# Patient Record
Sex: Male | Born: 1960 | Race: White | Hispanic: No | Marital: Married | Smoking: Never smoker
Health system: Southern US, Community
[De-identification: ages and names within clinical notes are randomized; demographics above are authoritative.]

## PROBLEM LIST (undated history)

## (undated) DIAGNOSIS — F329 Major depressive disorder, single episode, unspecified: Secondary | ICD-10-CM

## (undated) DIAGNOSIS — F32A Depression, unspecified: Secondary | ICD-10-CM

## (undated) DIAGNOSIS — M1990 Unspecified osteoarthritis, unspecified site: Secondary | ICD-10-CM

## (undated) DIAGNOSIS — G4730 Sleep apnea, unspecified: Secondary | ICD-10-CM

## (undated) DIAGNOSIS — E669 Obesity, unspecified: Secondary | ICD-10-CM

## (undated) DIAGNOSIS — I10 Essential (primary) hypertension: Secondary | ICD-10-CM

## (undated) HISTORY — DX: Depression, unspecified: F32.A

## (undated) HISTORY — DX: Essential (primary) hypertension: I10

## (undated) HISTORY — DX: Unspecified osteoarthritis, unspecified site: M19.90

## (undated) HISTORY — DX: Obesity, unspecified: E66.9

## (undated) HISTORY — DX: Major depressive disorder, single episode, unspecified: F32.9

## (undated) HISTORY — DX: Sleep apnea, unspecified: G47.30

---

## 2011-05-26 ENCOUNTER — Other Ambulatory Visit: Payer: Self-pay | Admitting: Family Medicine

## 2011-05-26 DIAGNOSIS — R51 Headache: Secondary | ICD-10-CM

## 2011-05-30 ENCOUNTER — Ambulatory Visit
Admission: RE | Admit: 2011-05-30 | Discharge: 2011-05-30 | Disposition: A | Discharge status: Home / Self Care | Payer: 59 | Source: Ambulatory Visit | Attending: Family Medicine | Admitting: Family Medicine

## 2011-05-30 DIAGNOSIS — R51 Headache: Secondary | ICD-10-CM

## 2011-05-30 MED ORDER — IOHEXOL 300 MG/ML  SOLN
75.0000 mL | Freq: Once | INTRAMUSCULAR | Status: AC | PRN
  Administered 2011-05-30: 75 mL via INTRAVENOUS

## 2011-06-29 ENCOUNTER — Ambulatory Visit: Payer: Self-pay | Admitting: Neurology

## 2011-09-20 ENCOUNTER — Other Ambulatory Visit (HOSPITAL_COMMUNITY): Payer: Self-pay | Admitting: Family Medicine

## 2011-09-20 ENCOUNTER — Other Ambulatory Visit: Payer: Self-pay | Admitting: Family Medicine

## 2011-09-20 DIAGNOSIS — Z9884 Bariatric surgery status: Secondary | ICD-10-CM

## 2011-09-27 ENCOUNTER — Ambulatory Visit (HOSPITAL_COMMUNITY)
Admission: RE | Admit: 2011-09-27 | Discharge: 2011-09-27 | Disposition: A | Discharge status: Home / Self Care | Payer: 59 | Source: Ambulatory Visit | Attending: Family Medicine | Admitting: Family Medicine

## 2011-09-27 ENCOUNTER — Other Ambulatory Visit (HOSPITAL_COMMUNITY): Payer: Self-pay | Admitting: Family Medicine

## 2011-09-27 DIAGNOSIS — R109 Unspecified abdominal pain: Secondary | ICD-10-CM | POA: Insufficient documentation

## 2011-09-27 DIAGNOSIS — Z9884 Bariatric surgery status: Secondary | ICD-10-CM

## 2011-09-27 DIAGNOSIS — R635 Abnormal weight gain: Secondary | ICD-10-CM | POA: Insufficient documentation

## 2012-12-20 ENCOUNTER — Other Ambulatory Visit: Payer: Self-pay | Admitting: Physician Assistant

## 2012-12-20 ENCOUNTER — Ambulatory Visit
Admission: RE | Admit: 2012-12-20 | Discharge: 2012-12-20 | Disposition: A | Discharge status: Home / Self Care | Payer: 59 | Source: Ambulatory Visit | Attending: Physician Assistant | Admitting: Physician Assistant

## 2012-12-20 DIAGNOSIS — R52 Pain, unspecified: Secondary | ICD-10-CM

## 2013-04-02 ENCOUNTER — Ambulatory Visit: Payer: 59 | Admitting: Internal Medicine

## 2013-10-30 IMAGING — CR DG KNEE 1-2V*R*
2 series · 2 of 2 positions shown · non-contrast
Comparison: None.

CLINICAL DATA: Joint pain.  Morbid obesity.

RIGHT KNEE - 1-2 VIEW

[view not recorded (1 of 2)]
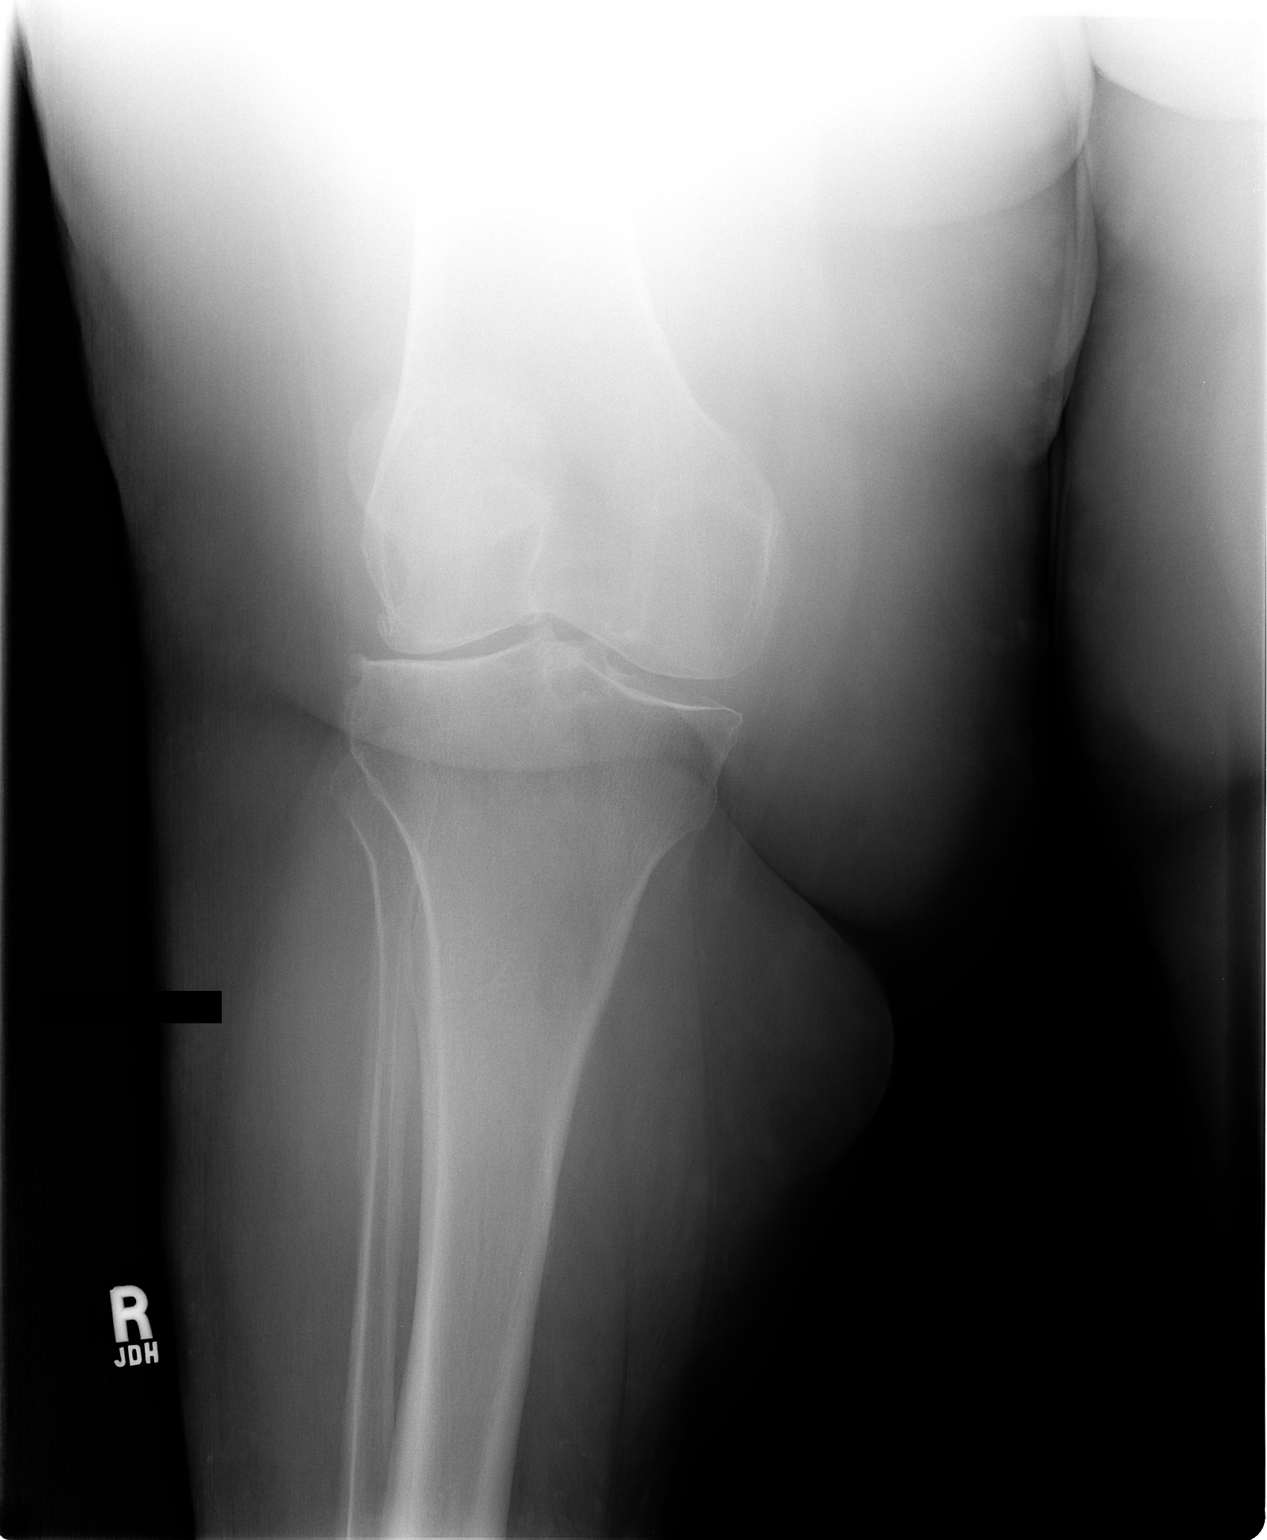

[view not recorded (2 of 2)]
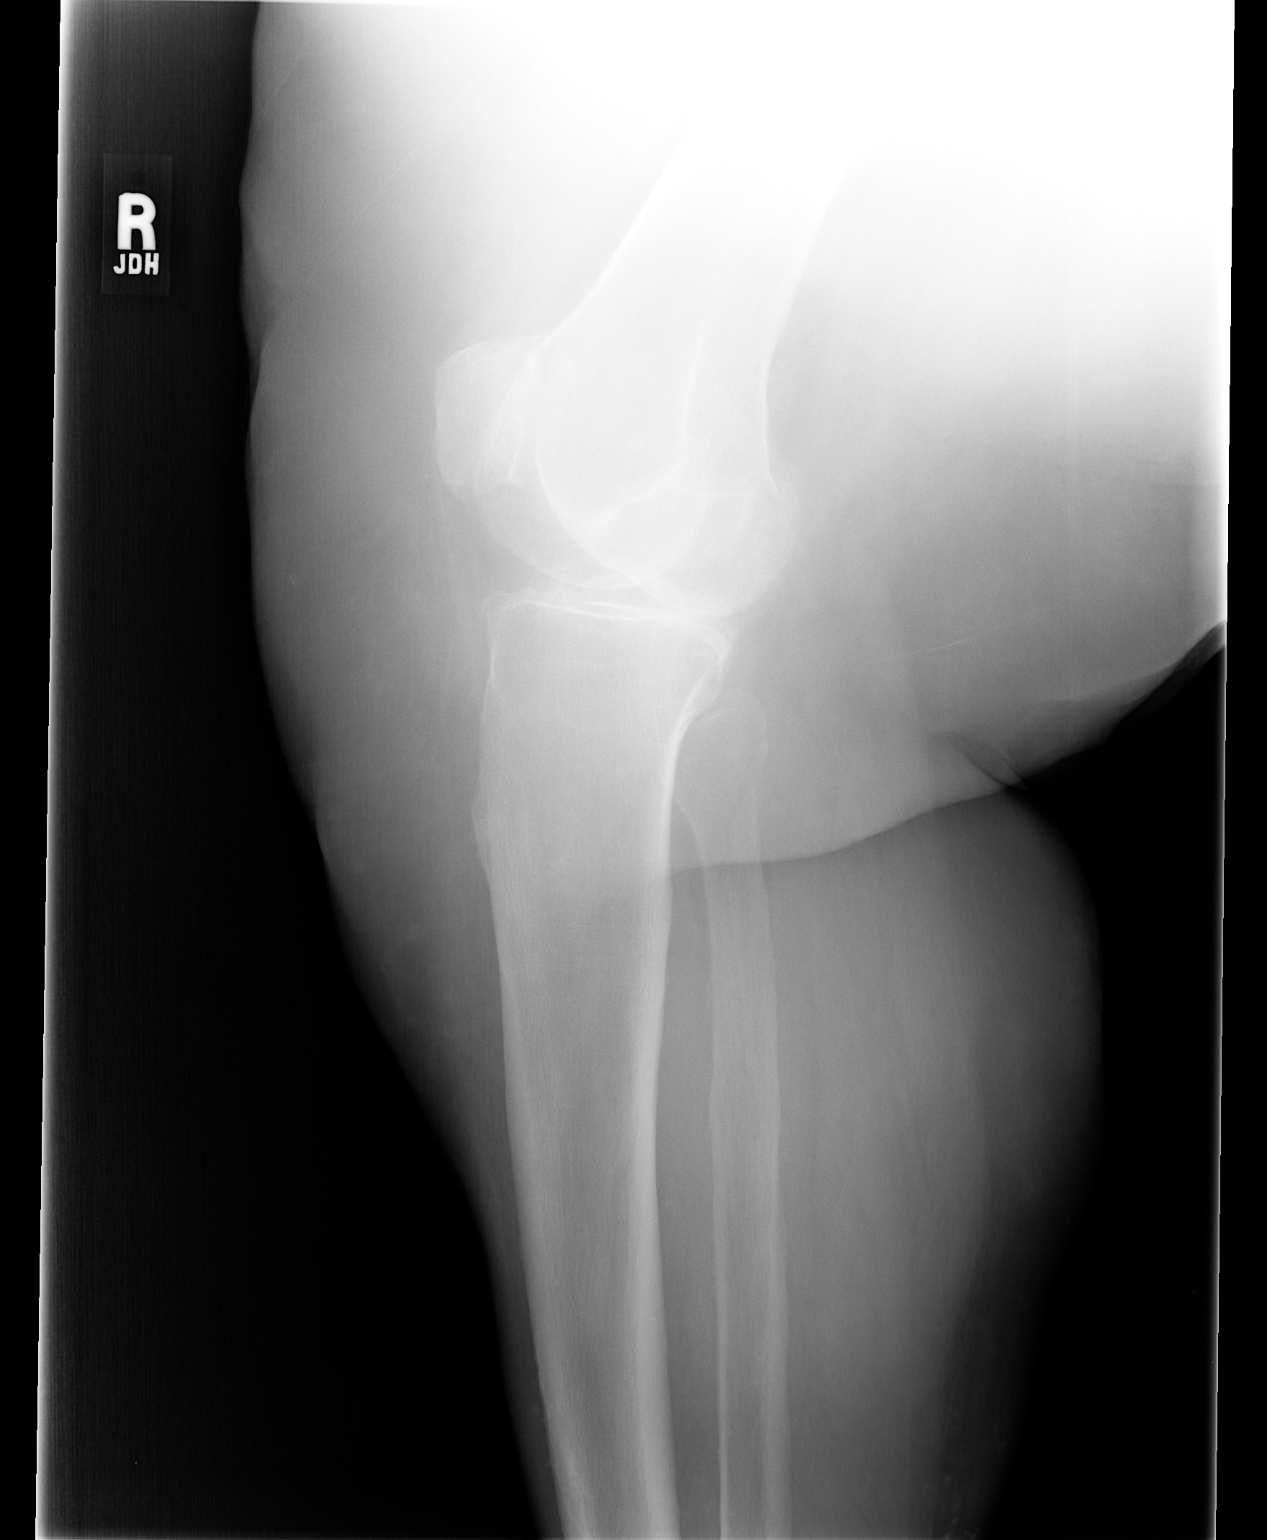

[2 of 2 positions shown; findings below may reference images not displayed]

FINDINGS: Tricompartmental degenerative change.  No visible
effusion.  Increased soft tissue.
IMPRESSION: As above.

## 2013-10-30 IMAGING — CR DG ANKLE 2V *R*
2 series · 2 of 2 positions shown · non-contrast
Comparison: None.

CLINICAL DATA: Major joint pain for months.

RIGHT ANKLE - 2 VIEW

[view not recorded (1 of 2)]
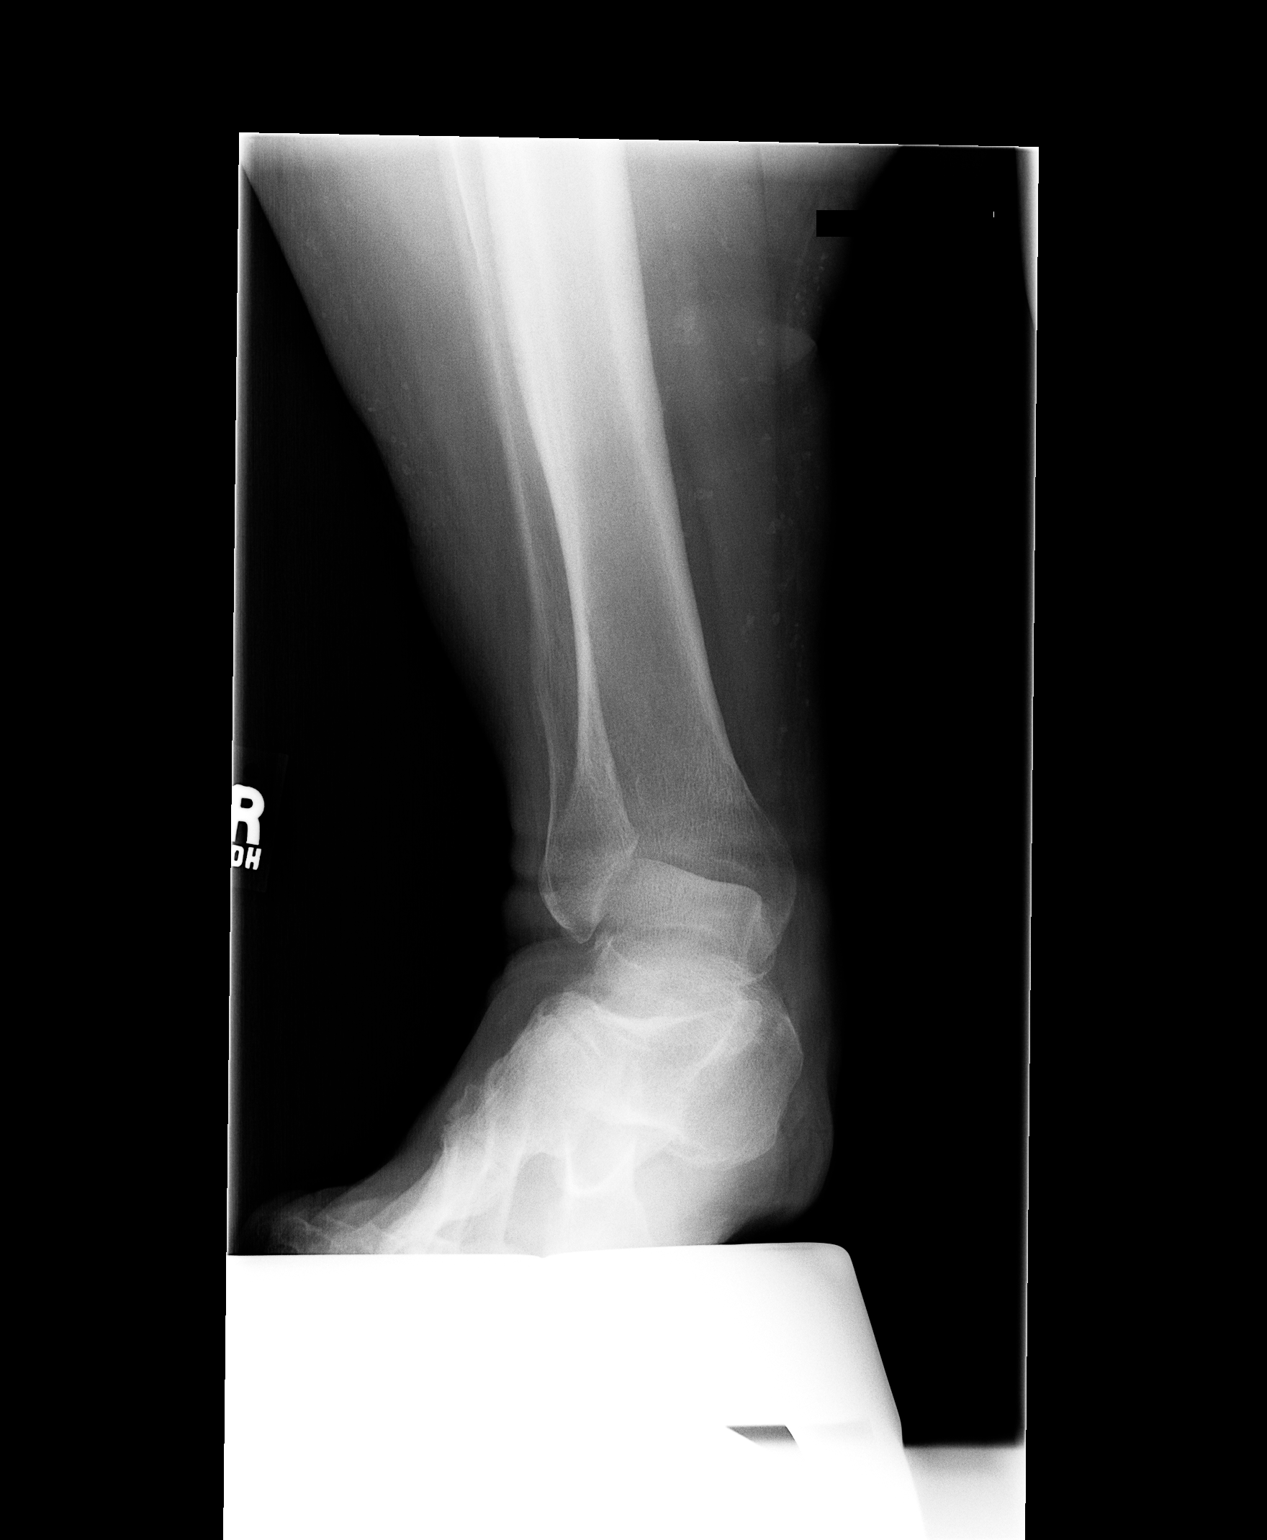

[view not recorded (2 of 2)]
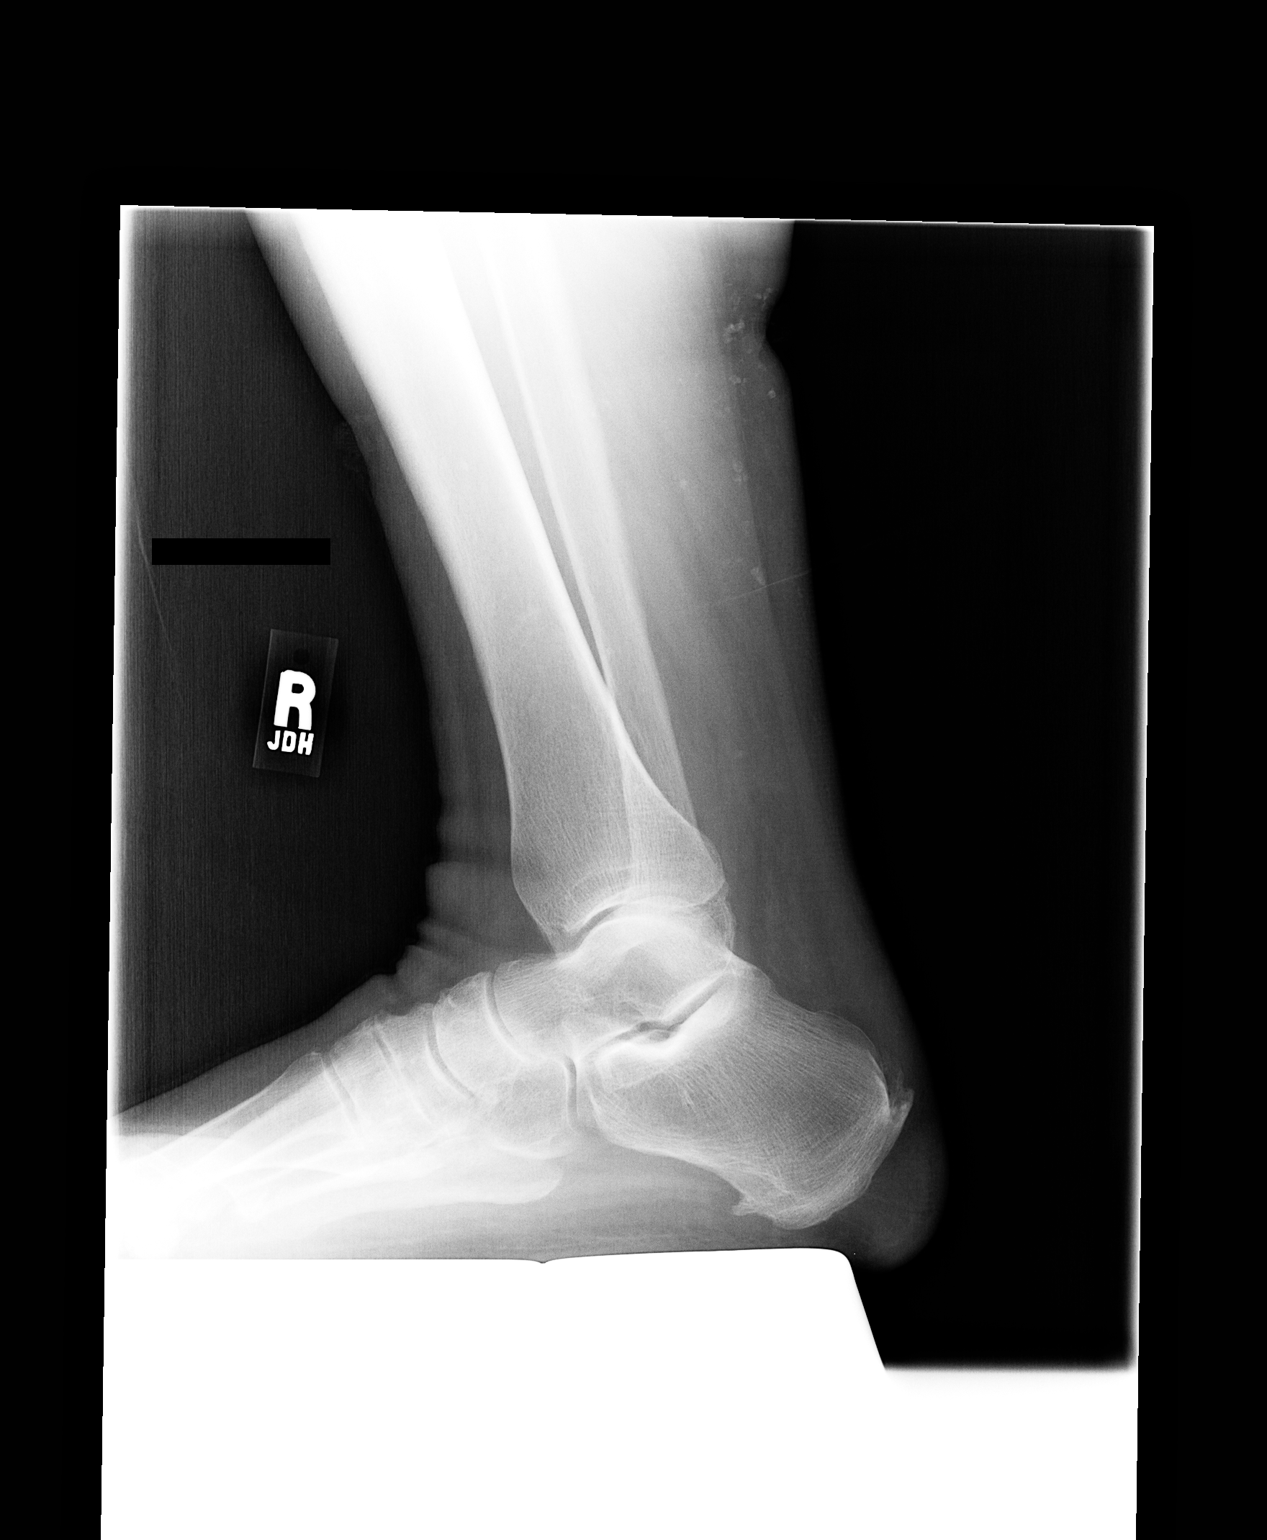

[2 of 2 positions shown; findings below may reference images not displayed]

FINDINGS: Soft tissue calcifications are noted in the calf.  There
is no fracture or dislocation.  There may be mild narrowing of the
tibiotalar joint space.  Calcaneal Achilles and plantar spurs are
noted.
IMPRESSION: No acute findings.

## 2017-01-11 DIAGNOSIS — I10 Essential (primary) hypertension: Secondary | ICD-10-CM | POA: Diagnosis not present

## 2017-01-11 DIAGNOSIS — F324 Major depressive disorder, single episode, in partial remission: Secondary | ICD-10-CM | POA: Diagnosis not present

## 2017-01-11 DIAGNOSIS — M170 Bilateral primary osteoarthritis of knee: Secondary | ICD-10-CM | POA: Diagnosis not present

## 2017-01-11 DIAGNOSIS — E291 Testicular hypofunction: Secondary | ICD-10-CM | POA: Diagnosis not present

## 2017-01-11 DIAGNOSIS — Z6841 Body Mass Index (BMI) 40.0 and over, adult: Secondary | ICD-10-CM | POA: Diagnosis not present

## 2017-01-11 DIAGNOSIS — E538 Deficiency of other specified B group vitamins: Secondary | ICD-10-CM | POA: Diagnosis not present

## 2017-04-07 DIAGNOSIS — G44219 Episodic tension-type headache, not intractable: Secondary | ICD-10-CM | POA: Diagnosis not present

## 2017-04-07 DIAGNOSIS — H40033 Anatomical narrow angle, bilateral: Secondary | ICD-10-CM | POA: Diagnosis not present

## 2017-08-20 DIAGNOSIS — I10 Essential (primary) hypertension: Secondary | ICD-10-CM | POA: Diagnosis not present

## 2017-08-20 DIAGNOSIS — F3181 Bipolar II disorder: Secondary | ICD-10-CM | POA: Diagnosis not present

## 2017-08-20 DIAGNOSIS — E291 Testicular hypofunction: Secondary | ICD-10-CM | POA: Diagnosis not present

## 2017-08-20 DIAGNOSIS — Z6841 Body Mass Index (BMI) 40.0 and over, adult: Secondary | ICD-10-CM | POA: Diagnosis not present

## 2017-08-20 DIAGNOSIS — E538 Deficiency of other specified B group vitamins: Secondary | ICD-10-CM | POA: Diagnosis not present

## 2017-08-20 DIAGNOSIS — Z1211 Encounter for screening for malignant neoplasm of colon: Secondary | ICD-10-CM | POA: Diagnosis not present

## 2017-08-20 DIAGNOSIS — Z0000 Encounter for general adult medical examination without abnormal findings: Secondary | ICD-10-CM | POA: Diagnosis not present

## 2017-08-20 DIAGNOSIS — F324 Major depressive disorder, single episode, in partial remission: Secondary | ICD-10-CM | POA: Diagnosis not present

## 2017-08-20 DIAGNOSIS — D509 Iron deficiency anemia, unspecified: Secondary | ICD-10-CM | POA: Diagnosis not present

## 2017-08-20 DIAGNOSIS — M170 Bilateral primary osteoarthritis of knee: Secondary | ICD-10-CM | POA: Diagnosis not present

## 2017-08-20 DIAGNOSIS — Z125 Encounter for screening for malignant neoplasm of prostate: Secondary | ICD-10-CM | POA: Diagnosis not present

## 2017-09-07 DIAGNOSIS — E291 Testicular hypofunction: Secondary | ICD-10-CM | POA: Diagnosis not present

## 2017-11-05 DIAGNOSIS — Z1211 Encounter for screening for malignant neoplasm of colon: Secondary | ICD-10-CM | POA: Diagnosis not present

## 2017-12-19 DIAGNOSIS — Z9884 Bariatric surgery status: Secondary | ICD-10-CM | POA: Diagnosis not present

## 2017-12-19 DIAGNOSIS — I10 Essential (primary) hypertension: Secondary | ICD-10-CM | POA: Insufficient documentation

## 2017-12-19 DIAGNOSIS — G4733 Obstructive sleep apnea (adult) (pediatric): Secondary | ICD-10-CM | POA: Insufficient documentation

## 2017-12-19 DIAGNOSIS — R399 Unspecified symptoms and signs involving the genitourinary system: Secondary | ICD-10-CM | POA: Diagnosis not present

## 2017-12-19 DIAGNOSIS — Z6841 Body Mass Index (BMI) 40.0 and over, adult: Secondary | ICD-10-CM | POA: Diagnosis not present

## 2017-12-19 DIAGNOSIS — E291 Testicular hypofunction: Secondary | ICD-10-CM | POA: Diagnosis not present

## 2017-12-19 DIAGNOSIS — M170 Bilateral primary osteoarthritis of knee: Secondary | ICD-10-CM | POA: Diagnosis not present

## 2017-12-19 DIAGNOSIS — E66813 Obesity, class 3: Secondary | ICD-10-CM | POA: Insufficient documentation

## 2017-12-19 DIAGNOSIS — F419 Anxiety disorder, unspecified: Secondary | ICD-10-CM | POA: Insufficient documentation

## 2017-12-19 DIAGNOSIS — F331 Major depressive disorder, recurrent, moderate: Secondary | ICD-10-CM | POA: Diagnosis not present

## 2018-01-01 DIAGNOSIS — M179 Osteoarthritis of knee, unspecified: Secondary | ICD-10-CM | POA: Diagnosis not present

## 2018-01-01 DIAGNOSIS — I10 Essential (primary) hypertension: Secondary | ICD-10-CM | POA: Diagnosis not present

## 2018-01-01 DIAGNOSIS — M25569 Pain in unspecified knee: Secondary | ICD-10-CM | POA: Diagnosis not present

## 2018-01-01 DIAGNOSIS — R262 Difficulty in walking, not elsewhere classified: Secondary | ICD-10-CM | POA: Diagnosis not present

## 2018-01-01 DIAGNOSIS — Z723 Lack of physical exercise: Secondary | ICD-10-CM | POA: Diagnosis not present

## 2018-01-01 DIAGNOSIS — Z9884 Bariatric surgery status: Secondary | ICD-10-CM | POA: Diagnosis not present

## 2018-01-01 DIAGNOSIS — G4733 Obstructive sleep apnea (adult) (pediatric): Secondary | ICD-10-CM | POA: Diagnosis not present

## 2018-01-01 DIAGNOSIS — R319 Hematuria, unspecified: Secondary | ICD-10-CM | POA: Diagnosis not present

## 2018-01-04 DIAGNOSIS — R1013 Epigastric pain: Secondary | ICD-10-CM | POA: Insufficient documentation

## 2018-01-04 DIAGNOSIS — M1990 Unspecified osteoarthritis, unspecified site: Secondary | ICD-10-CM | POA: Diagnosis not present

## 2018-01-04 DIAGNOSIS — I10 Essential (primary) hypertension: Secondary | ICD-10-CM | POA: Diagnosis not present

## 2018-01-04 DIAGNOSIS — Z9884 Bariatric surgery status: Secondary | ICD-10-CM | POA: Insufficient documentation

## 2018-01-04 DIAGNOSIS — K219 Gastro-esophageal reflux disease without esophagitis: Secondary | ICD-10-CM | POA: Insufficient documentation

## 2018-01-04 DIAGNOSIS — R635 Abnormal weight gain: Secondary | ICD-10-CM | POA: Diagnosis not present

## 2018-01-04 DIAGNOSIS — F329 Major depressive disorder, single episode, unspecified: Secondary | ICD-10-CM | POA: Diagnosis not present

## 2018-01-04 DIAGNOSIS — G4733 Obstructive sleep apnea (adult) (pediatric): Secondary | ICD-10-CM | POA: Diagnosis not present

## 2018-01-04 DIAGNOSIS — E46 Unspecified protein-calorie malnutrition: Secondary | ICD-10-CM | POA: Diagnosis not present

## 2018-01-04 DIAGNOSIS — Z9989 Dependence on other enabling machines and devices: Secondary | ICD-10-CM | POA: Diagnosis not present

## 2018-01-04 DIAGNOSIS — Z6841 Body Mass Index (BMI) 40.0 and over, adult: Secondary | ICD-10-CM | POA: Diagnosis not present

## 2018-04-03 DIAGNOSIS — F331 Major depressive disorder, recurrent, moderate: Secondary | ICD-10-CM | POA: Diagnosis not present

## 2018-04-03 DIAGNOSIS — Z6841 Body Mass Index (BMI) 40.0 and over, adult: Secondary | ICD-10-CM | POA: Diagnosis not present

## 2018-04-03 DIAGNOSIS — I10 Essential (primary) hypertension: Secondary | ICD-10-CM | POA: Diagnosis not present

## 2018-04-03 DIAGNOSIS — Z9884 Bariatric surgery status: Secondary | ICD-10-CM | POA: Diagnosis not present

## 2018-04-03 DIAGNOSIS — G4733 Obstructive sleep apnea (adult) (pediatric): Secondary | ICD-10-CM | POA: Diagnosis not present

## 2018-04-03 DIAGNOSIS — R399 Unspecified symptoms and signs involving the genitourinary system: Secondary | ICD-10-CM | POA: Diagnosis not present

## 2018-07-17 DIAGNOSIS — I10 Essential (primary) hypertension: Secondary | ICD-10-CM | POA: Diagnosis not present

## 2018-07-17 DIAGNOSIS — Z9884 Bariatric surgery status: Secondary | ICD-10-CM | POA: Diagnosis not present

## 2018-07-17 DIAGNOSIS — R600 Localized edema: Secondary | ICD-10-CM | POA: Diagnosis not present

## 2018-07-17 DIAGNOSIS — R399 Unspecified symptoms and signs involving the genitourinary system: Secondary | ICD-10-CM | POA: Diagnosis not present

## 2018-07-17 DIAGNOSIS — F331 Major depressive disorder, recurrent, moderate: Secondary | ICD-10-CM | POA: Diagnosis not present

## 2018-07-17 DIAGNOSIS — E291 Testicular hypofunction: Secondary | ICD-10-CM | POA: Diagnosis not present

## 2019-02-25 DIAGNOSIS — Z9884 Bariatric surgery status: Secondary | ICD-10-CM | POA: Diagnosis not present

## 2019-02-25 DIAGNOSIS — F332 Major depressive disorder, recurrent severe without psychotic features: Secondary | ICD-10-CM | POA: Diagnosis not present

## 2019-02-25 DIAGNOSIS — E46 Unspecified protein-calorie malnutrition: Secondary | ICD-10-CM | POA: Diagnosis not present

## 2019-02-25 DIAGNOSIS — K912 Postsurgical malabsorption, not elsewhere classified: Secondary | ICD-10-CM | POA: Diagnosis not present

## 2019-02-25 DIAGNOSIS — I10 Essential (primary) hypertension: Secondary | ICD-10-CM | POA: Diagnosis not present

## 2019-02-25 DIAGNOSIS — Z6841 Body Mass Index (BMI) 40.0 and over, adult: Secondary | ICD-10-CM | POA: Diagnosis not present

## 2019-03-19 DIAGNOSIS — Z6841 Body Mass Index (BMI) 40.0 and over, adult: Secondary | ICD-10-CM | POA: Diagnosis not present

## 2019-03-19 DIAGNOSIS — L608 Other nail disorders: Secondary | ICD-10-CM | POA: Diagnosis not present

## 2019-03-31 DIAGNOSIS — I10 Essential (primary) hypertension: Secondary | ICD-10-CM | POA: Diagnosis not present

## 2019-03-31 DIAGNOSIS — F332 Major depressive disorder, recurrent severe without psychotic features: Secondary | ICD-10-CM | POA: Diagnosis not present

## 2019-04-21 DIAGNOSIS — F331 Major depressive disorder, recurrent, moderate: Secondary | ICD-10-CM | POA: Diagnosis not present

## 2019-04-21 DIAGNOSIS — R413 Other amnesia: Secondary | ICD-10-CM | POA: Diagnosis not present

## 2019-05-22 DIAGNOSIS — F330 Major depressive disorder, recurrent, mild: Secondary | ICD-10-CM | POA: Diagnosis not present

## 2019-12-16 DIAGNOSIS — D472 Monoclonal gammopathy: Secondary | ICD-10-CM | POA: Insufficient documentation

## 2019-12-31 DIAGNOSIS — K580 Irritable bowel syndrome with diarrhea: Secondary | ICD-10-CM | POA: Insufficient documentation

## 2020-12-25 DIAGNOSIS — R7989 Other specified abnormal findings of blood chemistry: Secondary | ICD-10-CM | POA: Insufficient documentation

## 2020-12-25 DIAGNOSIS — G8929 Other chronic pain: Secondary | ICD-10-CM | POA: Insufficient documentation

## 2022-01-11 DIAGNOSIS — D696 Thrombocytopenia, unspecified: Secondary | ICD-10-CM | POA: Insufficient documentation

## 2022-06-08 ENCOUNTER — Encounter: Payer: Self-pay | Admitting: Family Medicine

## 2022-11-08 ENCOUNTER — Inpatient Hospital Stay: Payer: Medicare Other | Attending: Hematology | Admitting: Hematology

## 2022-11-08 ENCOUNTER — Inpatient Hospital Stay: Payer: Medicare Other

## 2022-11-08 ENCOUNTER — Telehealth: Payer: Self-pay | Admitting: Hematology

## 2022-11-08 ENCOUNTER — Other Ambulatory Visit: Payer: Self-pay

## 2022-11-08 VITALS — BP 137/82 | HR 102 | Temp 97.6°F | Resp 18 | Wt >= 6400 oz

## 2022-11-08 DIAGNOSIS — Z9884 Bariatric surgery status: Secondary | ICD-10-CM | POA: Diagnosis not present

## 2022-11-08 DIAGNOSIS — E538 Deficiency of other specified B group vitamins: Secondary | ICD-10-CM

## 2022-11-08 DIAGNOSIS — D649 Anemia, unspecified: Secondary | ICD-10-CM

## 2022-11-08 DIAGNOSIS — D472 Monoclonal gammopathy: Secondary | ICD-10-CM

## 2022-11-08 DIAGNOSIS — E611 Iron deficiency: Secondary | ICD-10-CM | POA: Diagnosis not present

## 2022-11-08 DIAGNOSIS — Z8052 Family history of malignant neoplasm of bladder: Secondary | ICD-10-CM | POA: Insufficient documentation

## 2022-11-08 DIAGNOSIS — D72829 Elevated white blood cell count, unspecified: Secondary | ICD-10-CM | POA: Insufficient documentation

## 2022-11-08 LAB — CBC WITH DIFFERENTIAL (CANCER CENTER ONLY)
Abs Immature Granulocytes: 0.05 10*3/uL (ref 0.00–0.07)
Basophils Absolute: 0.1 10*3/uL (ref 0.0–0.1)
Basophils Relative: 1 %
Eosinophils Absolute: 0.2 10*3/uL (ref 0.0–0.5)
Eosinophils Relative: 1 %
HCT: 41.8 % (ref 39.0–52.0)
Hemoglobin: 14.2 g/dL (ref 13.0–17.0)
Immature Granulocytes: 0 %
Lymphocytes Relative: 23 %
Lymphs Abs: 3.3 10*3/uL (ref 0.7–4.0)
MCH: 31.1 pg (ref 26.0–34.0)
MCHC: 34 g/dL (ref 30.0–36.0)
MCV: 91.5 fL (ref 80.0–100.0)
Monocytes Absolute: 1.3 10*3/uL — ABNORMAL HIGH (ref 0.1–1.0)
Monocytes Relative: 9 %
Neutro Abs: 9.6 10*3/uL — ABNORMAL HIGH (ref 1.7–7.7)
Neutrophils Relative %: 66 %
Platelet Count: 409 10*3/uL — ABNORMAL HIGH (ref 150–400)
RBC: 4.57 MIL/uL (ref 4.22–5.81)
RDW: 13.8 % (ref 11.5–15.5)
WBC Count: 14.5 10*3/uL — ABNORMAL HIGH (ref 4.0–10.5)
nRBC: 0 % (ref 0.0–0.2)

## 2022-11-08 LAB — CMP (CANCER CENTER ONLY)
ALT: 14 U/L (ref 0–44)
AST: 14 U/L — ABNORMAL LOW (ref 15–41)
Albumin: 3.7 g/dL (ref 3.5–5.0)
Alkaline Phosphatase: 91 U/L (ref 38–126)
Anion gap: 10 (ref 5–15)
BUN: 16 mg/dL (ref 8–23)
CO2: 24 mmol/L (ref 22–32)
Calcium: 8.9 mg/dL (ref 8.9–10.3)
Chloride: 105 mmol/L (ref 98–111)
Creatinine: 0.67 mg/dL (ref 0.61–1.24)
GFR, Estimated: >60 mL/min (ref >60)
Glucose, Bld: 123 mg/dL — ABNORMAL HIGH (ref 70–99)
Potassium: 4.3 mmol/L (ref 3.5–5.1)
Sodium: 139 mmol/L (ref 135–145)
Total Bilirubin: 0.3 mg/dL (ref 0.3–1.2)
Total Protein: 7.1 g/dL (ref 6.5–8.1)

## 2022-11-08 LAB — IRON AND IRON BINDING CAPACITY (CC-WL,HP ONLY)
Iron: 41 ug/dL — ABNORMAL LOW (ref 45–182)
Saturation Ratios: 13 % — ABNORMAL LOW (ref 17.9–39.5)
TIBC: 315 ug/dL (ref 250–450)
UIBC: 274 ug/dL (ref 117–376)

## 2022-11-08 LAB — VITAMIN B12: Vitamin B-12: 840 pg/mL (ref 180–914)

## 2022-11-08 LAB — LACTATE DEHYDROGENASE: LDH: 140 U/L (ref 98–192)

## 2022-11-08 NOTE — Progress Notes (Signed)
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  HISTORY OF PRESENTING ILLNESS:   Cody Hampton is a wonderful 62 y.o. 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  Today, he reports that he has endorsed an elevated high white blood count for 10-20 years. He reports that he has not had any myeloma panel testing since 2021. He denies any symptoms that triggered his referral to hematology. He reports that it has been 22 years since his gastric bypass surgery.   Patient is vitamin B12 deficient due to gastric bypass surgery. He regularly takes vitamin B and iron supplements. Patient regularly takes iron supplements but does not take any separate iron. Patient has been steadily losing weight for at least  the past 1.5 years.   He reports that he has not been able to receive B12 injections because his wife, who used to administer it, has been sick with uterine cancer. He has not received B12 injections for 1.5 years.   In regards to family history, he reports that his father had bladder cancer and was not a smoker. Patient denies any history of smoking. He also reports that two of his sisters have cancer, but is unsure of the type. He notes that one of his sisters endorses a lymph node cancer.   He complains of frequent fatigue and uses CPAP daily. He also complains of knee pain and occasional bone pain. He denies any change in bowel habits, abdominal pain, or new leg swelling.  He reports that his blood sugar levels have been stable and he continues to be on Celexa for anxiety/depression symptoms.  MEDICAL HISTORY:  Past Medical History:  Diagnosis  Date   Arthritis    in knees   Depression    Hypertension    Obesity    Severe sleep apnea   IDA B12 deficicency  SURGICAL HISTORY: Gastric bypass surgery   SOCIAL HISTORY: Social History   Socioeconomic History   Marital status: Married    Spouse name: Not on file   Number of children: Not on file   Years of education: Not on file   Highest education level: Not on file  Occupational History   Not on file  Tobacco Use   Smoking status: Not on file   Smokeless tobacco: Not on file  Substance and Sexual Activity   Alcohol use: Not on file   Drug use: Not on file   Sexual activity: Not on file  Other Topics Concern   Not on file  Social History Narrative   Not on file   Social Determinants of Health   Financial Resource Strain: Not on file  Food Insecurity: Not on file  Transportation Needs: Not on file  Physical Activity: Not on file  Stress: Not on file  Social Connections: Not on file  Intimate Partner Violence: Not on file    FAMILY HISTORY: eports that his father had bladder cancer and was not a smoker. Patient denies any history of smoking. He also reports that two of his sisters have cancer, but is unsure of the type. He notes that one of his sisters endorses a lymph node cancer.   ALLERGIES:  has No Known Allergies.  MEDICATIONS:  Current Outpatient Medications  Medication Sig Dispense Refill   Calcium Carbonate-Vitamin D (CALCIUM-VITAMIN D) 500-200 MG-UNIT per tablet Take 1 tablet by mouth daily.     carbonyl iron (FEOSOL) 45 MG TABS tablet Take 45 mg by mouth. As directed     Cyanocobalamin (VITAMIN B-12 IJ) Inject as directed. ytake 1 injection weekly     diclofenac (VOLTAREN) 75 MG EC tablet Take 75 mg by mouth 2 (two) times daily.     diphenoxylate-atropine (LOMOTIL) 2.5-0.025 MG/5ML liquid Take by mouth 4 (four) times daily as needed for diarrhea or loose stools.     lisinopril-hydrochlorothiazide (PRINZIDE,ZESTORETIC) 20-12.5 MG per tablet Take  1 tablet by mouth daily.     Multiple Vitamin (MULTIVITAMIN) tablet Take 1 tablet by mouth daily.     NON FORMULARY fortesta Gel pumps once a day     ondansetron (ZOFRAN) 8 MG tablet Take by mouth every 8 (eight) hours as needed for nausea or vomiting.     polycarbophil (FIBERCON) 625 MG tablet Take 625 mg by mouth daily.     potassium chloride (K-DUR) 10 MEQ tablet Take 10 mEq by mouth daily.     traMADol-acetaminophen (ULTRACET) 37.5-325 MG per tablet Take 1 tablet by mouth every 6 (six) hours as needed. 1-2 tabs prn     No current facility-administered medications for this visit.    REVIEW OF SYSTEMS:    10 Point review of Systems was done is negative except as noted above.  PHYSICAL EXAMINATION: ECOG PERFORMANCE STATUS: 3 - Symptomatic, >50% confined to bed  .There were no vitals filed for this visit. There were no vitals filed for this visit. .There is no height or weight on file to calculate BMI.  GENERAL:alert, in no acute distress and comfortable SKIN: no acute rashes, no significant lesions EYES: conjunctiva are pink and non-injected, sclera anicteric OROPHARYNX: MMM, no exudates, no oropharyngeal erythema or ulceration NECK: supple, no JVD LYMPH:  no palpable lymphadenopathy in the cervical, axillary or inguinal regions LUNGS: clear to auscultation b/l with normal respiratory effort HEART: regular rate & rhythm ABDOMEN:  normoactive bowel sounds , non tender, not distended. Extremity: no pedal edema PSYCH: alert & oriented x 3 with fluent speech NEURO: no focal motor/sensory deficits  LABORATORY DATA:  I have reviewed the data as listed  .     No data to display          .     No data to display          CMP 05/26/2022:   PSA Screening 05/26/2022:     CBC and Differential 05/26/2022:       RADIOGRAPHIC STUDIES: I have personally reviewed the radiological images as listed and agreed with the findings in the report. No results  found.  ASSESSMENT & PLAN:   Wonderful 63 y.o. male with:  1. MGUS (monoclonal gammopathy of unknown significance) 2. Leukocytosis, unspecified typeEssential hypertension 3. Anxiety and depression 4. Morbid obesity 5. OSA on CPAP 6.Iron deficiency and B12 deficiency related to Gastric bypass surgery  PLAN:  -Discussed lab results from 06/05/2022 in detail with patient. CBC showed WBC of 11.9K, hemoglobin of 14.7, and platelets of 374K. -Patient is fairly iron and B12 deficient due to gastric bypass surgery  -continue vitamin B and iron supplements.  -2021 M spike 0.25g -Dicussed details of MGUS with patient -discussed parameters that can indicate concern for progression to multiple myeloma -discussed criteria such as changes in kidney function, elevated calcium, or bone pain due to bone tumors, which  patient does not endorse at this time -discussed option of liquid B12 sublingual -recommended patient to consume a well-balanced diet and to drink at least 2L of water daily -will order blood tests for further evaluation -will order whole body X-ray to rule out any signs of bone tumors. If X-ray results are normal, would not subject patient to bone marrow biopsy  . Orders Placed This Encounter  Procedures   DG Bone Survey Met    Standing Status:   Future    Standing Expiration Date:   02/08/2023    Order Specific Question:   Reason for Exam (SYMPTOM  OR DIAGNOSIS REQUIRED)    Answer:   staging monoclonal paraproteinemia    Order Specific Question:   Preferred imaging location?    Answer:   [facility]   CBC with Differential (Cancer Center Only)    Standing Status:   Future    Number of Occurrences:   1    Standing Expiration Date:   11/08/2023   CMP (Cancer Center only)    Standing Status:   Future    Number of Occurrences:   1    Standing Expiration Date:   11/08/2023   Multiple Myeloma Panel (SPEP&IFE w/QIG)    Standing Status:   Future    Number of Occurrences:   1     Standing Expiration Date:   11/08/2023   Kappa/lambda light chains    Standing Status:   Future    Number of Occurrences:   1    Standing Expiration Date:   11/08/2023   Lactate dehydrogenase    Standing Status:   Future    Number of Occurrences:   1    Standing Expiration Date:   11/08/2023   Beta 2 microglobulin    Standing Status:   Future    Number of Occurrences:   1    Standing Expiration Date:   11/08/2023   Vitamin B12    Standing Status:   Future    Number of Occurrences:   1    Standing Expiration Date:   11/08/2023   Ferritin    Standing Status:   Future    Number of Occurrences:   1    Standing Expiration Date:   11/08/2023   Iron and Iron Binding Capacity ([facility] only)    Standing Status:   Future    Number of Occurrences:   1    Standing Expiration Date:   11/08/2023    FOLLOW-UP: Labs today WHole body bone survey in about 1 week Phone visit with Dr Candise Che in 2 weeks  The total time spent in the appointment was 60 minutes* .  All of the patient's questions were answered with apparent satisfaction. The patient knows to call the clinic with any problems, questions or concerns.   Wyvonnia Lora MD MS AAHIVMS [facility] Hematology/Oncology Physician [facility]  .*Total Encounter Time as defined by the Centers for Medicare and Medicaid Services includes, in addition to the face-to-face time of a patient visit (documented in the note above) non-face-to-face time: obtaining and reviewing outside history, ordering and reviewing medications, tests or procedures, care coordination (communications with other health care professionals or caregivers) and documentation in the medical record.   I,Mitra Faeizi,acting as a Neurosurgeon for Wyvonnia Lora, MD.,have documented all relevant documentation on the behalf of Wyvonnia Lora, MD,as directed by  Wyvonnia Lora, MD while in the presence of Wyvonnia Lora, MD.  .I have reviewed the above documentation  for accuracy and completeness, and  I agree with the above. Johney Maine MD

## 2022-11-09 LAB — KAPPA/LAMBDA LIGHT CHAINS
Kappa free light chain: 71.7 mg/L — ABNORMAL HIGH (ref 3.3–19.4)
Kappa, lambda light chain ratio: 2.8 — ABNORMAL HIGH (ref 0.26–1.65)
Lambda free light chains: 25.6 mg/L (ref 5.7–26.3)

## 2022-11-09 LAB — BETA 2 MICROGLOBULIN, SERUM: Beta-2 Microglobulin: 2.9 mg/L — ABNORMAL HIGH (ref 0.6–2.4)

## 2022-11-09 LAB — FERRITIN: Ferritin: 184 ng/mL (ref 24–336)

## 2022-11-13 LAB — MULTIPLE MYELOMA PANEL, SERUM
Albumin SerPl Elph-Mcnc: 3.1 g/dL (ref 2.9–4.4)
Albumin/Glob SerPl: 0.9 (ref 0.7–1.7)
Alpha 1: 0.3 g/dL (ref 0.0–0.4)
Alpha2 Glob SerPl Elph-Mcnc: 1 g/dL (ref 0.4–1.0)
B-Globulin SerPl Elph-Mcnc: 1.6 g/dL — ABNORMAL HIGH (ref 0.7–1.3)
Gamma Glob SerPl Elph-Mcnc: 0.9 g/dL (ref 0.4–1.8)
Globulin, Total: 3.7 g/dL (ref 2.2–3.9)
IgA: 952 mg/dL — ABNORMAL HIGH (ref 61–437)
IgG (Immunoglobin G), Serum: 806 mg/dL (ref 603–1613)
IgM (Immunoglobulin M), Srm: 18 mg/dL — ABNORMAL LOW (ref 20–172)
M Protein SerPl Elph-Mcnc: 0.3 g/dL — ABNORMAL HIGH
Total Protein ELP: 6.8 g/dL (ref 6.0–8.5)

## 2022-11-15 NOTE — Progress Notes (Incomplete)
  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  HISTORY OF PRESENTING ILLNESS:  Cody Hampton is a wonderful 61 y.o. 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  Today, he reports that he has endorsed an elevated high white blood count for 10-20 years. He reports that he has not had any myeloma panel testing since 2021. He denies any symptoms that triggered his referral to hematology. He reports that it has been 22 years since his gastric bypass surgery.   Patient is vitamin B12 deficient due to gastric bypass surgery. He regularly takes vitamin B and iron supplements. Patient regularly takes iron supplements but does not take any separate iron. Patient has been steadily losing weight for at least  the past 1.5 years.   He reports that he has not been able to receive B12 injections because his wife, who used to administer it, has been sick with uterine cancer. He has not received B12 injections for 1.5 years.   In regards to family history, he reports that his father had bladder cancer and was not a smoker. Patient denies any history of smoking. He also reports that two of his sisters have cancer, but is unsure of the type. He notes that one of his sisters endorses a lymph node cancer.   He complains of frequent fatigue and uses CPAP daily. He also complains of knee pain and occasional bone pain. He denies any change in bowel habits, abdominal pain, or new leg swelling.  He reports that his blood sugar levels have been stable and he continues to be on Celexa for anxiety/depression symptoms.  MEDICAL HISTORY:  Past Medical History:  Diagnosis Date    Arthritis    in knees   Depression    Hypertension    Obesity    Severe sleep apnea     SURGICAL HISTORY: *** The histories are not reviewed yet. Please review them in the "History" navigator section and refresh this SmartLink.  SOCIAL HISTORY: Social History   Socioeconomic History   Marital status: Married    Spouse name: Not on file   Number of children: Not on file   Years of education: Not on file   Highest education level: Not on file  Occupational History   Not on file  Tobacco Use   Smoking status: Not on file   Smokeless tobacco: Not on file  Substance and Sexual Activity   Alcohol use: Not on file   Drug use: Not on file   Sexual activity: Not on file  Other Topics Concern   Not on file  Social History Narrative   Not on file   Social Determinants of Health   Financial Resource Strain: Not on file  Food Insecurity: Not on file  Transportation Needs: Not on file  Physical Activity: Not on file  Stress: Not on file  Social Connections: Not on file  Intimate Partner Violence: Not on file    FAMILY HISTORY: No family history on file.  ALLERGIES:  has No Known Allergies.  MEDICATIONS:  Current Outpatient Medications  Medication Sig Dispense Refill   Calcium Carbonate-Vitamin D (CALCIUM-VITAMIN D) 500-200 MG-UNIT per tablet Take 1 tablet by mouth daily.     carbonyl iron (FEOSOL) 45 MG   TABS tablet Take 45 mg by mouth. As directed     Cyanocobalamin (VITAMIN B-12 IJ) Inject as directed. ytake 1 injection weekly     diclofenac (VOLTAREN) 75 MG EC tablet Take 75 mg by mouth 2 (two) times daily.     diphenoxylate-atropine (LOMOTIL) 2.5-0.025 MG/5ML liquid Take by mouth 4 (four) times daily as needed for diarrhea or loose stools.     lisinopril-hydrochlorothiazide (PRINZIDE,ZESTORETIC) 20-12.5 MG per tablet Take 1 tablet by mouth daily.     Multiple Vitamin (MULTIVITAMIN) tablet Take 1 tablet by mouth daily.     NON FORMULARY fortesta Gel pumps once a day      ondansetron (ZOFRAN) 8 MG tablet Take by mouth every 8 (eight) hours as needed for nausea or vomiting.     polycarbophil (FIBERCON) 625 MG tablet Take 625 mg by mouth daily.     potassium chloride (K-DUR) 10 MEQ tablet Take 10 mEq by mouth daily.     traMADol-acetaminophen (ULTRACET) 37.5-325 MG per tablet Take 1 tablet by mouth every 6 (six) hours as needed. 1-2 tabs prn     No current facility-administered medications for this visit.    REVIEW OF SYSTEMS:    10 Point review of Systems was done is negative except as noted above.  PHYSICAL EXAMINATION: ECOG PERFORMANCE STATUS: {CHL ONC ECOG PS:[id number]}  .There were no vitals filed for this visit. There were no vitals filed for this visit. .There is no height or weight on file to calculate BMI.  GENERAL:alert, in no acute distress and comfortable SKIN: no acute rashes, no significant lesions EYES: conjunctiva are pink and non-injected, sclera anicteric OROPHARYNX: MMM, no exudates, no oropharyngeal erythema or ulceration NECK: supple, no JVD LYMPH:  no palpable lymphadenopathy in the cervical, axillary or inguinal regions LUNGS: clear to auscultation b/l with normal respiratory effort HEART: regular rate & rhythm ABDOMEN:  normoactive bowel sounds , non tender, not distended. Extremity: no pedal edema PSYCH: alert & oriented x 3 with fluent speech NEURO: no focal motor/sensory deficits  LABORATORY DATA:  I have reviewed the data as listed  .     No data to display          .     No data to display          CMP 05/26/2022:   PSA Screening 05/26/2022:     CBC and Differential 05/26/2022:       RADIOGRAPHIC STUDIES: I have personally reviewed the radiological images as listed and agreed with the findings in the report. No results found.  ASSESSMENT & PLAN:   Wonderful 61 y.o. male with:  1. Essential hypertension 2. Anxiety and depression 3. Morbid obesity 4. OSA on CPAP 5. MGUS  (monoclonal gammopathy of unknown significance) 10. Leukocytosis, unspecified type  PLAN:  -Discussed lab results from 06/05/2022 in detail with patient. CBC showed WBC of 11.9K, hemoglobin of 14.7, and platelets of 374K. -Patient is fairly iron and B12 deficient due to gastric bypass surgery  -continue vitamin B and iron supplements.  -2021 M spike 0.25g -Dicussed details of MGUS with patient -discussed possible concern for progression to multiple myeloma -discussed criteria such as changes in kidney function, elevated calcium, or bone pain due to bone tumors, which patient does not endorse at this time -discussed option of liquid B12 sublingual -recommended patient to consume a well-balanced diet and to drink at least 2L of water daily -will order blood tests for further evaluation -will order whole body X-ray to rule out   any signs of bone tumors. If X-ray results are normal, would not subject patient to bone marrow biopsy  FOLLOW-UP: Labs today WHole body bone survey in about 1 week Phone visit with Dr Odie Edmonds in 2 weeks  The total time spent in the appointment was *** minutes* .  All of the patient's questions were answered with apparent satisfaction. The patient knows to call the clinic with any problems, questions or concerns.   Audryana Hockenberry MD MS AAHIVMS [facility] Hematology/Oncology Physician [facility]  .*Total Encounter Time as defined by the Centers for Medicare and Medicaid Services includes, in addition to the face-to-face time of a patient visit (documented in the note above) non-face-to-face time: obtaining and reviewing outside history, ordering and reviewing medications, tests or procedures, care coordination (communications with other health care professionals or caregivers) and documentation in the medical record.   I,Mitra Faeizi,acting as a scribe for Maryan Sivak, MD.,have documented all relevant documentation on the behalf of Dominique Calvey, MD,as  directed by  Efe Fazzino, MD while in the presence of Maykayla Highley, MD.  ***  

## 2022-11-28 ENCOUNTER — Inpatient Hospital Stay: Payer: Medicare Other | Admitting: Hematology

## 2022-11-30 ENCOUNTER — Telehealth: Payer: Self-pay | Admitting: Hematology

## 2022-12-06 ENCOUNTER — Ambulatory Visit (HOSPITAL_COMMUNITY)
Admission: RE | Admit: 2022-12-06 | Discharge: 2022-12-06 | Disposition: A | Discharge status: Home / Self Care | Payer: Medicare Other | Source: Ambulatory Visit | Attending: Hematology | Admitting: Hematology

## 2022-12-06 DIAGNOSIS — D472 Monoclonal gammopathy: Secondary | ICD-10-CM | POA: Diagnosis present

## 2022-12-25 ENCOUNTER — Inpatient Hospital Stay: Payer: Medicare Other | Attending: Hematology | Admitting: Hematology

## 2022-12-25 DIAGNOSIS — E538 Deficiency of other specified B group vitamins: Secondary | ICD-10-CM | POA: Diagnosis not present

## 2022-12-25 DIAGNOSIS — D472 Monoclonal gammopathy: Secondary | ICD-10-CM | POA: Insufficient documentation

## 2022-12-25 DIAGNOSIS — Z9884 Bariatric surgery status: Secondary | ICD-10-CM | POA: Diagnosis not present

## 2022-12-25 MED ORDER — POLYSACCHARIDE IRON COMPLEX 150 MG PO CAPS: 150.0000 mg | ORAL_CAPSULE | Freq: Every day | ORAL | 2 refills | Status: DC

## 2022-12-25 NOTE — Progress Notes (Signed)
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  HISTORY OF PRESENTING ILLNESS:   Cody Hampton is a wonderful 62 y.o. 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  Today, he reports that he has endorsed an elevated high white blood count for 10-20 years. He reports that he has not had any myeloma panel testing since 2021. He denies any symptoms that triggered his referral to hematology. He reports that it has been 22 years since his gastric bypass surgery.   Patient is vitamin B12 deficient due to gastric bypass surgery. He regularly takes vitamin B and iron supplements. Patient regularly takes iron supplements but does not take any separate iron. Patient has been steadily losing weight for at least  the past 1.5 years.   He reports that he has not been able to receive B12 injections because his wife, who used to administer it, has been sick with uterine cancer. He has not received B12 injections for 1.5 years.   In regards to family history, he reports that his father had bladder cancer and was not a smoker. Patient denies any history of smoking. He also reports that two of his sisters have cancer, but is unsure of the type. He notes that one of his sisters endorses a lymph node cancer.   He complains of frequent fatigue and uses CPAP daily. He also complains of knee pain and occasional bone pain. He denies any change in bowel habits, abdominal pain, or new leg swelling.  He reports that his blood sugar levels have been stable and he continues to be on Celexa for anxiety/depression symptoms.  INTERVAL HISTORY:  Cody Hampton is a wonderful 62 y.o.  male who is in clinic for continued evaluation and management of IgG kappa monoclonal gammopathy/MGUS with leukocytosis. He was last seen by me on 11/08/2022 and complained of weight loss, vitamin B12 deficiency, frequent fatigue, knee pain, and occasional bone pain.   I connected with Cody Hampton on 12/25/22 at  3:30 PM EDT by telephone visit and verified that I am speaking with the correct person using two identifiers.   I discussed the limitations, risks, security and privacy concerns of performing an evaluation and management service by telemedicine and the availability of in-person appointments. I also discussed with the patient that there may be a patient responsible charge related to this service. The patient expressed understanding and agreed to proceed.   Other persons participating in the visit and their role in the encounter: none   Patient's location: Home  Provider's location: [facility]   Chief Complaint: continued evaluation and management of IgG kappa monoclonal gammopathy/MGUS with leukocytosis  Today, patient reports he is doing well overall. He denies any new symptoms, new infections, cough, colds, or urinary abnormalities.   Patient recently saw Dr. Vivi Ferns for his arthritis and has a scan scheduled.    MEDICAL HISTORY:  Past Medical History:  Diagnosis Date   Arthritis    in knees   Depression    Hypertension    Obesity    Severe sleep apnea   IDA B12 deficicency  SURGICAL HISTORY: Gastric bypass surgery   SOCIAL HISTORY: Social History   Socioeconomic History   Marital status: Married    Spouse name: Not  on file   Number of children: Not on file   Years of education: Not on file   Highest education level: Not on file  Occupational History   Not on file  Tobacco Use   Smoking status: Not on file   Smokeless tobacco: Not on file  Substance and Sexual Activity   Alcohol use: Not on file   Drug use: Not on file   Sexual activity: Not on file  Other  Topics Concern   Not on file  Social History Narrative   Not on file   Social Determinants of Health   Financial Resource Strain: Not on file  Food Insecurity: Not on file  Transportation Needs: Not on file  Physical Activity: Not on file  Stress: Not on file  Social Connections: Not on file  Intimate Partner Violence: Not on file    FAMILY HISTORY: Reports that his father had bladder cancer and was not a smoker. Patient denies any history of smoking. He also reports that two of his sisters have cancer, but is unsure of the type. He notes that one of his sisters endorses a lymph node cancer.   ALLERGIES:  has No Known Allergies.  MEDICATIONS:  Current Outpatient Medications  Medication Sig Dispense Refill   Calcium Carbonate-Vitamin D (CALCIUM-VITAMIN D) 500-200 MG-UNIT per tablet Take 1 tablet by mouth daily.     carbonyl iron (FEOSOL) 45 MG TABS tablet Take 45 mg by mouth. As directed     Cyanocobalamin (VITAMIN B-12 IJ) Inject as directed. ytake 1 injection weekly     diclofenac (VOLTAREN) 75 MG EC tablet Take 75 mg by mouth 2 (two) times daily.     diphenoxylate-atropine (LOMOTIL) 2.5-0.025 MG/5ML liquid Take by mouth 4 (four) times daily as needed for diarrhea or loose stools.     lisinopril-hydrochlorothiazide (PRINZIDE,ZESTORETIC) 20-12.5 MG per tablet Take 1 tablet by mouth daily.     Multiple Vitamin (MULTIVITAMIN) tablet Take 1 tablet by mouth daily.     NON FORMULARY fortesta Gel pumps once a day     ondansetron (ZOFRAN) 8 MG tablet Take by mouth every 8 (eight) hours as needed for nausea or vomiting.     polycarbophil (FIBERCON) 625 MG tablet Take 625 mg by mouth daily.     potassium chloride (K-DUR) 10 MEQ tablet Take 10 mEq by mouth daily.     traMADol-acetaminophen (ULTRACET) 37.5-325 MG per tablet Take 1 tablet by mouth every 6 (six) hours as needed. 1-2 tabs prn     No current facility-administered medications for this visit.    REVIEW OF SYSTEMS:    10  Point review of Systems was done is negative except as noted above.  PHYSICAL EXAMINATION: TELE-MED VISIT  LABORATORY DATA:  I have reviewed the data as listed  .    Latest Ref Rng & Units 11/08/2022    3:08 PM  CBC  WBC 4.0 - 10.5 K/uL 14.5   Hemoglobin 13.0 - 17.0 g/dL 16.1   Hematocrit 09.6 - 52.0 % 41.8   Platelets 150 - 400 K/uL 409     .    Latest Ref Rng & Units 11/08/2022    3:08 PM  CMP  Glucose 70 - 99 mg/dL 045   BUN 8 - 23 mg/dL 16   Creatinine 4.09 - 1.24 mg/dL 8.11   Sodium 914 - 782 mmol/L 139   Potassium 3.5 - 5.1 mmol/L 4.3   Chloride 98 - 111 mmol/L 105   CO2 22 - 32  mmol/L 24   Calcium 8.9 - 10.3 mg/dL 8.9   Total Protein 6.5 - 8.1 g/dL 7.1   Total Bilirubin 0.3 - 1.2 mg/dL 0.3   Alkaline Phos 38 - 126 U/L 91   AST 15 - 41 U/L 14   ALT 0 - 44 U/L 14     CMP 05/26/2022:   PSA Screening 05/26/2022:     CBC and Differential 05/26/2022:       RADIOGRAPHIC STUDIES: I have personally reviewed the radiological images as listed and agreed with the findings in the report. DG Bone Survey Met  Result Date: 12/10/2022 CLINICAL DATA:  Staging monoclonal paraproteinemia EXAM: METASTATIC BONE SURVEY COMPARISON:  CT 12/12/2019 and previous FINDINGS: No lytic lucencies or other worrisome bone lesion. Multiple missing teeth. Mild anterior endplate spurring at multiple levels in the cervical spine with narrowing of interspaces C4-C7. Vertebral endplate spurring at multiple levels in the mid and lower thoracic spine. Surgical clips in the upper abdomen. Exuberant anterior endplate spurring W09-W1. Mild disc narrowing L4-5 and L5-S1. 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 Left knee DJD most marked lateral compartment. Scattered soft tissue calcifications in bilateral calfs. IMPRESSION: 1. No lytic lucencies or other worrisome bone lesion. 2. Degenerative changes as above. Electronically Signed   By: Corlis Leak M.D.   On: 12/10/2022 19:43    ASSESSMENT &  PLAN:   Wonderful 62 y.o. male with:  1. MGUS (monoclonal gammopathy of unknown significance) 2. Leukocytosis, unspecified typeEssential hypertension 3. Anxiety and depression 4. Morbid obesity 5. OSA on CPAP 6.Iron deficiency and B12 deficiency related to Gastric bypass surgery  PLAN:  -Discussed labs from 11/08/2022 in detail with patient. CBC normal -CMP stable, no signs of CKD and normal calcium levels -Myeloma panel had an M protein level of 0.3  -LDH normal -Iron labs had normal ferritin of 184, iron saturation of 13% -Informed patient that ferritin levels may not always be accurate due to chronic inflammation and iron saturation levels should have 20% -Discussed the option of taking iron supplements, either Farasulfate or Polysaccharide  -Vitamin B-12 levels normal -No anemia -Whole body X-ray showed no concerning bone lesions or abnormalities related to MM -No signs of MGUS progression to MM -Advised patient to see PCP in 4-6 months -Will follow-up with patient in 12 months -Will continue to monitor MGUS . No orders of the defined types were placed in this encounter.   FOLLOW-UP: RTC with Dr Candise Che in 11 months Labs 2 weeks prior to clinic visit  The total time spent in the appointment was 20 minutes* .  All of the patient's questions were answered with apparent satisfaction. The patient knows to call the clinic with any problems, questions or concerns.   Wyvonnia Lora MD MS AAHIVMS [facility] Hematology/Oncology Physician [facility]  .*Total Encounter Time as defined by the Centers for Medicare and Medicaid Services includes, in addition to the face-to-face time of a patient visit (documented in the note above) non-face-to-face time: obtaining and reviewing outside history, ordering and reviewing medications, tests or procedures, care coordination (communications with other health care professionals or caregivers) and documentation in the medical  record.   Alben Deeds Teague,acting as a Neurosurgeon for Wyvonnia Lora, MD.,have documented all relevant documentation on the behalf of Wyvonnia Lora, MD,as directed by  Wyvonnia Lora, MD while in the presence of Wyvonnia Lora, MD.  .I have reviewed the above documentation for accuracy and completeness, and I agree with the above. Johney Maine  MD   

## 2022-12-27 NOTE — Progress Notes (Signed)
This encounter was created in error - please disregard.

## 2023-02-28 DIAGNOSIS — H6123 Impacted cerumen, bilateral: Secondary | ICD-10-CM | POA: Insufficient documentation

## 2023-04-18 DIAGNOSIS — H9313 Tinnitus, bilateral: Secondary | ICD-10-CM | POA: Insufficient documentation

## 2023-04-18 DIAGNOSIS — H903 Sensorineural hearing loss, bilateral: Secondary | ICD-10-CM | POA: Insufficient documentation

## 2023-05-08 ENCOUNTER — Ambulatory Visit: Payer: Medicare HMO | Admitting: Podiatry

## 2023-05-08 ENCOUNTER — Encounter: Payer: Self-pay | Admitting: Podiatry

## 2023-05-08 DIAGNOSIS — R7303 Prediabetes: Secondary | ICD-10-CM

## 2023-05-08 DIAGNOSIS — B351 Tinea unguium: Secondary | ICD-10-CM | POA: Diagnosis not present

## 2023-05-08 DIAGNOSIS — M79674 Pain in right toe(s): Secondary | ICD-10-CM

## 2023-05-08 DIAGNOSIS — M79675 Pain in left toe(s): Secondary | ICD-10-CM

## 2023-05-08 NOTE — Progress Notes (Signed)
  Subjective:  Patient ID: Cody Hampton, male    DOB: 1960/09/14,   MRN: [id number]  Chief Complaint  Patient presents with   RFC    RM#23 RFC nail trim    62 y.o. male presents for concern of thickened elongated and painful nails that are difficult to trim. Requesting to have them trimmed today. Relates burning and tingling in their feet. Patient is pre-diabetic  PCP:  Default, Provider, MD     . Denies any other pedal complaints. Denies n/v/f/c.   Past Medical History:  Diagnosis Date   Arthritis    in knees   Depression    Hypertension    Obesity    Severe sleep apnea     Objective:  Physical Exam: Vascular: DP/PT pulses 2/4 bilateral. CFT <3 seconds. Normal hair growth on digits. No edema.  Skin. No lacerations or abrasions bilateral feet. Nails 1-5 bilateral thickened elongated and discolored with subungual debris.  Musculoskeletal: MMT 5/5 bilateral lower extremities in DF, PF, Inversion and Eversion. Deceased ROM in DF of ankle joint.  Neurological: Sensation intact to light touch.   Assessment:   1. Pain due to onychomycosis of toenails of both feet      Plan:  Patient was evaluated and treated and all questions answered. -Discussed and educated patient on diabetic foot care, especially with  regards to the vascular, neurological and musculoskeletal systems.  -Stressed the importance of good glycemic control and the detriment of not  controlling glucose levels in relation to the foot. -Discussed supportive shoes at all times and checking feet regularly.  -Mechanically debrided all nails 1-5 bilateral using sterile nail nipper and filed with dremel without incident as courtesy today.  -Answered all patient questions -Patient to return as needed for rfc.  -Patient advised to call the office if any problems or questions arise in the meantime.   Louann Sjogren, DPM

## 2023-06-26 ENCOUNTER — Emergency Department (HOSPITAL_COMMUNITY): Payer: Medicare HMO

## 2023-06-26 ENCOUNTER — Encounter (HOSPITAL_COMMUNITY): Payer: Self-pay | Admitting: *Deleted

## 2023-06-26 ENCOUNTER — Other Ambulatory Visit: Payer: Self-pay

## 2023-06-26 ENCOUNTER — Emergency Department (HOSPITAL_COMMUNITY)
Admission: EM | Admit: 2023-06-26 | Discharge: 2023-06-26 | Disposition: A | Discharge status: Home / Self Care | Payer: Medicare HMO | Attending: Emergency Medicine | Admitting: Emergency Medicine

## 2023-06-26 DIAGNOSIS — R0609 Other forms of dyspnea: Secondary | ICD-10-CM | POA: Insufficient documentation

## 2023-06-26 DIAGNOSIS — I10 Essential (primary) hypertension: Secondary | ICD-10-CM | POA: Insufficient documentation

## 2023-06-26 DIAGNOSIS — Z20822 Contact with and (suspected) exposure to covid-19: Secondary | ICD-10-CM | POA: Insufficient documentation

## 2023-06-26 DIAGNOSIS — Z79899 Other long term (current) drug therapy: Secondary | ICD-10-CM | POA: Diagnosis not present

## 2023-06-26 DIAGNOSIS — I4891 Unspecified atrial fibrillation: Secondary | ICD-10-CM | POA: Insufficient documentation

## 2023-06-26 DIAGNOSIS — R000 Tachycardia, unspecified: Secondary | ICD-10-CM | POA: Insufficient documentation

## 2023-06-26 DIAGNOSIS — Z1152 Encounter for screening for COVID-19: Secondary | ICD-10-CM | POA: Insufficient documentation

## 2023-06-26 DIAGNOSIS — R42 Dizziness and giddiness: Secondary | ICD-10-CM | POA: Diagnosis present

## 2023-06-26 DIAGNOSIS — R7303 Prediabetes: Secondary | ICD-10-CM | POA: Insufficient documentation

## 2023-06-26 DIAGNOSIS — M1990 Unspecified osteoarthritis, unspecified site: Secondary | ICD-10-CM | POA: Insufficient documentation

## 2023-06-26 LAB — COMPREHENSIVE METABOLIC PANEL
ALT: 18 U/L (ref 0–44)
AST: 24 U/L (ref 15–41)
Albumin: 3.8 g/dL (ref 3.5–5.0)
Alkaline Phosphatase: 90 U/L (ref 38–126)
Anion gap: 14 (ref 5–15)
BUN: 16 mg/dL (ref 8–23)
CO2: 20 mmol/L — ABNORMAL LOW (ref 22–32)
Calcium: 9.7 mg/dL (ref 8.9–10.3)
Chloride: 106 mmol/L (ref 98–111)
Creatinine, Ser: 1.14 mg/dL (ref 0.61–1.24)
GFR, Estimated: >60 mL/min (ref >60)
Glucose, Bld: 203 mg/dL — ABNORMAL HIGH (ref 70–99)
Potassium: 4.1 mmol/L (ref 3.5–5.1)
Sodium: 140 mmol/L (ref 135–145)
Total Bilirubin: 0.6 mg/dL (ref <1.2)
Total Protein: 7.9 g/dL (ref 6.5–8.1)

## 2023-06-26 LAB — TSH: TSH: 2.325 u[IU]/mL (ref 0.350–4.500)

## 2023-06-26 LAB — RESP PANEL BY RT-PCR (RSV, FLU A&B, COVID)  RVPGX2
Influenza A by PCR: NEGATIVE
Influenza B by PCR: NEGATIVE
Resp Syncytial Virus by PCR: NEGATIVE
SARS Coronavirus 2 by RT PCR: NEGATIVE

## 2023-06-26 LAB — CBC
HCT: 49.7 % (ref 39.0–52.0)
Hemoglobin: 16.4 g/dL (ref 13.0–17.0)
MCH: 29.6 pg (ref 26.0–34.0)
MCHC: 33 g/dL (ref 30.0–36.0)
MCV: 89.7 fL (ref 80.0–100.0)
Platelets: 479 10*3/uL — ABNORMAL HIGH (ref 150–400)
RBC: 5.54 MIL/uL (ref 4.22–5.81)
RDW: 13.8 % (ref 11.5–15.5)
WBC: 13.6 10*3/uL — ABNORMAL HIGH (ref 4.0–10.5)
nRBC: 0 % (ref 0.0–0.2)

## 2023-06-26 LAB — D-DIMER, QUANTITATIVE: D-Dimer, Quant: 0.58 ug{FEU}/mL — ABNORMAL HIGH (ref 0.00–0.50)

## 2023-06-26 LAB — MAGNESIUM: Magnesium: 2.1 mg/dL (ref 1.7–2.4)

## 2023-06-26 MED ORDER — APIXABAN 5 MG PO TABS: 5.0000 mg | ORAL_TABLET | Freq: Two times a day (BID) | ORAL | 2 refills | Status: DC

## 2023-06-26 MED ORDER — METOPROLOL TARTRATE 25 MG PO TABS: 12.5000 mg | ORAL_TABLET | Freq: Two times a day (BID) | ORAL | 2 refills | Status: DC

## 2023-06-26 MED ORDER — DILTIAZEM HCL 25 MG/5ML IV SOLN
20.0000 mg | Freq: Once | INTRAVENOUS | Status: AC
  Administered 2023-06-26: 20 mg via INTRAVENOUS
  Filled 2023-06-26: qty 5

## 2023-06-26 MED ORDER — SODIUM CHLORIDE 0.9 % IV BOLUS
500.0000 mL | Freq: Once | INTRAVENOUS | Status: AC
  Administered 2023-06-26: 500 mL via INTRAVENOUS

## 2023-06-26 MED ORDER — APIXABAN 5 MG PO TABS
5.0000 mg | ORAL_TABLET | Freq: Once | ORAL | Status: AC
  Administered 2023-06-26: 5 mg via ORAL
  Filled 2023-06-26: qty 1

## 2023-06-26 MED ORDER — FENTANYL CITRATE PF 50 MCG/ML IJ SOSY
PREFILLED_SYRINGE | INTRAMUSCULAR | Status: AC
  Filled 2023-06-26: qty 2

## 2023-06-26 MED ORDER — DILTIAZEM HCL 25 MG/5ML IV SOLN
INTRAVENOUS | Status: AC
  Filled 2023-06-26: qty 5

## 2023-06-26 NOTE — ED Triage Notes (Signed)
[facility]N and tachy, started ? After dental procedure brought son to [facility] for treatment with he made them aware he was diaphoretic and not feeling well, dizzy upon standing.163/132 PTA tachy 150 irr

## 2023-06-26 NOTE — ED Provider Notes (Signed)
[place] EMERGENCY DEPARTMENT AT [facility] Provider Note   CSN: [id number] Arrival date & time: 06/26/23  1548     History  Chief Complaint  Patient presents with   Hypertension   Tachycardia    Cody Hampton is a 62 y.o. male with PMH as listed below who presents with dizziness, dyspnea on exertion, tachycardia.  Patient had a dental procedure performed yesterday, and during the dental procedure began to feel lightheaded/dizzy. He had previously felt in his normal state of health. Has gotten progressively worse since then. Patient was accompanying his son to the cancer center where his son is receiving chemotherapy, and patient was diaphoretic, dizzy, and dyspneic after walking ~10 feet. Had vitals signs taken and was noted to be tachycardic into 150s so was sent to the ED. Denies flu-like symptoms, urinary sxs, N/V, abd pain, CP, leg swelling. No new medications. No h/o Afib/flutter. Does have remote history many years ago of having a heart rate in the 190s for which he received IV medication in the emergency department but does not know what the rhythm was and has not had that happen since that time. Does not take blood thinners. He has no pain after his dental procedure but has not eaten very well d/t having it done yesterday.  Past Medical History:  Diagnosis Date   Arthritis    in knees   Depression    Hypertension    Obesity    Severe sleep apnea        Home Medications Prior to Admission medications   Medication Sig Start Date End Date Taking? Authorizing Provider  Calcium Carbonate-Vitamin D (CALCIUM-VITAMIN D) 500-200 MG-UNIT per tablet Take 1 tablet by mouth daily.    [provider]  Cyanocobalamin (VITAMIN B-12 IJ) Inject as directed. ytake 1 injection weekly    [provider]  diclofenac (VOLTAREN) 75 MG EC tablet Take 75 mg by mouth 2 (two) times daily.    [provider]  diphenoxylate-atropine (LOMOTIL) 2.5-0.025 MG/5ML  liquid Take by mouth 4 (four) times daily as needed for diarrhea or loose stools.    [provider]  iron polysaccharides (NIFEREX) 150 MG capsule Take 1 capsule (150 mg total) by mouth daily. 12/25/22   Johney Maine, MD  lisinopril-hydrochlorothiazide (PRINZIDE,ZESTORETIC) 20-12.5 MG per tablet Take 1 tablet by mouth daily.    [provider]  Multiple Vitamin (MULTIVITAMIN) tablet Take 1 tablet by mouth daily.    [provider]  NON FORMULARY fortesta Gel pumps once a day    [provider]  ondansetron (ZOFRAN) 8 MG tablet Take by mouth every 8 (eight) hours as needed for nausea or vomiting.    [provider]  polycarbophil (FIBERCON) 625 MG tablet Take 625 mg by mouth daily.    [provider]  potassium chloride (K-DUR) 10 MEQ tablet Take 10 mEq by mouth daily.    [provider]  traMADol-acetaminophen (ULTRACET) 37.5-325 MG per tablet Take 1 tablet by mouth every 6 (six) hours as needed. 1-2 tabs prn    [provider]      Allergies    Patient has no known allergies.    Review of Systems   Review of Systems A 10 point review of systems was performed and is negative unless otherwise reported in HPI.  Physical Exam Updated Vital Signs Ht 5\' 10"  (1.778 m)   Wt (!) 188.2 kg   BMI 59.55 kg/m  Physical Exam General: Uncomfortable, fatigued-appearing obese  male, lying in bed.  HEENT: Sclera anicteric, MMM, trachea midline.  Cardiology: Irregularly irregular and tachycardic rate, no murmurs/rubs/gallops. Resp: Normal respiratory rate and effort. CTAB, no wheezes, rhonchi, crackles.  Abd: Soft, non-tender, non-distended. No rebound tenderness or guarding.  GU: Deferred. MSK: No peripheral edema or signs of trauma. Extremities without deformity or TTP.  Skin: pale, warm, diaphoretic.  Neuro: A&Ox4, CNs II-XII grossly intact. MAEs. Sensation grossly intact.  Psych: Normal mood and affect.   ED Results /  Procedures / Treatments   Labs (all labs ordered are listed, but only abnormal results are displayed) Labs Reviewed  CBC - Abnormal; Notable for the following components:      Result Value   WBC 13.6 (*)    Platelets 479 (*)    All other components within normal limits  COMPREHENSIVE METABOLIC PANEL - Abnormal; Notable for the following components:   CO2 20 (*)    Glucose, Bld 203 (*)    All other components within normal limits  D-DIMER, QUANTITATIVE - Abnormal; Notable for the following components:   D-Dimer, Quant 0.58 (*)    All other components within normal limits  T4, FREE - Abnormal; Notable for the following components:   Free T4 1.19 (*)    All other components within normal limits  RESP PANEL BY RT-PCR (RSV, FLU A&B, COVID)  [id number]  MAGNESIUM  TSH    EKG EKG Interpretation Date/Time:  Tuesday June 26 2023 15:54:11 EST Ventricular Rate:  142 PR Interval:    QRS Duration:  102 QT Interval:  336 QTC Calculation: 517 R Axis:   -72  Text Interpretation: Atrial flutter with predominant 2:1 AV block Left anterior fascicular block Anterior infarct, old Prolonged QT interval No prior for comparison Confirmed by Vivi Barrack [phone]) on 06/26/2023 4:07:44 PM  Radiology No results found.  Procedures .Critical Care  Performed by: Loetta Rough, MD Authorized by: Loetta Rough, MD   Critical care provider statement:    Critical care time (minutes):  35   Critical care was necessary to treat or prevent imminent or life-threatening deterioration of the following conditions:  Circulatory failure, cardiac failure and shock   Critical care was time spent personally by me on the following activities:  Development of treatment plan with patient or surrogate, evaluation of patient's response to treatment, examination of patient, ordering and review of laboratory studies, ordering and review of radiographic studies, ordering and performing treatments and interventions,  pulse oximetry, re-evaluation of patient's condition, review of old charts and obtaining history from patient or surrogate .Cardioversion  Date/Time: 06/29/2023 2:18 AM  Performed by: Loetta Rough, MD Authorized by: Loetta Rough, MD   Consent:    Consent obtained:  Verbal   Consent given by:  Patient and spouse   Risks discussed:  Cutaneous burn, death, induced arrhythmia and pain   Alternatives discussed:  No treatment, rate-control medication, observation and delayed treatment Universal protocol:    Procedure explained and questions answered to patient or proxy's satisfaction: yes     Immediately prior to procedure a time out was called: yes     Patient identity confirmed:  Verbally with patient Pre-procedure details:    Cardioversion basis:  Emergent   Rhythm:  Atrial fibrillation   Electrode placement:  Anterior-posterior Patient sedated: No Attempt one:    Cardioversion mode:  Synchronous   Waveform:  Biphasic   Shock (Joules):  120   Shock outcome:  Conversion to normal sinus rhythm Post-procedure details:  Patient status:  Awake   Patient tolerance of procedure:  Tolerated well, no immediate complications     Medications Ordered in ED Medications  sodium chloride 0.9 % bolus 500 mL (0 mLs Intravenous Stopped 06/26/23 1903)  diltiazem (CARDIZEM) injection 20 mg (20 mg Intravenous Given 06/26/23 1727)  fentaNYL (SUBLIMAZE) 50 MCG/ML injection (  Given 06/26/23 1754)  apixaban (ELIQUIS) tablet 5 mg (5 mg Oral Given 06/26/23 2229)    ED Course/ Medical Decision Making/ A&P                          Medical Decision Making Amount and/or Complexity of Data Reviewed Labs: ordered. Decision-making details documented in ED Course. Radiology: ordered. Decision-making details documented in ED Course.  Risk Prescription drug management.    This patient presents to the ED for concern of dizziness, DOE, tachycardia; this involves an extensive number of treatment  options, and is a complaint that carries with it a high risk of complications and morbidity.  I considered the following differential and admission for this acute, potentially life threatening condition.   MDM:    Patient denies any CP to indicate ACS, but does endorse DOE. His EKG shows atrial flutter 2:1 and tleemetry shows intermittent Aflutter 2:1 with atrial fibrillation with RVR. Patient has no h/o this and takes no blood thinners. He endorses this began <24 hours ago, previously was feeling normal. He states this occurred while at the dentist, so must of course consider LAST as a possible etiology of his tachycardia/arrhythmia, however he does not have hypertension (SBP 113) or ventricular dysrhythmia, and no other signs of LAST including agitation/tinnitus/somnolence. Consider electrolyte derangements, dehydration, thyroid abnormality, or new PE. Consider occult infection such as PNA, viral URI, UTI.   Discussed with patient. Cardioversion is an option d/t sxs within 48 hours. However patient is poor sedation candidate d/t body habitus, and patient would prefer to do IV meds. Will give 500 cc bolus of NS and diltiazem 20 mg IV.    Clinical Course as of 06/29/23 0217  Tue Jun 26, 2023  1748 D-Dimer, Quant(!): 0.58 Neg age-adjusted D-dimer [HN]  1748 Patient  [HN]  [phone] Patient is given one dose of diltiazem 20 mg and his rate is now 70s-90s bpm. He discussed with his wife over the phone who states that she wanted him to receive a cardioversion.  Patient is now stating that he wants a cardioversion.  Discussed with the patient the risks of the cardioversion and he and his wife are consented. Patient subsequently becomes hypotensive in 70s/30s. He will be given 100 mcg of fentanyl and shocked emergently.  [HN]  1800 500 cc normal saline bolus was going but blood pressure continued to downtrend into the low was 70 systolic with atrial fibrillation.  Diltiazem could be contributing, however the  diltiazem bolus occurred greater than an hour ago.  Gave 100 mcg of fentanyl and patient was cardioverted utilizing synchronized 120 J with immediate resolution of atrial fibrillation and return to normal sinus rhythm.  Patient reports that he feels improved and feels less lightheaded.  Blood pressure 100/67 systolic after cardioversion. [HN]  1847 Resp panel by RT-PCR (RSV, Flu A&B, Covid) Anterior Nasal Swab neg [HN]  1847 DG Chest Portable 1 View No active disease [HN]  1940 TSH: 2.325 wnl [HN]  2126 Patient ambulated and tolerated it well.  He has maintained normal sinus rhythm.  Repeatedly unable to urinate.  Will get bladder scan. [  HN]  2212 CHADSVASC is 2, will start eliquis. Will prescribe and give dose prior to discharge.  [HN]  2221 Discussed with patient, patient's son, and patient's wife over the phone.  Patient thinks he is likely dehydrated and has not had any symptoms of UTI, no fevers or chills recently.  Does not feel like he has UTI and does not want to wait to give a urine sample.  He was ambulated on the department and feels well, no dyspnea or lightheadedness.  Patient did unfortunately convert back into A-fib, but his rate is controlled now, in the 80s beats per minute.  I discussed with patient about starting Eliquis as well as rate control.  Will start Eliquis 5 mg twice daily and metoprolol tartrate 12.5 mg twice daily and refer to A-fib clinic with cardiology.  Patient is given specific discharge instructions and return precautions.  Instructed to stay well-hydrated.  Will follow-up with cardiology within 1 week.  All questions answered to patient and his wife satisfaction over the phone. [HN]    Clinical Course User Index [HN] Loetta Rough, MD    Labs: I Ordered, and personally interpreted labs.  The pertinent results include:  those listed above  Imaging Studies ordered: I ordered imaging studies including CXR I independently visualized and interpreted imaging. I  agree with the radiologist interpretation  Additional history obtained from chart review, son at bedside.    Cardiac Monitoring: The patient was maintained on a cardiac monitor.  I personally viewed and interpreted the cardiac monitored which showed an underlying rhythm of: atrial fibrillation  Reevaluation: After the interventions noted above, I reevaluated the patient and found that they have :improved  Social Determinants of Health: Lives independently  Disposition:  DC w/ discharge instructions/return precautions. All questions answered to patient's satisfaction.    Co morbidities that complicate the patient evaluation  Past Medical History:  Diagnosis Date   Arthritis    in knees   Depression    Hypertension    Obesity    Severe sleep apnea      Medicines Meds ordered this encounter  Medications   sodium chloride 0.9 % bolus 500 mL   diltiazem (CARDIZEM) injection 20 mg    I have reviewed the patients home medicines and have made adjustments as needed  Problem List / ED Course: Problem List Items Addressed This Visit   None Visit Diagnoses       Atrial fibrillation with RVR (HCC)    -  Primary   Relevant Medications   diltiazem (CARDIZEM) injection 20 mg (Completed)   lisinopril (ZESTRIL) 40 MG tablet   apixaban (ELIQUIS) tablet 5 mg (Completed)   apixaban (ELIQUIS) 5 MG TABS tablet   metoprolol tartrate (LOPRESSOR) 25 MG tablet   Other Relevant Orders   Ambulatory referral to Cardiology                   This note was created using dictation software, which may contain spelling or grammatical errors.    Loetta Rough, MD 06/29/23 [phone]

## 2023-06-26 NOTE — ED Notes (Signed)
Pt able to tolerate walking with his home wheelchair.

## 2023-06-26 NOTE — ED Notes (Addendum)
Pt medicated with 100 mcg Fentanyl then cardioverted at 120 joules. Dr Jearld Fenton present, Pt noted to return for NSR.  See Validation for VS. EKG obtained. Pt placed on 2 L [employer]. Pt diaphoretic and pale

## 2023-06-26 NOTE — Discharge Instructions (Signed)
Thank you for coming to [facility]. You were seen for lightheadedness, shortness of breath, and high heart rate. We did an exam, labs, and imaging, and these showed elevated heart rate consistent with atrial fibrillation. You were treated with cardioversion in the emergency department. You were started on blood thinner Eliquis to take 5 mg twice per day as well as metoprolol tartrate to take 12.5 mg (1/2 tablet) twice per day. Please follow up with the cardiologist in Afib clinic. They will call you to make an appointment.   Do not hesitate to return to the ED or call 911 if you experience: -Worsening symptoms -Chest pain -Shortness of breath -Lightheadedness, passing out -Fevers/chills -Anything else that concerns you

## 2023-06-26 NOTE — ED Notes (Signed)
Pt is unable to void at this time.  

## 2023-06-27 LAB — T4, FREE: Free T4: 1.19 ng/dL — ABNORMAL HIGH (ref 0.61–1.12)

## 2023-07-05 ENCOUNTER — Ambulatory Visit (HOSPITAL_COMMUNITY)
Admission: RE | Admit: 2023-07-05 | Discharge: 2023-07-05 | Disposition: A | Discharge status: Home / Self Care | Payer: Medicare HMO | Source: Ambulatory Visit | Attending: Internal Medicine | Admitting: Internal Medicine

## 2023-07-05 ENCOUNTER — Encounter (HOSPITAL_COMMUNITY): Payer: Self-pay | Admitting: Internal Medicine

## 2023-07-05 ENCOUNTER — Inpatient Hospital Stay (HOSPITAL_COMMUNITY)
Admission: RE | Admit: 2023-07-05 | Discharge: 2023-07-05 | Disposition: A | Discharge status: Home / Self Care | Payer: Medicare HMO | Source: Ambulatory Visit | Attending: Internal Medicine | Admitting: Internal Medicine

## 2023-07-05 VITALS — BP 132/78 | HR 83 | Ht 70.0 in | Wt >= 6400 oz

## 2023-07-05 DIAGNOSIS — I4891 Unspecified atrial fibrillation: Secondary | ICD-10-CM | POA: Diagnosis present

## 2023-07-05 DIAGNOSIS — Z7901 Long term (current) use of anticoagulants: Secondary | ICD-10-CM | POA: Diagnosis not present

## 2023-07-05 DIAGNOSIS — I10 Essential (primary) hypertension: Secondary | ICD-10-CM | POA: Insufficient documentation

## 2023-07-05 DIAGNOSIS — Z79899 Other long term (current) drug therapy: Secondary | ICD-10-CM | POA: Insufficient documentation

## 2023-07-05 DIAGNOSIS — I4892 Unspecified atrial flutter: Secondary | ICD-10-CM | POA: Diagnosis not present

## 2023-07-05 NOTE — Progress Notes (Addendum)
Primary Care Physician: Default, Provider, MD Primary Cardiologist: None Electrophysiologist: None     Referring Physician: ED     Cody Hampton is a 62 y.o. male with a history of HTN, obesity, OSA, and paroxysmal atrial fibrillation who presents for consultation in the [facility]. ED visit on 06/26/23 for new onset atrial flutter with RVR s/p successful DCCV but was noted to return into Afib HR 80. Patient notes it occurred while at dentist, concerning for local anesthetic systemic toxicity (LAST). He did not appear to have other symptoms of LAST. Discharged on Lopressor 12.5 mg BID. Patient is on Eliquis 5 mg BID for a CHADS2VASC score of 1.  On evaluation today, he is currently in NSR. Patient did not know he had returned into Afib after cardioversion. He has history of OSA. He does not drink alcohol. He does admit to stressful environment when taking son for his cancer treatment. He drinks 1 cup of coffee daily and then 54 oz of half no caffeine / half caffeinated soda. He does not know if he has had any other episodes of Afib since ED visit. He has been compliant with Lopressor 12.5 mg BID and Eliquis 5 mg BID.   Today, he denies symptoms of palpitations, chest pain, shortness of breath, orthopnea, PND, lower extremity edema, dizziness, presyncope, syncope, snoring, daytime somnolence, bleeding, or neurologic sequela. The patient is tolerating medications without difficulties and is otherwise without complaint today.    Atrial Fibrillation Risk Factors:  he does have symptoms or diagnosis of sleep apnea.  he has a BMI of Body mass index is 58.91 kg/m.Marland Kitchen Filed Weights   07/05/23 1437  Weight: (!) 186.2 kg    Current Outpatient Medications  Medication Sig Dispense Refill   amoxicillin (AMOXIL) 500 MG capsule Take 500 mg by mouth 3 (three) times daily.     apixaban (ELIQUIS) 5 MG TABS tablet Take 1 tablet (5 mg total) by mouth 2 (two) times daily. 60  tablet 2   citalopram (CELEXA) 20 MG tablet Take 1 tablet by mouth daily.     Cyanocobalamin (VITAMIN B12 SL) Place 1 tablet under the tongue in the morning.     lisinopril (ZESTRIL) 40 MG tablet Take 40 mg by mouth daily.     loperamide (IMODIUM A-D) 2 MG tablet Take 2-4 mg by mouth See admin instructions. Take 4 mg by mouth in the morning and an additional 2 mg twice a day as needed for diarrhea or loose stools     metoprolol tartrate (LOPRESSOR) 25 MG tablet Take 0.5 tablets (12.5 mg total) by mouth 2 (two) times daily. 30 tablet 2   Multiple Vitamins-Minerals (BARIATRIC MULTIVITAMINS/IRON) CAPS Take 1 capsule by mouth daily with breakfast.     oxybutynin (DITROPAN XL) 15 MG 24 hr tablet Take 15 mg by mouth daily.     oxyCODONE-acetaminophen (PERCOCET/ROXICET) 5-325 MG tablet Take 1 tablet by mouth See admin instructions. Take 1 tablet by mouth in the morning and an additional 1 tablet once a day as needed for pain     pantoprazole (PROTONIX) 40 MG tablet Take 40 mg by mouth daily as needed (for reflux or GERD symptoms).     polycarbophil (FIBERCON) 625 MG tablet Take 625 mg by mouth daily.     No current facility-administered medications for this encounter.    Atrial Fibrillation Management history:  Previous antiarrhythmic drugs: none Previous cardioversions: 06/26/23 (ED) Previous ablations: none Anticoagulation history: Eliquis   ROS- All  systems are reviewed and negative except as per the HPI above.  Physical Exam: BP 132/78   Pulse 83   Ht 5\' 10"  (1.778 m)   Wt (!) 186.2 kg   BMI 58.91 kg/m   GEN: Well nourished, well developed in no acute distress. He is diaphoretic; obese habitus.  NECK: No JVD; No carotid bruits CARDIAC: Regular rate and rhythm, no murmurs, rubs, gallops RESPIRATORY:  Clear to auscultation without rales, wheezing or rhonchi  ABDOMEN: Soft, non-tender, non-distended EXTREMITIES:  No edema; No deformity   EKG today demonstrates  Vent. rate 83  BPM PR interval 152 ms QRS duration 92 ms QT/QTcB 362/425 ms P-R-T axes 3 -33 19 Normal sinus rhythm Left axis deviation Abnormal ECG When compared with ECG of 26-Jun-2023 15:54, PREVIOUS ECG IS PRESENT  Echo N/A  ASSESSMENT & PLAN CHA2DS2-VASc Score = 1  The patient's score is based upon: CHF History: 0 HTN History: 1 Diabetes History: 0 Stroke History: 0 Vascular Disease History: 0 Age Score: 0 Gender Score: 0       ASSESSMENT AND PLAN: Paroxysmal Atrial Flutter The patient's CHA2DS2-VASc score is 1, indicating a 0.6% annual risk of stroke.    He is currently in NSR Education provided about Afib. Discussion about medication treatments and ablation going forward if indicated. After discussion, we will proceed with placing cardiac monitor for 2 weeks to determine burden / paroxysmal episodes. Considering ED concern for toxicity related to anesthetic from dentist, it appears lower probability this was the etiology due to lack of other symptoms related to LAST diagnosis.   Continue Lopressor 12.5 mg BID. Continue Eliquis 5 mg BID. We will continue medication until more information is known about his burden. Wife over phone states she had a bad experience with blood thinner and would like him off of it as soon as possible. Patient states will continue for now.   Will schedule echocardiogram.   Follow up 6 weeks to discuss results.    [place] Bells, PA-C  Afib [facility] [address] [place], [place] [address] [phone]

## 2023-07-10 ENCOUNTER — Telehealth (HOSPITAL_COMMUNITY): Payer: Self-pay | Admitting: *Deleted

## 2023-07-10 MED ORDER — RIVAROXABAN 20 MG PO TABS: 20.0000 mg | ORAL_TABLET | Freq: Every day | ORAL | 3 refills | Status: DC

## 2023-07-10 NOTE — Telephone Encounter (Addendum)
 Pt wife called in stating patient is having severe pain in his legs and arms since cardioversion. Saw pcp today but labs/assessment unremarkable. Discussed with Daril Kicks PA will stop Eliquis  and start Xarelto  20mg  once a day to see if this will help his symptoms. Wife verbalized agreement.

## 2023-07-31 ENCOUNTER — Ambulatory Visit (HOSPITAL_COMMUNITY): Payer: Medicare HMO

## 2023-08-01 NOTE — Addendum Note (Signed)
Encounter addended by: Eustace Pen, PA-C on: 08/01/2023 11:42 AM  Actions taken: Clinical Note Signed

## 2023-08-07 ENCOUNTER — Ambulatory Visit (HOSPITAL_COMMUNITY): Payer: Medicare HMO | Admitting: Internal Medicine

## 2023-08-07 ENCOUNTER — Encounter (HOSPITAL_COMMUNITY): Payer: Self-pay

## 2023-08-07 ENCOUNTER — Ambulatory Visit (HOSPITAL_COMMUNITY)
Admission: RE | Admit: 2023-08-07 | Discharge: 2023-08-07 | Disposition: A | Discharge status: Home / Self Care | Payer: Medicare HMO | Source: Ambulatory Visit | Attending: Internal Medicine | Admitting: Internal Medicine

## 2023-08-07 DIAGNOSIS — I10 Essential (primary) hypertension: Secondary | ICD-10-CM | POA: Diagnosis not present

## 2023-08-07 DIAGNOSIS — G4730 Sleep apnea, unspecified: Secondary | ICD-10-CM | POA: Diagnosis not present

## 2023-08-07 DIAGNOSIS — I4891 Unspecified atrial fibrillation: Secondary | ICD-10-CM | POA: Diagnosis present

## 2023-08-07 DIAGNOSIS — E669 Obesity, unspecified: Secondary | ICD-10-CM | POA: Diagnosis not present

## 2023-08-07 LAB — ECHOCARDIOGRAM COMPLETE
AR max vel: 2.28 cm2
AV Area VTI: 2.41 cm2
AV Area mean vel: 2.16 cm2
AV Mean grad: 5 mm[Hg]
AV Peak grad: 9.5 mm[Hg]
Ao pk vel: 1.54 m/s
Area-P 1/2: 3.23 cm2
S' Lateral: 3.3 cm
Single Plane A4C EF: 51 %

## 2023-08-07 NOTE — Progress Notes (Signed)
  Echocardiogram 2D Echocardiogram has been performed.  Cody Hampton 08/07/2023, 11:29 AM

## 2023-08-08 ENCOUNTER — Encounter (HOSPITAL_COMMUNITY): Payer: Self-pay | Admitting: Internal Medicine

## 2023-08-08 ENCOUNTER — Ambulatory Visit (HOSPITAL_COMMUNITY)
Admission: RE | Admit: 2023-08-08 | Discharge: 2023-08-08 | Disposition: A | Discharge status: Home / Self Care | Payer: Medicare HMO | Source: Ambulatory Visit | Attending: Internal Medicine | Admitting: Internal Medicine

## 2023-08-08 VITALS — BP 138/88 | HR 71 | Ht 70.0 in | Wt >= 6400 oz

## 2023-08-08 DIAGNOSIS — I10 Essential (primary) hypertension: Secondary | ICD-10-CM | POA: Insufficient documentation

## 2023-08-08 DIAGNOSIS — Z6841 Body Mass Index (BMI) 40.0 and over, adult: Secondary | ICD-10-CM | POA: Diagnosis not present

## 2023-08-08 DIAGNOSIS — E669 Obesity, unspecified: Secondary | ICD-10-CM | POA: Insufficient documentation

## 2023-08-08 DIAGNOSIS — Z79899 Other long term (current) drug therapy: Secondary | ICD-10-CM | POA: Insufficient documentation

## 2023-08-08 DIAGNOSIS — I4892 Unspecified atrial flutter: Secondary | ICD-10-CM | POA: Diagnosis not present

## 2023-08-08 DIAGNOSIS — I4891 Unspecified atrial fibrillation: Secondary | ICD-10-CM | POA: Diagnosis not present

## 2023-08-08 DIAGNOSIS — I480 Paroxysmal atrial fibrillation: Secondary | ICD-10-CM | POA: Diagnosis present

## 2023-08-08 DIAGNOSIS — Z7901 Long term (current) use of anticoagulants: Secondary | ICD-10-CM | POA: Insufficient documentation

## 2023-08-08 NOTE — Progress Notes (Addendum)
 Primary Care Physician: Mattie Marlin, DO Primary Cardiologist: None Electrophysiologist: None     Referring Physician: ED     Cody Hampton is a 63 y.o. male with a history of HTN, obesity, OSA, and paroxysmal atrial fibrillation who presents for consultation in the [facility]. ED visit on 06/26/23 for new onset atrial flutter with RVR s/p successful DCCV but was noted to return into Afib HR 80. Patient notes it occurred while at dentist, concerning for local anesthetic systemic toxicity (LAST). He did not appear to have other symptoms of LAST. Discharged on Lopressor 12.5 mg BID. Patient is on Eliquis 5 mg BID for a CHADS2VASC score of 1.  On evaluation today, he is currently in NSR. Patient did not know he had returned into Afib after cardioversion. He has history of OSA. He does not drink alcohol. He does admit to stressful environment when taking son for his cancer treatment. He drinks 1 cup of coffee daily and then 54 oz of half no caffeine / half caffeinated soda. He does not know if he has had any other episodes of Afib since ED visit. He has been compliant with Lopressor 12.5 mg BID and Eliquis 5 mg BID.   On follow up 08/08/23, he is currently in NSR. He has had no episodes of Afib since last office visit. He stopped the Eliquis due to cost.   Today, he denies symptoms of palpitations, chest pain, shortness of breath, orthopnea, PND, lower extremity edema, dizziness, presyncope, syncope, snoring, daytime somnolence, bleeding, or neurologic sequela. The patient is tolerating medications without difficulties and is otherwise without complaint today.    Atrial Fibrillation Risk Factors:  he does have symptoms or diagnosis of sleep apnea.  he has a BMI of Body mass index is 59.55 kg/m.Marland Kitchen Filed Weights   08/08/23 1327  Weight: (!) 188.2 kg     Current Outpatient Medications  Medication Sig Dispense Refill   amoxicillin (AMOXIL) 500 MG capsule Take  500 mg by mouth 3 (three) times daily.     apixaban (ELIQUIS) 5 MG TABS tablet Take 5 mg by mouth 2 (two) times daily.     citalopram (CELEXA) 20 MG tablet Take 1 tablet by mouth daily.     Cyanocobalamin (VITAMIN B12 SL) Place 1 tablet under the tongue in the morning.     lisinopril (ZESTRIL) 40 MG tablet Take 40 mg by mouth daily.     loperamide (IMODIUM A-D) 2 MG tablet Take 2-4 mg by mouth See admin instructions. Take 4 mg by mouth in the morning and an additional 2 mg twice a day as needed for diarrhea or loose stools     metoprolol tartrate (LOPRESSOR) 25 MG tablet Take 0.5 tablets (12.5 mg total) by mouth 2 (two) times daily. 30 tablet 2   Multiple Vitamins-Minerals (BARIATRIC MULTIVITAMINS/IRON) CAPS Take 1 capsule by mouth daily with breakfast.     oxybutynin (DITROPAN XL) 15 MG 24 hr tablet Take 15 mg by mouth daily.     oxyCODONE-acetaminophen (PERCOCET/ROXICET) 5-325 MG tablet Take 1 tablet by mouth See admin instructions. Take 1 tablet by mouth in the morning and an additional 1 tablet once a day as needed for pain     pantoprazole (PROTONIX) 40 MG tablet Take 40 mg by mouth daily as needed (for reflux or GERD symptoms).     polycarbophil (FIBERCON) 625 MG tablet Take 625 mg by mouth daily.     No current facility-administered medications for this  encounter.    Atrial Fibrillation Management history:  Previous antiarrhythmic drugs: none Previous cardioversions: 06/26/23 (ED) Previous ablations: none Anticoagulation history: Eliquis   ROS- All systems are reviewed and negative except as per the HPI above.  Physical Exam: BP 138/88   Pulse 71   Ht 5\' 10"  (1.778 m)   Wt (!) 188.2 kg   BMI 59.55 kg/m   GEN- The patient is well appearing, alert and oriented x 3 today. Obese habitus.   Neck - no JVD or carotid bruit noted Lungs- Clear to ausculation bilaterally, normal work of breathing Heart- Regular rate and rhythm, no murmurs, rubs or gallops, PMI not laterally  displaced Extremities- no clubbing, cyanosis, or edema Skin - no rash or ecchymosis noted   EKG today demonstrates  Vent. rate 71 BPM PR interval 144 ms QRS duration 92 ms QT/QTcB 380/412 ms P-R-T axes 13 -36 -7 Normal sinus rhythm Left axis deviation Cannot rule out Anterior infarct , age undetermined Abnormal ECG When compared with ECG of 05-Jul-2023 14:40, PREVIOUS ECG IS PRESENT  Echo 08/07/23: 1. Left ventricular ejection fraction, by estimation, is 60 to 65%. The  left ventricle has normal function. The left ventricle has no regional  wall motion abnormalities. There is mild asymmetric left ventricular  hypertrophy of the septal segment. Left  ventricular diastolic parameters were normal.   2. Right ventricular systolic function is normal. The right ventricular  size is severely enlarged. There is normal pulmonary artery systolic  pressure.   3. Right atrial size was mildly dilated.   4. The mitral valve is normal in structure. No evidence of mitral valve  regurgitation. No evidence of mitral stenosis.   5. The aortic valve is tricuspid. Aortic valve regurgitation is not  visualized. No aortic stenosis is present.   Cardiac monitor 06/2023-07/2023: Patch Wear Time:  13 days and 23 hours   HR 49 - 211, average 71 bpm. No atrial fibrillation detected. Occasional supraventricular ectopy, 3.5%. Rare ventricular ectopy. No sustained arrhythmias. No symptom trigger episodes. 41 SVT episodes, all less than 12 seconds     [facility] Cardiac Electrophysiology   ASSESSMENT & PLAN CHA2DS2-VASc Score = 1  The patient's score is based upon: CHF History: 0 HTN History: 1 Diabetes History: 0 Stroke History: 0 Vascular Disease History: 0 Age Score: 0 Gender Score: 0       ASSESSMENT AND PLAN: Paroxysmal Atrial Flutter The patient's CHA2DS2-VASc score is 1, indicating a 0.6% annual risk of stroke.    He is currently in NSR. He will continue Lopressor 12.5  mg BID. He has fatigue, which he is unsure if related to Lopressor or other medication. He is going to decide whether to remain on medication or try something different. We can consider diltiazem 30 mg BID if he wishes to stop metoprolol. Options going forward for rhythm control would possibly be Multaq, flecainide, or Tikosyn. He is going to stop Eliquis due to low risk score, cardiac monitor showing no Afib, and due to cost which is reasonable.    Follow up Afib clinic prn.   Lake Bells, PA-C  [facility] [address] [place], [place] [address] [phone]

## 2023-09-07 ENCOUNTER — Encounter: Payer: Self-pay | Admitting: Hematology

## 2023-09-07 ENCOUNTER — Telehealth (HOSPITAL_COMMUNITY): Payer: Self-pay | Admitting: *Deleted

## 2023-09-07 MED ORDER — DILTIAZEM HCL ER COATED BEADS 120 MG PO CP24
120.0000 mg | ORAL_CAPSULE | Freq: Every day | ORAL | 3 refills | Status: DC
Start: 2023-09-07 — End: 2023-09-26

## 2023-09-07 NOTE — Telephone Encounter (Signed)
 Patient wife called in stating patient continues to have fatigue they believe is related to metoprolol and would like to switch to different agent.  Discussed with Landry Mellow PA will stop metoprolol and start cardizem 120mg  once a day. Pt wife verbalized understanding.

## 2023-09-11 ENCOUNTER — Telehealth: Payer: Self-pay

## 2023-09-11 NOTE — Telephone Encounter (Signed)
 Received referral from Dr Vivi Ferns at [facility] for OSA. Notes from 2/27 in chart do mention hx of CPAP use and need for f/u study. Placed in sleep mailbox.

## 2023-09-24 ENCOUNTER — Encounter: Payer: Self-pay | Admitting: Hematology

## 2023-09-25 ENCOUNTER — Encounter (HOSPITAL_COMMUNITY): Payer: Self-pay

## 2023-09-26 ENCOUNTER — Other Ambulatory Visit (HOSPITAL_COMMUNITY): Payer: Self-pay | Admitting: *Deleted

## 2023-09-26 ENCOUNTER — Telehealth (HOSPITAL_COMMUNITY): Payer: Self-pay | Admitting: *Deleted

## 2023-09-26 MED ORDER — DILTIAZEM HCL ER COATED BEADS 120 MG PO CP24: 120.0000 mg | ORAL_CAPSULE | Freq: Two times a day (BID) | ORAL | 3 refills | Status: DC

## 2023-09-26 MED ORDER — APIXABAN 5 MG PO TABS: 5.0000 mg | ORAL_TABLET | Freq: Two times a day (BID) | ORAL | 3 refills | Status: DC

## 2023-09-26 NOTE — Telephone Encounter (Signed)
 Received EKG from pcp office via fax pt back in af HR 148 per Landry Mellow PA resume Eliquis 5mg  twice a day and follow up appt. Spoke with patients wife, Rosey Bath, recommendations reviewed. HR today is 109 BP 138/77. Appt made for tomorrow.

## 2023-09-26 NOTE — Addendum Note (Signed)
 Encounter addended by: Eustace Pen, PA-C on: 09/26/2023 10:54 AM  Actions taken: Clinical Note Signed

## 2023-09-27 ENCOUNTER — Emergency Department (HOSPITAL_COMMUNITY)
Admission: EM | Admit: 2023-09-27 | Discharge: 2023-09-27 | Disposition: A | Discharge status: Home / Self Care | Attending: Emergency Medicine | Admitting: Emergency Medicine

## 2023-09-27 ENCOUNTER — Other Ambulatory Visit: Payer: Self-pay

## 2023-09-27 ENCOUNTER — Ambulatory Visit (HOSPITAL_COMMUNITY)
Admission: RE | Admit: 2023-09-27 | Discharge: 2023-09-27 | Discharge status: Home / Self Care | Source: Ambulatory Visit | Attending: Internal Medicine | Admitting: Internal Medicine

## 2023-09-27 VITALS — HR 147 | Ht 70.0 in | Wt >= 6400 oz

## 2023-09-27 DIAGNOSIS — I4892 Unspecified atrial flutter: Secondary | ICD-10-CM | POA: Insufficient documentation

## 2023-09-27 DIAGNOSIS — E669 Obesity, unspecified: Secondary | ICD-10-CM | POA: Insufficient documentation

## 2023-09-27 DIAGNOSIS — I4891 Unspecified atrial fibrillation: Secondary | ICD-10-CM | POA: Diagnosis not present

## 2023-09-27 DIAGNOSIS — Z6841 Body Mass Index (BMI) 40.0 and over, adult: Secondary | ICD-10-CM | POA: Insufficient documentation

## 2023-09-27 DIAGNOSIS — I480 Paroxysmal atrial fibrillation: Secondary | ICD-10-CM | POA: Insufficient documentation

## 2023-09-27 DIAGNOSIS — I10 Essential (primary) hypertension: Secondary | ICD-10-CM | POA: Diagnosis not present

## 2023-09-27 DIAGNOSIS — Z7901 Long term (current) use of anticoagulants: Secondary | ICD-10-CM | POA: Insufficient documentation

## 2023-09-27 DIAGNOSIS — R0602 Shortness of breath: Secondary | ICD-10-CM | POA: Diagnosis present

## 2023-09-27 DIAGNOSIS — G4733 Obstructive sleep apnea (adult) (pediatric): Secondary | ICD-10-CM | POA: Insufficient documentation

## 2023-09-27 DIAGNOSIS — Z6379 Other stressful life events affecting family and household: Secondary | ICD-10-CM | POA: Insufficient documentation

## 2023-09-27 LAB — CBC
HCT: 45.7 % (ref 39.0–52.0)
Hemoglobin: 14.6 g/dL (ref 13.0–17.0)
MCH: 29.2 pg (ref 26.0–34.0)
MCHC: 31.9 g/dL (ref 30.0–36.0)
MCV: 91.4 fL (ref 80.0–100.0)
Platelets: 416 10*3/uL — ABNORMAL HIGH (ref 150–400)
RBC: 5 MIL/uL (ref 4.22–5.81)
RDW: 14.1 % (ref 11.5–15.5)
WBC: 15 10*3/uL — ABNORMAL HIGH (ref 4.0–10.5)
nRBC: 0 % (ref 0.0–0.2)

## 2023-09-27 LAB — BASIC METABOLIC PANEL
Anion gap: 8 (ref 5–15)
BUN: 10 mg/dL (ref 8–23)
CO2: 23 mmol/L (ref 22–32)
Calcium: 8.4 mg/dL — ABNORMAL LOW (ref 8.9–10.3)
Chloride: 102 mmol/L (ref 98–111)
Creatinine, Ser: 0.77 mg/dL (ref 0.61–1.24)
GFR, Estimated: >60 mL/min (ref >60)
Glucose, Bld: 134 mg/dL — ABNORMAL HIGH (ref 70–99)
Potassium: 4.4 mmol/L (ref 3.5–5.1)
Sodium: 133 mmol/L — ABNORMAL LOW (ref 135–145)

## 2023-09-27 LAB — TSH: TSH: 4.532 u[IU]/mL — ABNORMAL HIGH (ref 0.350–4.500)

## 2023-09-27 LAB — MAGNESIUM: Magnesium: 1.8 mg/dL (ref 1.7–2.4)

## 2023-09-27 MED ORDER — DILTIAZEM LOAD VIA INFUSION
20.0000 mg | Freq: Once | INTRAVENOUS | Status: AC
  Administered 2023-09-27: 20 mg via INTRAVENOUS
  Filled 2023-09-27: qty 20

## 2023-09-27 MED ORDER — BISOPROLOL FUMARATE 5 MG PO TABS: 5.0000 mg | ORAL_TABLET | Freq: Every day | ORAL | 0 refills | Status: DC

## 2023-09-27 MED ORDER — BISOPROLOL FUMARATE 10 MG PO TABS
10.0000 mg | ORAL_TABLET | Freq: Every day | ORAL | Status: DC
  Filled 2023-09-27: qty 1

## 2023-09-27 MED ORDER — MAGNESIUM SULFATE 2 GM/50ML IV SOLN
2.0000 g | Freq: Once | INTRAVENOUS | Status: AC
  Administered 2023-09-27: 2 g via INTRAVENOUS
  Filled 2023-09-27: qty 50

## 2023-09-27 MED ORDER — METOPROLOL TARTRATE 25 MG PO TABS
25.0000 mg | ORAL_TABLET | Freq: Two times a day (BID) | ORAL | Status: DC
  Filled 2023-09-27: qty 1

## 2023-09-27 MED ORDER — METOPROLOL TARTRATE 25 MG PO TABS
75.0000 mg | ORAL_TABLET | Freq: Two times a day (BID) | ORAL | Status: DC
Start: 2023-09-27 — End: 2023-09-27
  Filled 2023-09-27: qty 3

## 2023-09-27 MED ORDER — DILTIAZEM HCL-DEXTROSE 125-5 MG/125ML-% IV SOLN (PREMIX)
5.0000 mg/h | INTRAVENOUS | Status: DC
  Administered 2023-09-27: 5 mg/h via INTRAVENOUS
  Filled 2023-09-27: qty 125

## 2023-09-27 MED ORDER — APIXABAN 5 MG PO TABS
5.0000 mg | ORAL_TABLET | Freq: Two times a day (BID) | ORAL | Status: DC
  Administered 2023-09-27: 5 mg via ORAL
  Filled 2023-09-27: qty 1

## 2023-09-27 MED ORDER — BISOPROLOL FUMARATE 5 MG PO TABS
5.0000 mg | ORAL_TABLET | Freq: Every day | ORAL | Status: DC
  Administered 2023-09-27: 5 mg via ORAL
  Filled 2023-09-27: qty 1

## 2023-09-27 MED ORDER — DILTIAZEM HCL 60 MG PO TABS
120.0000 mg | ORAL_TABLET | Freq: Two times a day (BID) | ORAL | Status: DC
  Filled 2023-09-27 (×2): qty 2

## 2023-09-27 NOTE — Consult Note (Signed)
 ELECTROPHYSIOLOGY CONSULT NOTE    Patient ID: Cody Hampton MRN: [id number], DOB/AGE: 1961/01/27 63 y.o.  Admit date: 09/27/2023 Date of Consult: 09/27/2023  Primary Physician: Mattie Marlin, DO Primary Cardiologist: None  Electrophysiologist: New   Referring Provider: Dr. Jeraldine Loots  Patient Profile: Cody Hampton is a 63 y.o. male with a history of HTN, obesity, OSA, and atrial flutter who is being seen today for the evaluation of AFL with RVR at the request of Dr. Jeraldine Loots.  HPI:  Cody Hampton is a 63 y.o. male who has been followed in the AF clinic after new AF flutter diagnosed 06/2023 after a dental procedure.    Has been intermittently on Eliquis 5 mg BID with CHA2DS2VASc of at least 1, and some limitation due to cost. He also added he stopped it because he felt it was affecting his 'chemistry'.   AF clinic contacted 3/19 with reports of tachycardia from PCP office; EKG showed AFL with RVR. Eliquis was sent to pharmacy but pt did not yet pick up.  Seen in AF clinic this am, and with RVR was sent to ED to consider urgent cardioversion; this was deferred for EP evaluation given that pt is off of OAC currently.    Pt currently asymptomatic at rest. Denies syncope, SOB, lightheadedness, dizziness, or chest pain. Can "tell" when his heart is going fast, but reports HRs > 100 at home for at least the last several weeks with no overall change in his functional status.   Labs Potassium4.4 (03/20 1052) Magnesium  1.8 (03/20 1052) Creatinine, ser  0.77 (03/20 1052) PLT  416* (03/20 1052) HGB  14.6 (03/20 1052) WBC 15.0* (03/20 1052)  .    Allergies, Medical, Surgical, Social, and Family Histories have been reviewed and are referenced here-in when relevant for medical decision making.    Physical Exam: Vitals:   09/27/23 1019 09/27/23 1021 09/27/23 1030 09/27/23 1145  BP:  (!) 142/104 (!) 141/104 127/77  Pulse:  (!) 143 (!) 143 (!) 124  Resp:  (!) 22 12 16   Temp:  98.8 F (37.1  C)    TempSrc:  Oral    SpO2:  96% 94% 94%  Weight: (!) 190.5 kg     Height: 5\' 10"  (1.778 m)       GEN- NAD, A&O x 3, normal affect HEENT: Normocephalic, atraumatic Lungs- CTAB, Normal effort.  Heart- Irregular rate and rhythm currently, No M/G/R.  GI- Soft, NT, ND.  Extremities- No clubbing, cyanosis, or edema   Radiology/Studies: No results found.  EKG:on arrival showed AFL with RVR in 140s (personally reviewed)  TELEMETRY: AFL with variable conduction in 110-130s (personally reviewed)  Assessment/Plan:  Persistent atrial flutter Likely for past several weeks by home pulses.  CHA2DS2/VASc is at least 2 with DM2.  Not candidate for advanced EP procedures such as ablation with super morbid obesity. Also poor candidate for TEE given body habitus.  Will increase lopressor to 75 mg BID and can go higher if needed.  Resume Eliquis 5 mg BID. Will need to remain on. Consider manufacturer assistance. States $45 a month.  He was briefly on diltiazem and lost his IV. He had a good response raising some concern for compliance.  Goal resting HR < 110.     Obesity Body mass index is 60.26 kg/m.  Encouraged lifestyle modification  Dr. Nelly Laurence has seen.  Would dose Lopressor and monitor HRs for improvement, then can likely go home today.   Close follow up with AF clinic  arranged.   For questions or updates, please contact [facility] Please consult [email or web address] for contact info under Cardiology/STEMI.  Dustin Flock, PA-C  09/27/2023 1:15 PM

## 2023-09-27 NOTE — Discharge Instructions (Addendum)
 You came to the emergency room for fast heart rate and were found to be in atrial flutter.  You were restarted on a blood thinner called Eliquis.  You were also given an IV medication called diltiazem and then transition to an oral medication called bisoprolol.  Do not take your diltiazem at home any longer.  Be sure to follow-up soon with your cardiologist in the A-fib clinic as well as her primary doctor and return to the ED if your symptoms return before then.

## 2023-09-27 NOTE — ED Notes (Signed)
 Awaiting diltiazem from main pharmacy.

## 2023-09-27 NOTE — ED Provider Notes (Signed)
 [place] EMERGENCY DEPARTMENT AT [facility] Provider Note   CSN: [id number] Arrival date & time: 09/27/23  1006     History  Chief Complaint  Patient presents with   Irregular Heart Beat    Cody Hampton is a 63 y.o. male.  HPI  Presents from A-fib clinic after he was found to be in RVR and experiencing diaphoresis, shortness of breath, lightheadedness, and thready pulses.  Patient states he has been having symptoms for the last couple of days that been worsening.  He recently increased his diltiazem to 120 mg on Monday at the direction of his cardiologist to help control his rates; however, this has not been helping.  He has been off of Eliquis now for multiple months due to cost.  No fevers, chest pain, nausea, vomiting, bowel changes, urinary symptoms.     Home Medications Prior to Admission medications   Medication Sig Start Date End Date Taking? Authorizing Provider  bisoprolol (ZEBETA) 5 MG tablet Take 1 tablet (5 mg total) by mouth daily. 09/27/23  Yes Alenna Russell, Earvin Hansen, MD  apixaban (ELIQUIS) 5 MG TABS tablet Take 1 tablet (5 mg total) by mouth 2 (two) times daily. 09/26/23   Eustace Pen, PA-C  citalopram (CELEXA) 20 MG tablet Take 1 tablet by mouth 2 (two) times daily. 06/28/23   [provider]  diltiazem (CARDIZEM CD) 120 MG 24 hr capsule Take 1 capsule (120 mg total) by mouth 2 (two) times daily. 09/26/23   Eustace Pen, PA-C  loperamide (IMODIUM A-D) 2 MG tablet Take 2 mg by mouth daily. Will take additional if needed    [provider]  Multiple Vitamins-Minerals (BARIATRIC MULTIVITAMINS/IRON) CAPS Take 1 capsule by mouth daily with breakfast.    [provider]  oxyCODONE-acetaminophen (PERCOCET/ROXICET) 5-325 MG tablet Take 1 tablet by mouth as needed. Take 1 tablet by mouth in the morning and an additional 1 tablet once a day as needed for pain    [provider]  pantoprazole (PROTONIX) 40 MG tablet Take 40 mg by mouth  as needed (for reflux or GERD symptoms).    [provider]  polycarbophil (FIBERCON) 625 MG tablet Take 625 mg by mouth daily.    [provider]  testosterone cypionate (DEPOTESTOSTERONE CYPIONATE) 200 MG/ML injection Inject into the muscle every 14 (fourteen) days. 09/14/23   [provider]      Allergies    Duloxetine, Gabapentin, and Hydrocodone    Review of Systems   Review of Systems  Constitutional:  Positive for diaphoresis and fatigue. Negative for fever.  Respiratory:  Positive for shortness of breath.   Cardiovascular:  Positive for palpitations. Negative for chest pain.    Physical Exam Updated Vital Signs BP (!) 146/98 (BP Location: Right Arm)   Pulse (!) 118   Temp 98.5 F (36.9 C) (Oral)   Resp 20   Ht 5\' 10"  (1.778 m)   Wt (!) 190.5 kg   SpO2 95%   BMI 60.26 kg/m  Physical Exam Constitutional:      General: He is not in acute distress.    Appearance: Normal appearance. He is obese. He is diaphoretic.  HENT:     Head: Normocephalic and atraumatic.     Mouth/Throat:     Mouth: Mucous membranes are moist.  Eyes:     Extraocular Movements: Extraocular movements intact.     Conjunctiva/sclera: Conjunctivae normal.  Cardiovascular:     Rate and Rhythm: Regular rhythm. Tachycardia present.  Heart sounds: Normal heart sounds.     Comments: Thready radial pulses intermittently bilaterally. Pulmonary:     Effort: Pulmonary effort is normal. No respiratory distress.     Breath sounds: Normal breath sounds.  Abdominal:     General: Bowel sounds are normal. There is no distension.     Palpations: Abdomen is soft.     Tenderness: There is no abdominal tenderness.  Musculoskeletal:     Cervical back: Normal range of motion.  Skin:    General: Skin is warm.  Neurological:     General: No focal deficit present.     Mental Status: He is alert and oriented to person, place, and time.  Psychiatric:        Mood and Affect: Mood normal.         Behavior: Behavior normal.     ED Results / Procedures / Treatments   Labs (all labs ordered are listed, but only abnormal results are displayed) Labs Reviewed  CBC - Abnormal; Notable for the following components:      Result Value   WBC 15.0 (*)    Platelets 416 (*)    All other components within normal limits  BASIC METABOLIC PANEL - Abnormal; Notable for the following components:   Sodium 133 (*)    Glucose, Bld 134 (*)    Calcium 8.4 (*)    All other components within normal limits  TSH - Abnormal; Notable for the following components:   TSH 4.532 (*)    All other components within normal limits  MAGNESIUM    EKG EKG Interpretation Date/Time:  Thursday September 27 2023 10:22:32 EDT Ventricular Rate:  143 PR Interval:  88 QRS Duration:  103 QT Interval:  284 QTC Calculation: 438 R Axis:   -64  Text Interpretation: aflutter ST-t wave abnormality Abnormal ECG Confirmed by Gerhard Munch ([id number]) on 09/27/2023 10:39:31 AM  Radiology No results found.  Procedures Procedures    Medications Ordered in ED Medications  apixaban (ELIQUIS) tablet 5 mg (5 mg Oral Given 09/27/23 1148)  bisoprolol (ZEBETA) tablet 5 mg (has no administration in time range)  diltiazem (CARDIZEM) 1 mg/mL load via infusion 20 mg (20 mg Intravenous Bolus from Bag 09/27/23 1118)  magnesium sulfate IVPB 2 g 50 mL (0 g Intravenous Stopped 09/27/23 1457)    ED Course/ Medical Decision Making/ A&P                                 Medical Decision Making Amount and/or Complexity of Data Reviewed Labs: ordered.  Risk Prescription drug management.   63 year old male with a history of anxiety, depression, BMI 60, GERD, MGUS, OSA, A-fib/A-flutter here with a flutter and rates in the 140s.  In addition to tachycardia, vitals notable for hypertension and tachypnea with normal SpO2.  Exam notable for diaphoresis, normal heart sounds, and intermittently thready bilateral radial pulses.  EKG on  arrival demonstrates likely regularized atrial flutter without signs of ischemia.  He is breathing comfortably on room air when at rest.  Unable to cardiovert at this time given patient has not been on anticoagulation.  Will give diltiazem 20 mg load and begin diltiazem infusion for rate control.    CBC with leukocytosis to 15 without anemia.  BMP with normal potassium and creatinine.  Magnesium normal at 1.8; however, will replete with 2 g to reach goal of 2.  TSH borderline elevated at 4.532.  Cardiology  consulted for further recommendations.  Discontinue diltiazem drip and started bisoprolol 5 mg daily as well as Eliquis 5 mg twice daily.  Would also add diltiazem 120 mg twice daily.  Discussed if rates were controlled later this afternoon, patient could be discharged and follow-up outpatient.  Heart rates improved after addition of bisoprolol 5 mg daily status post diltiazem.  Feel he is safe for discharge with outpatient follow-up in the A-fib clinic.  Patient understanding and in agreement.        Final Clinical Impression(s) / ED Diagnoses Final diagnoses:  Atrial flutter with rapid ventricular response (HCC)    Rx / DC Orders ED Discharge Orders          Ordered    bisoprolol (ZEBETA) 5 MG tablet  Daily        09/27/23 1457              Evette Georges, MD 09/27/23 1458    Gerhard Munch, MD 10/03/23 [phone]

## 2023-09-27 NOTE — ED Notes (Signed)
 PA Mardelle Matte made aware of patient's potential reaction at IV site. Patient endorses itching above IV site. Redness and rash noted by RN. Pharmacist Alinda Money made aware and visualized, MD aware.

## 2023-09-27 NOTE — Progress Notes (Signed)
  Pt refusing lopressor due to prior fatigue. States oral diltiazem provided little benefit, but had not been compliant. PCP attempted to "uptitrate" to 240 when seen earlier this week, but pt did not have 120 on hand.   After speaking with his wife, they are agreeable to trying bisoprolol.  Will start with 5 mg with his reported poor BB tolerance and can titrate to effect.   Not candidate for flecainide or tikosyn with non-compliance issues. Not currently candidate for amiodarone off OAC.   Still OK for discharge later this afternoon if remains asymptomatic and HRs improve after BB dosing.     Casimiro Needle [id number] [place], PA-C  09/27/2023 2:28 PM

## 2023-09-27 NOTE — ED Notes (Signed)
 Cardiology at bedside.

## 2023-09-27 NOTE — Addendum Note (Signed)
 Encounter addended by: Eustace Pen, PA-C on: 09/27/2023 11:46 AM  Actions taken: Clinical Note Signed

## 2023-09-27 NOTE — ED Triage Notes (Signed)
 Patient arrives from afib clinic for aflutter, rate in 140. Patient is diaphoretic and short of breath at time of arrival. Patient endorses beginning diltiazem for rate control x weeks ago without symptom relief. Patient is alert and oriented x4.

## 2023-09-27 NOTE — Progress Notes (Addendum)
 Primary Care Physician: Mattie Marlin, DO Primary Cardiologist: None Electrophysiologist: None     Referring Physician: ED     Cody Hampton is a 63 y.o. male with a history of HTN, obesity, OSA, and paroxysmal atrial fibrillation who presents for consultation in the [facility]. ED visit on 06/26/23 for new onset atrial flutter with RVR s/p successful DCCV but was noted to return into Afib HR 80. Patient notes it occurred while at dentist, concerning for local anesthetic systemic toxicity (LAST). He did not appear to have other symptoms of LAST. Discharged on Lopressor 12.5 mg BID. Patient is on Eliquis 5 mg BID for a CHADS2VASC score of 1.  On evaluation today, he is currently in NSR. Patient did not know he had returned into Afib after cardioversion. He has history of OSA. He does not drink alcohol. He does admit to stressful environment when taking son for his cancer treatment. He drinks 1 cup of coffee daily and then 54 oz of half no caffeine / half caffeinated soda. He does not know if he has had any other episodes of Afib since ED visit. He has been compliant with Lopressor 12.5 mg BID and Eliquis 5 mg BID.   On follow up 08/08/23, he is currently in NSR. He has had no episodes of Afib since last office visit. He stopped the Eliquis due to cost.   On follow up 09/27/23, he is currently in atrial flutter with RVR. Wife contacted office noting patient was in sinus tachycardia at PCP office with HR 148. He is planning to have major dental work for removal of multiple upper teeth. We called diltiazem 120 mg daily yesterday to pharmacy but patient has not picked up yet. He will pick up Eliquis later today as they typically go to the pharmacy every Thursday. He feels lightheaded and dizzy at times.  Today, he denies symptoms of palpitations, chest pain, shortness of breath, orthopnea, PND, lower extremity edema, dizziness, presyncope, syncope, snoring, daytime  somnolence, bleeding, or neurologic sequela. The patient is tolerating medications without difficulties and is otherwise without complaint today.    Atrial Fibrillation Risk Factors:  he does have symptoms or diagnosis of sleep apnea.  he has a BMI of Body mass index is 60.38 kg/m.Marland Kitchen Filed Weights   09/27/23 0927  Weight: (!) 190.9 kg    Current Outpatient Medications  Medication Sig Dispense Refill   apixaban (ELIQUIS) 5 MG TABS tablet Take 1 tablet (5 mg total) by mouth 2 (two) times daily. 60 tablet 3   citalopram (CELEXA) 20 MG tablet Take 1 tablet by mouth 2 (two) times daily.     diltiazem (CARDIZEM CD) 120 MG 24 hr capsule Take 1 capsule (120 mg total) by mouth 2 (two) times daily. 60 capsule 3   loperamide (IMODIUM A-D) 2 MG tablet Take 2 mg by mouth daily. Will take additional if needed     Multiple Vitamins-Minerals (BARIATRIC MULTIVITAMINS/IRON) CAPS Take 1 capsule by mouth daily with breakfast.     oxyCODONE-acetaminophen (PERCOCET/ROXICET) 5-325 MG tablet Take 1 tablet by mouth as needed. Take 1 tablet by mouth in the morning and an additional 1 tablet once a day as needed for pain     pantoprazole (PROTONIX) 40 MG tablet Take 40 mg by mouth as needed (for reflux or GERD symptoms).     polycarbophil (FIBERCON) 625 MG tablet Take 625 mg by mouth daily.     testosterone cypionate (DEPOTESTOSTERONE CYPIONATE) 200 MG/ML injection  Inject into the muscle every 14 (fourteen) days.     No current facility-administered medications for this encounter.    Atrial Fibrillation Management history:  Previous antiarrhythmic drugs: none Previous cardioversions: 06/26/23 (ED) Previous ablations: none Anticoagulation history: Eliquis   ROS- All systems are reviewed and negative except as per the HPI above.  Physical Exam: Pulse (!) 147   Ht 5\' 10"  (1.778 m)   Wt (!) 190.9 kg   BMI 60.38 kg/m   GEN- The patient is ill appearing, alert and oriented x 3 today. He is diaphoretic.    Neck - no JVD or carotid bruit noted Lungs- Clear to ausculation bilaterally, normal work of breathing Heart- Tachycardic irregular rate and rhythm, no murmurs, rubs or gallops, PMI not laterally displaced Extremities- no clubbing, cyanosis, or edema Skin - no rash or ecchymosis noted   EKG today demonstrates  Vent. rate 147 BPM PR interval * ms QRS duration 108 ms QT/QTcB 248/388 ms P-R-T axes 118 -60 4 Atrial flutter with 2:1 A-V conduction Left axis deviation Pulmonary disease pattern Abnormal ECG When compared with ECG of 08-Aug-2023 13:30, PREVIOUS ECG IS PRESENT  Echo 08/07/23: 1. Left ventricular ejection fraction, by estimation, is 60 to 65%. The  left ventricle has normal function. The left ventricle has no regional  wall motion abnormalities. There is mild asymmetric left ventricular  hypertrophy of the septal segment. Left  ventricular diastolic parameters were normal.   2. Right ventricular systolic function is normal. The right ventricular  size is severely enlarged. There is normal pulmonary artery systolic  pressure.   3. Right atrial size was mildly dilated.   4. The mitral valve is normal in structure. No evidence of mitral valve  regurgitation. No evidence of mitral stenosis.   5. The aortic valve is tricuspid. Aortic valve regurgitation is not  visualized. No aortic stenosis is present.   Cardiac monitor 06/2023-07/2023: Patch Wear Time:  13 days and 23 hours   HR 49 - 211, average 71 bpm. No atrial fibrillation detected. Occasional supraventricular ectopy, 3.5%. Rare ventricular ectopy. No sustained arrhythmias. No symptom trigger episodes. 41 SVT episodes, all less than 12 seconds     [facility] Cardiac Electrophysiology   ASSESSMENT & PLAN CHA2DS2-VASc Score = 1  The patient's score is based upon: CHF History: 0 HTN History: 1 Diabetes History: 0 Stroke History: 0 Vascular Disease History: 0 Age Score: 0 Gender Score: 0        ASSESSMENT AND PLAN: Paroxysmal Atrial Flutter The patient's CHA2DS2-VASc score is 1, indicating a 0.6% annual risk of stroke.    He is currently in atrial flutter with RVR. He is diaphoretic and feels lightheaded and dizzy at times. Unable to obtain accurate BP in office due to pulse being thready. I discussed with patient that at this time I feel he needs urgent ED evaluation for consideration of emergent cardioversion. The risks appear to be outweighed by the benefit to try to convert him to NSR. I have recommended immediate ED evaluation and patient has agreed. Nurse is taking patient down to the ED via wheelchair.    Patient going to ED.    Lake Bells, PA-C  Afib Clinic [facility] [address] [place], [place] [address] [phone]

## 2023-09-28 SURGERY — TRANSESOPHAGEAL ECHOCARDIOGRAM (TEE) (CATHLAB)
Anesthesia: Monitor Anesthesia Care

## 2023-10-01 ENCOUNTER — Telehealth (HOSPITAL_COMMUNITY): Payer: Self-pay | Admitting: *Deleted

## 2023-10-01 MED ORDER — DILTIAZEM HCL ER COATED BEADS 240 MG PO CP24: 240.0000 mg | ORAL_CAPSULE | Freq: Every day | ORAL | 3 refills | Status: DC

## 2023-10-01 NOTE — Telephone Encounter (Signed)
 Patient wife called in stating he is having side effects from bisoprolol.  Pt states having severe stomach pain and constipation/nausea. Pt misunderstood ER discharge instructions and stopped diltiazem when he started the bisoprolol.  Heart rates have been in the 92-107 range. Discussed with Landry Mellow PA will stop bisoprolol and resume diltiazem 240mg  once a day. Pt wife verbalized understanding.

## 2023-10-03 ENCOUNTER — Institutional Professional Consult (permissible substitution): Payer: Self-pay | Admitting: Neurology

## 2023-10-04 ENCOUNTER — Encounter (HOSPITAL_COMMUNITY): Admitting: Internal Medicine

## 2023-10-06 ENCOUNTER — Inpatient Hospital Stay (HOSPITAL_COMMUNITY)
Admission: EM | Admit: 2023-10-06 | Discharge: 2023-10-10 | DRG: 378 | Disposition: A | Discharge status: Home / Self Care | Attending: Internal Medicine | Admitting: Internal Medicine

## 2023-10-06 ENCOUNTER — Encounter (HOSPITAL_COMMUNITY): Payer: Self-pay | Admitting: Emergency Medicine

## 2023-10-06 ENCOUNTER — Other Ambulatory Visit: Payer: Self-pay

## 2023-10-06 DIAGNOSIS — K589 Irritable bowel syndrome without diarrhea: Secondary | ICD-10-CM | POA: Diagnosis present

## 2023-10-06 DIAGNOSIS — K921 Melena: Principal | ICD-10-CM | POA: Diagnosis present

## 2023-10-06 DIAGNOSIS — Z79899 Other long term (current) drug therapy: Secondary | ICD-10-CM

## 2023-10-06 DIAGNOSIS — G4733 Obstructive sleep apnea (adult) (pediatric): Secondary | ICD-10-CM | POA: Diagnosis present

## 2023-10-06 DIAGNOSIS — K922 Gastrointestinal hemorrhage, unspecified: Secondary | ICD-10-CM | POA: Diagnosis not present

## 2023-10-06 DIAGNOSIS — Z91148 Patient's other noncompliance with medication regimen for other reason: Secondary | ICD-10-CM

## 2023-10-06 DIAGNOSIS — F419 Anxiety disorder, unspecified: Secondary | ICD-10-CM | POA: Diagnosis present

## 2023-10-06 DIAGNOSIS — D72829 Elevated white blood cell count, unspecified: Secondary | ICD-10-CM | POA: Diagnosis present

## 2023-10-06 DIAGNOSIS — D75839 Thrombocytosis, unspecified: Secondary | ICD-10-CM | POA: Diagnosis present

## 2023-10-06 DIAGNOSIS — I4892 Unspecified atrial flutter: Secondary | ICD-10-CM | POA: Diagnosis present

## 2023-10-06 DIAGNOSIS — I119 Hypertensive heart disease without heart failure: Secondary | ICD-10-CM | POA: Diagnosis present

## 2023-10-06 DIAGNOSIS — M170 Bilateral primary osteoarthritis of knee: Secondary | ICD-10-CM | POA: Diagnosis present

## 2023-10-06 DIAGNOSIS — Q438 Other specified congenital malformations of intestine: Secondary | ICD-10-CM

## 2023-10-06 DIAGNOSIS — T45515A Adverse effect of anticoagulants, initial encounter: Secondary | ICD-10-CM | POA: Diagnosis present

## 2023-10-06 DIAGNOSIS — Z888 Allergy status to other drugs, medicaments and biological substances status: Secondary | ICD-10-CM

## 2023-10-06 DIAGNOSIS — R197 Diarrhea, unspecified: Secondary | ICD-10-CM | POA: Diagnosis present

## 2023-10-06 DIAGNOSIS — Z7901 Long term (current) use of anticoagulants: Secondary | ICD-10-CM

## 2023-10-06 DIAGNOSIS — Z885 Allergy status to narcotic agent status: Secondary | ICD-10-CM

## 2023-10-06 DIAGNOSIS — I483 Typical atrial flutter: Principal | ICD-10-CM | POA: Diagnosis present

## 2023-10-06 DIAGNOSIS — Z8711 Personal history of peptic ulcer disease: Secondary | ICD-10-CM

## 2023-10-06 DIAGNOSIS — K219 Gastro-esophageal reflux disease without esophagitis: Secondary | ICD-10-CM | POA: Diagnosis present

## 2023-10-06 DIAGNOSIS — Z9884 Bariatric surgery status: Secondary | ICD-10-CM

## 2023-10-06 DIAGNOSIS — D472 Monoclonal gammopathy: Secondary | ICD-10-CM | POA: Diagnosis present

## 2023-10-06 DIAGNOSIS — Z6841 Body Mass Index (BMI) 40.0 and over, adult: Secondary | ICD-10-CM

## 2023-10-06 DIAGNOSIS — I480 Paroxysmal atrial fibrillation: Secondary | ICD-10-CM | POA: Diagnosis present

## 2023-10-06 DIAGNOSIS — Z91199 Patient's noncompliance with other medical treatment and regimen due to unspecified reason: Secondary | ICD-10-CM

## 2023-10-06 DIAGNOSIS — G8929 Other chronic pain: Secondary | ICD-10-CM | POA: Diagnosis present

## 2023-10-06 DIAGNOSIS — T39315A Adverse effect of propionic acid derivatives, initial encounter: Secondary | ICD-10-CM | POA: Diagnosis present

## 2023-10-06 DIAGNOSIS — R5383 Other fatigue: Secondary | ICD-10-CM | POA: Diagnosis present

## 2023-10-06 DIAGNOSIS — F32A Depression, unspecified: Secondary | ICD-10-CM | POA: Diagnosis present

## 2023-10-06 DIAGNOSIS — I4891 Unspecified atrial fibrillation: Secondary | ICD-10-CM

## 2023-10-06 DIAGNOSIS — D6832 Hemorrhagic disorder due to extrinsic circulating anticoagulants: Secondary | ICD-10-CM | POA: Diagnosis present

## 2023-10-06 DIAGNOSIS — R55 Syncope and collapse: Secondary | ICD-10-CM | POA: Diagnosis present

## 2023-10-06 LAB — CBC
HCT: 44.5 % (ref 39.0–52.0)
HCT: 40.7 % (ref 39.0–52.0)
HCT: 44.4 % (ref 39.0–52.0)
Hemoglobin: 13.9 g/dL (ref 13.0–17.0)
Hemoglobin: 13.1 g/dL (ref 13.0–17.0)
Hemoglobin: 13.7 g/dL (ref 13.0–17.0)
MCH: 28.7 pg (ref 26.0–34.0)
MCH: 29.5 pg (ref 26.0–34.0)
MCH: 29.1 pg (ref 26.0–34.0)
MCHC: 31.2 g/dL (ref 30.0–36.0)
MCHC: 32.2 g/dL (ref 30.0–36.0)
MCHC: 30.9 g/dL (ref 30.0–36.0)
MCV: 91.8 fL (ref 80.0–100.0)
MCV: 91.7 fL (ref 80.0–100.0)
MCV: 94.3 fL (ref 80.0–100.0)
Platelets: 446 10*3/uL — ABNORMAL HIGH (ref 150–400)
Platelets: 377 10*3/uL (ref 150–400)
Platelets: 417 10*3/uL — ABNORMAL HIGH (ref 150–400)
RBC: 4.85 MIL/uL (ref 4.22–5.81)
RBC: 4.44 MIL/uL (ref 4.22–5.81)
RBC: 4.71 MIL/uL (ref 4.22–5.81)
RDW: 14.4 % (ref 11.5–15.5)
RDW: 14.2 % (ref 11.5–15.5)
RDW: 14.3 % (ref 11.5–15.5)
WBC: 17.7 10*3/uL — ABNORMAL HIGH (ref 4.0–10.5)
WBC: 15.1 10*3/uL — ABNORMAL HIGH (ref 4.0–10.5)
WBC: 14.6 10*3/uL — ABNORMAL HIGH (ref 4.0–10.5)
nRBC: 0 % (ref 0.0–0.2)
nRBC: 0 % (ref 0.0–0.2)
nRBC: 0 % (ref 0.0–0.2)

## 2023-10-06 LAB — COMPREHENSIVE METABOLIC PANEL WITH GFR
ALT: 15 U/L (ref 0–44)
AST: 23 U/L (ref 15–41)
Albumin: 3.7 g/dL (ref 3.5–5.0)
Alkaline Phosphatase: 77 U/L (ref 38–126)
Anion gap: 12 (ref 5–15)
BUN: 13 mg/dL (ref 8–23)
CO2: 21 mmol/L — ABNORMAL LOW (ref 22–32)
Calcium: 8.2 mg/dL — ABNORMAL LOW (ref 8.9–10.3)
Chloride: 102 mmol/L (ref 98–111)
Creatinine, Ser: 0.69 mg/dL (ref 0.61–1.24)
GFR, Estimated: >60 mL/min (ref >60)
Glucose, Bld: 172 mg/dL — ABNORMAL HIGH (ref 70–99)
Potassium: 4 mmol/L (ref 3.5–5.1)
Sodium: 135 mmol/L (ref 135–145)
Total Bilirubin: 0.6 mg/dL (ref 0.0–1.2)
Total Protein: 7.2 g/dL (ref 6.5–8.1)

## 2023-10-06 LAB — PROTIME-INR
INR: 1.2 (ref 0.8–1.2)
Prothrombin Time: 15.2 s (ref 11.4–15.2)

## 2023-10-06 LAB — TYPE AND SCREEN
ABO/RH(D): O NEG
Antibody Screen: NEGATIVE

## 2023-10-06 LAB — TSH: TSH: 3.02 u[IU]/mL (ref 0.350–4.500)

## 2023-10-06 LAB — POC OCCULT BLOOD, ED: Fecal Occult Bld: POSITIVE — AB

## 2023-10-06 LAB — TROPONIN I (HIGH SENSITIVITY): Troponin I (High Sensitivity): 4 ng/L (ref <18)

## 2023-10-06 LAB — MRSA NEXT GEN BY PCR, NASAL: MRSA by PCR Next Gen: NOT DETECTED

## 2023-10-06 LAB — ABO/RH: ABO/RH(D): O NEG

## 2023-10-06 LAB — HIV ANTIBODY (ROUTINE TESTING W REFLEX): HIV Screen 4th Generation wRfx: NONREACTIVE

## 2023-10-06 MED ORDER — METOPROLOL TARTRATE 25 MG PO TABS
75.0000 mg | ORAL_TABLET | Freq: Two times a day (BID) | ORAL | Status: DC
  Filled 2023-10-06: qty 3

## 2023-10-06 MED ORDER — DILTIAZEM LOAD VIA INFUSION
20.0000 mg | Freq: Once | INTRAVENOUS | Status: AC
  Administered 2023-10-06: 20 mg via INTRAVENOUS
  Filled 2023-10-06: qty 20

## 2023-10-06 MED ORDER — PANTOPRAZOLE SODIUM 40 MG IV SOLR
40.0000 mg | Freq: Two times a day (BID) | INTRAVENOUS | Status: DC
  Administered 2023-10-06 – 2023-10-10 (×9): 40 mg via INTRAVENOUS
  Filled 2023-10-06 (×9): qty 10

## 2023-10-06 MED ORDER — METOPROLOL TARTRATE 5 MG/5ML IV SOLN: 5.0000 mg | INTRAVENOUS | Status: DC | PRN

## 2023-10-06 MED ORDER — ONDANSETRON HCL 4 MG/2ML IJ SOLN: 4.0000 mg | Freq: Four times a day (QID) | INTRAMUSCULAR | Status: DC | PRN

## 2023-10-06 MED ORDER — CITALOPRAM HYDROBROMIDE 20 MG PO TABS
20.0000 mg | ORAL_TABLET | Freq: Two times a day (BID) | ORAL | Status: DC
Start: 2023-10-06 — End: 2023-10-10
  Administered 2023-10-06 – 2023-10-10 (×9): 20 mg via ORAL
  Filled 2023-10-06 (×5): qty 1
  Filled 2023-10-06: qty 2
  Filled 2023-10-06 (×4): qty 1

## 2023-10-06 MED ORDER — TRAZODONE HCL 50 MG PO TABS
50.0000 mg | ORAL_TABLET | Freq: Every evening | ORAL | Status: DC | PRN
Start: 2023-10-06 — End: 2023-10-10

## 2023-10-06 MED ORDER — DILTIAZEM HCL 30 MG PO TABS
30.0000 mg | ORAL_TABLET | Freq: Four times a day (QID) | ORAL | Status: DC
  Administered 2023-10-06 – 2023-10-07 (×4): 30 mg via ORAL
  Filled 2023-10-06 (×4): qty 1

## 2023-10-06 MED ORDER — DILTIAZEM HCL 25 MG/5ML IV SOLN: 5.0000 mg | INTRAVENOUS | Status: DC | PRN

## 2023-10-06 MED ORDER — DILTIAZEM HCL ER COATED BEADS 120 MG PO CP24: 240.0000 mg | ORAL_CAPSULE | Freq: Every day | ORAL | Status: DC

## 2023-10-06 MED ORDER — DIGOXIN 0.25 MG/ML IJ SOLN
0.5000 mg | Freq: Once | INTRAMUSCULAR | Status: AC
  Administered 2023-10-06: 0.5 mg via INTRAVENOUS
  Filled 2023-10-06: qty 2

## 2023-10-06 MED ORDER — HYDRALAZINE HCL 20 MG/ML IJ SOLN
10.0000 mg | INTRAMUSCULAR | Status: DC | PRN
  Administered 2023-10-07: 10 mg via INTRAVENOUS
  Filled 2023-10-06: qty 1

## 2023-10-06 MED ORDER — ACETAMINOPHEN 325 MG PO TABS: 650.0000 mg | ORAL_TABLET | Freq: Four times a day (QID) | ORAL | Status: DC | PRN

## 2023-10-06 MED ORDER — CHLORHEXIDINE GLUCONATE CLOTH 2 % EX PADS
6.0000 | MEDICATED_PAD | Freq: Every day | CUTANEOUS | Status: DC
  Administered 2023-10-06 – 2023-10-10 (×5): 6 via TOPICAL

## 2023-10-06 MED ORDER — IPRATROPIUM-ALBUTEROL 0.5-2.5 (3) MG/3ML IN SOLN: 3.0000 mL | RESPIRATORY_TRACT | Status: DC | PRN

## 2023-10-06 MED ORDER — GLUCAGON HCL RDNA (DIAGNOSTIC) 1 MG IJ SOLR: 1.0000 mg | INTRAMUSCULAR | Status: DC | PRN

## 2023-10-06 MED ORDER — DILTIAZEM HCL-DEXTROSE 125-5 MG/125ML-% IV SOLN (PREMIX)
5.0000 mg/h | INTRAVENOUS | Status: DC
  Administered 2023-10-06: 5 mg/h via INTRAVENOUS
  Administered 2023-10-06: 17.5 mg/h via INTRAVENOUS
  Administered 2023-10-07: 5 mg/h via INTRAVENOUS
  Administered 2023-10-07: 17.5 mg/h via INTRAVENOUS
  Filled 2023-10-06 (×5): qty 125

## 2023-10-06 NOTE — ED Provider Notes (Signed)
 [place] EMERGENCY DEPARTMENT AT [facility] Provider Note   CSN: [id number] Arrival date & time: 10/06/23  0230     History  Chief Complaint  Patient presents with   Rectal Bleeding   Near Syncope    Cody Hampton is a 63 y.o. male.  The history is provided by the patient and the spouse.  Rectal Bleeding Near Syncope  He has history of hypertension, atrial flutter anticoagulated on apixaban and comes in because of his heart rate has been high and he has been having abdominal pain and dark stools.  Stools have been darker than normal for about the last 3 days, and he has had some vague epigastric discomfort.  He has taken some antacids without any relief.  Heart rate per pulse oximeter has been as high as 154 tonight.  He contacted his cardiologist who recommended he come to the emergency department.  He denies feeling dizzy or lightheaded.  He denies dyspnea.  He denies chest pain, heaviness, tightness, pressure.   Home Medications Prior to Admission medications   Medication Sig Start Date End Date Taking? Authorizing Provider  apixaban (ELIQUIS) 5 MG TABS tablet Take 1 tablet (5 mg total) by mouth 2 (two) times daily. 09/26/23   Eustace Pen, PA-C  citalopram (CELEXA) 20 MG tablet Take 1 tablet by mouth 2 (two) times daily. 06/28/23   [provider]  diltiazem (CARDIZEM CD) 240 MG 24 hr capsule Take 1 capsule (240 mg total) by mouth daily. 10/01/23   Eustace Pen, PA-C  loperamide (IMODIUM A-D) 2 MG tablet Take 2 mg by mouth daily. Will take additional if needed    [provider]  Multiple Vitamins-Minerals (BARIATRIC MULTIVITAMINS/IRON) CAPS Take 1 capsule by mouth daily with breakfast.    [provider]  oxyCODONE-acetaminophen (PERCOCET/ROXICET) 5-325 MG tablet Take 1 tablet by mouth as needed. Take 1 tablet by mouth in the morning and an additional 1 tablet once a day as needed for pain    [provider]  pantoprazole  (PROTONIX) 40 MG tablet Take 40 mg by mouth as needed (for reflux or GERD symptoms).    [provider]  polycarbophil (FIBERCON) 625 MG tablet Take 625 mg by mouth daily.    [provider]  testosterone cypionate (DEPOTESTOSTERONE CYPIONATE) 200 MG/ML injection Inject into the muscle every 14 (fourteen) days. 09/14/23   [provider]      Allergies    Duloxetine, Gabapentin, and Hydrocodone    Review of Systems   Review of Systems  Cardiovascular:  Positive for near-syncope.  Gastrointestinal:  Positive for hematochezia.  All other systems reviewed and are negative.   Physical Exam Updated Vital Signs BP 133/85   Pulse (!) 143   Temp 98.7 F (37.1 C) (Oral)   Resp 16   Ht 5\' 10"  (1.778 m)   Wt (!) 190 kg   SpO2 94%   BMI 60.10 kg/m  Physical Exam Vitals and nursing note reviewed.   63 year old male, resting comfortably and in no acute distress. Vital signs are significant for rapid heart rate and respiratory rate and elevated blood pressure. Oxygen saturation is 94%, which is normal. Head is normocephalic and atraumatic. PERRLA, EOMI. Oropharynx is clear.  Conjunctivae are pink. Lungs are clear without rales, wheezes, or rhonchi. Chest is nontender.  Gynecomastia noted Heart is tachycardic without murmur. Abdomen is soft, flat, nontender. Rectal: Normal sphincter tone, small amount of dark brown stool which is strongly Hemoccult  positive. Extremities have no cyanosis or edema. Skin is warm and dry without rash. Neurologic: Mental status is normal, moves all extremities equally.  ED Results / Procedures / Treatments   Labs (all labs ordered are listed, but only abnormal results are displayed) Labs Reviewed  COMPREHENSIVE METABOLIC PANEL WITH GFR - Abnormal; Notable for the following components:      Result Value   CO2 21 (*)    Glucose, Bld 172 (*)    Calcium 8.2 (*)    All other components within normal limits  CBC - Abnormal; Notable  for the following components:   WBC 17.7 (*)    Platelets 446 (*)    All other components within normal limits  PROTIME-INR  POC OCCULT BLOOD, ED  TYPE AND SCREEN  TROPONIN I (HIGH SENSITIVITY)    EKG EKG Interpretation Date/Time:  Saturday October 06 2023 02:49:08 EDT Ventricular Rate:  146 PR Interval:  102 QRS Duration:  99 QT Interval:  348 QTC Calculation: 543 R Axis:   -73  Text Interpretation: Atrial flutter Inferior infarct, old Probable anterior infarct, age indeterminate Prolonged QT interval Baseline wander in lead(s) III When compared with ECG of 3/20, No significant change was found Reconfirmed by Dione Booze ([id number]) on 10/06/2023 4:12:40 AM  Radiology No results found.  Procedures Procedures  Cardiac monitor shows atrial flutter, per my interpretation  Medications Ordered in ED Medications  diltiazem (CARDIZEM) 1 mg/mL load via infusion 20 mg (20 mg Intravenous Bolus from Bag 10/06/23 0408)    And  diltiazem (CARDIZEM) 125 mg in dextrose 5% 125 mL (1 mg/mL) infusion (5 mg/hr Intravenous New Bag/Given 10/06/23 0408)    ED Course/ Medical Decision Making/ A&P                                 Medical Decision Making Amount and/or Complexity of Data Reviewed Labs: ordered.  Risk Prescription drug management.   GI bleeding and patient was anticoagulated, atrial flutter with rapid ventricular response.  I have reviewed his electrocardiogram, and my interpretation is atrial flutter, unchanged from prior ECG.  I have reviewed his laboratory tests, and my interpretation is hemoglobin still in the normal range but with slight drop compared with 09/27/2023, leukocytosis and thrombocytosis which are not significantly changed from baseline, elevated random glucose level, normal BUN and creatinine.  This does not appear to be a life-threatening bleed, so I do not feel that he needs rapid reversal of anticoagulation.  Last dose of apixaban was approximately 11 PM.  I have  ordered diltiazem bolus and infusion to try to get better rate control.  I have reviewed his past records, and note ED visit on 09/27/2023 for atrial flutter manage with diltiazem and bisoprolol.  He will need to be admitted for monitoring hemoglobin and will probably need upper endoscopy.  I have discussed case with Dr. Toniann Fail of [facility], who agrees to admit the patient.  CRITICAL CARE Performed by: Dione Booze Total critical care time: 45 minutes Critical care time was exclusive of separately billable procedures and treating other patients. Critical care was necessary to treat or prevent imminent or life-threatening deterioration. Critical care was time spent personally by me on the following activities: development of treatment plan with patient and/or surrogate as well as nursing, discussions with consultants, evaluation of patient's response to treatment, examination of patient, obtaining history from patient or surrogate, ordering and performing treatments and interventions, ordering  and review of laboratory studies, ordering and review of radiographic studies, pulse oximetry and re-evaluation of patient's condition.  Final Clinical Impression(s) / ED Diagnoses Final diagnoses:  Gastrointestinal hemorrhage, unspecified gastrointestinal hemorrhage type  Atrial flutter with rapid ventricular response (HCC)  Chronic anticoagulation  Thrombocytosis    Rx / DC Orders ED Discharge Orders     None         Dione Booze, MD 10/06/23 [phone]

## 2023-10-06 NOTE — ED Triage Notes (Signed)
 Pt to ED from home c/o black tarry stool since yesterday, abd pain and weakness as well.  Pain radiating down left arm.  Recently treated for A flutter and started new medication, besaprolol and on eliquis.

## 2023-10-06 NOTE — ED Notes (Signed)
 ED TO INPATIENT HANDOFF REPORT  Name/Age/Gender Cody Hampton 63 y.o. male  Code Status    Code Status Orders  (From admission, onward)           Start     Ordered   10/06/23 0544  Full code  Continuous       Question:  By:  Answer:  Consent: discussion documented in EHR   10/06/23 0544           Code Status History     This patient has a current code status but no historical code status.       Home/SNF/Other Home  Chief Complaint Atrial flutter (HCC) [I48.92]  Level of Care/Admitting Diagnosis ED Disposition     ED Disposition  Admit   Condition  --   Comment  Hospital Area: [facility] [[id number]]  Level of Care: Stepdown [14]  Admit to SDU based on following criteria: Cardiac Instability:  Patients experiencing chest pain, unconfirmed MI and stable, arrhythmias and CHF requiring medical management and potentially compromising patient's stability  [facility] patient in [facility] at [facility] or [facility] if equivalent level of care is available:: Yes  Covid Evaluation: Asymptomatic - no recent exposure (last 10 days) testing not required  Diagnosis: Atrial flutter (HCC) [427.32.ICD-9-CM]  Admitting Physician: Eduard Clos [[id number]]  Attending Physician: Eduard Clos [phone]          Medical History Past Medical History:  Diagnosis Date   Arthritis    in knees   Depression    Hypertension    Obesity    Severe sleep apnea     Allergies Allergies  Allergen Reactions   Metoprolol Anxiety, Hypertension, Nausea Only, Other (See Comments), Photosensitivity and Shortness Of Breath   Duloxetine Other (See Comments)    Made mood worse.   Gabapentin Other (See Comments)    "Not tolerated well."   Hydrocodone Other (See Comments)    "Made me feel odd in my head."    IV Location/Drains/Wounds Patient Lines/Drains/Airways Status     Active Line/Drains/Airways     Name Placement date Placement time Site Days    Peripheral IV 10/06/23 20 G 1" Anterior;Distal;Left;Upper Arm 10/06/23  0309  Arm  less than 1            Labs/Imaging Results for orders placed or performed during the hospital encounter of 10/06/23 (from the past 48 hours)  Comprehensive metabolic panel     Status: Abnormal   Collection Time: 10/06/23  3:05 AM  Result Value Ref Range   Sodium 135 135 - 145 mmol/L   Potassium 4.0 3.5 - 5.1 mmol/L   Chloride 102 98 - 111 mmol/L   CO2 21 (L) 22 - 32 mmol/L   Glucose, Bld 172 (H) 70 - 99 mg/dL    Comment: Glucose reference range applies only to samples taken after fasting for at least 8 hours.   BUN 13 8 - 23 mg/dL   Creatinine, Ser 6.21 0.61 - 1.24 mg/dL   Calcium 8.2 (L) 8.9 - 10.3 mg/dL   Total Protein 7.2 6.5 - 8.1 g/dL   Albumin 3.7 3.5 - 5.0 g/dL   AST 23 15 - 41 U/L   ALT 15 0 - 44 U/L   Alkaline Phosphatase 77 38 - 126 U/L   Total Bilirubin 0.6 0.0 - 1.2 mg/dL   GFR, Estimated >30 >86 mL/min    Comment: (NOTE) Calculated using the CKD-EPI Creatinine Equation (2021)  Anion gap 12 5 - 15    Comment: Performed at [facility], [address] [place], [place] [address]  CBC     Status: Abnormal   Collection Time: 10/06/23  3:05 AM  Result Value Ref Range   WBC 17.7 (H) 4.0 - 10.5 K/uL   RBC 4.85 4.22 - 5.81 MIL/uL   Hemoglobin 13.9 13.0 - 17.0 g/dL   HCT 91.4 78.2 - 95.6 %   MCV 91.8 80.0 - 100.0 fL   MCH 28.7 26.0 - 34.0 pg   MCHC 31.2 30.0 - 36.0 g/dL   RDW 21.3 08.6 - 57.8 %   Platelets 446 (H) 150 - 400 K/uL   nRBC 0.0 0.0 - 0.2 %    Comment: Performed at [facility], [address], [place], [place] [address]  Type and screen [facility]     Status: None   Collection Time: 10/06/23  3:05 AM  Result Value Ref Range   ABO/RH(D) O NEG    Antibody Screen NEG    Sample Expiration      10/09/2023,2359 Performed at [facility], [address], [place], [place] [address]    Protime-INR - (order if Patient is taking Coumadin / Warfarin)     Status: None   Collection Time: 10/06/23  3:05 AM  Result Value Ref Range   Prothrombin Time 15.2 11.4 - 15.2 seconds   INR 1.2 0.8 - 1.2    Comment: (NOTE) INR goal varies based on device and disease states. Performed at [facility], [address] [place], [place] [address]   Troponin I (High Sensitivity)     Status: None   Collection Time: 10/06/23  3:05 AM  Result Value Ref Range   Troponin I (High Sensitivity) 4 <18 ng/L    Comment: (NOTE) Elevated high sensitivity troponin I (hsTnI) values and significant  changes across serial measurements may suggest ACS but many other  chronic and acute conditions are known to elevate hsTnI results.  Refer to the "Links" section for chest pain algorithms and additional  guidance. Performed at [facility], [address] [place], [place] [address]   POC occult blood, ED     Status: Abnormal   Collection Time: 10/06/23  4:45 AM  Result Value Ref Range   Fecal Occult Bld POSITIVE (A) NEGATIVE  CBC     Status: Abnormal   Collection Time: 10/06/23  5:43 AM  Result Value Ref Range   WBC 15.1 (H) 4.0 - 10.5 K/uL   RBC 4.44 4.22 - 5.81 MIL/uL   Hemoglobin 13.1 13.0 - 17.0 g/dL   HCT 25.3 66.4 - 40.3 %   MCV 91.7 80.0 - 100.0 fL   MCH 29.5 26.0 - 34.0 pg   MCHC 32.2 30.0 - 36.0 g/dL   RDW 47.4 25.9 - 56.3 %   Platelets 377 150 - 400 K/uL   nRBC 0.0 0.0 - 0.2 %    Comment: Performed at [facility], [address] [place], [place] [address]  TSH     Status: None   Collection Time: 10/06/23  5:45 AM  Result Value Ref Range   TSH 3.020 0.350 - 4.500 uIU/mL    Comment: Performed by a 3rd Generation assay with a functional sensitivity of <=0.01 uIU/mL. Performed at [facility], [address], [place], [place] [address]    No results found.  Pending Labs [employer] (From  admission, onward)      Start     Ordered   10/07/23 0500  Basic metabolic panel with GFR  Daily,   R      10/06/23 0826   10/07/23 0500  CBC  Daily,   R      10/06/23 0826   10/07/23 0500  Magnesium  Daily,   R      10/06/23 0826   10/07/23 0500  Phosphorus  Tomorrow morning,   R        10/06/23 0826   10/06/23 0930  ABO/Rh  Once,   R        10/06/23 0930   10/06/23 0544  CBC  Now then every 4 hours,   R      10/06/23 0544   10/06/23 0543  HIV Antibody (routine testing w rflx)  (HIV Antibody (Routine testing w reflex) panel)  Once,   R        10/06/23 0544            Vitals/Pain Today's Vitals   10/06/23 0628 10/06/23 0630 10/06/23 0810 10/06/23 1027  BP:   123/69   Pulse:  (!) 118 (!) 117   Resp:  18 19   Temp: 98.1 F (36.7 C)   98.5 F (36.9 C)  TempSrc: Oral   Oral  SpO2:  93% 93%   Weight:      Height:      PainSc:        Isolation Precautions No active isolations  Medications Medications  diltiazem (CARDIZEM) 1 mg/mL load via infusion 20 mg (20 mg Intravenous Bolus from Bag 10/06/23 0408)    And  diltiazem (CARDIZEM) 125 mg in dextrose 5% 125 mL (1 mg/mL) infusion (15 mg/hr Intravenous Rate/Dose Change 10/06/23 0517)  citalopram (CELEXA) tablet 20 mg (20 mg Oral Given 10/06/23 1004)  pantoprazole (PROTONIX) injection 40 mg (40 mg Intravenous Given 10/06/23 0605)  ipratropium-albuterol (DUONEB) 0.5-2.5 (3) MG/3ML nebulizer solution 3 mL (has no administration in time range)  ondansetron (ZOFRAN) injection 4 mg (has no administration in time range)  acetaminophen (TYLENOL) tablet 650 mg (has no administration in time range)  traZODone (DESYREL) tablet 50 mg (has no administration in time range)  metoprolol tartrate (LOPRESSOR) injection 5 mg (has no administration in time range)  hydrALAZINE (APRESOLINE) injection 10 mg (has no administration in time range)  glucagon (human recombinant) (GLUCAGEN) injection 1 mg (has no administration in time range)  diltiazem (CARDIZEM) tablet 30 mg  (has no administration in time range)    Mobility walks

## 2023-10-06 NOTE — H&P (Signed)
 History and Physical    Cody Hampton ZOX:[id number] DOB: 1960-12-08 DOA: 10/06/2023  Patient coming from: Home.  Chief Complaint: Dark tarry stools.  HPI: Cody Hampton is a 63 y.o. male with history of A-fib recently started on Eliquis about 10 days ago, sleep apnea, gastric bypass surgery, MGUS presents to the ER after patient had at least 3 episode of dark tarry stools.  Denies any abdominal pain nausea or vomiting.  Patient states he takes naproxen every day for chronic pain.  During last ER visit patient was seen by cardiology and felt that patient was not a good candidate for ablation given the morbid obesity and also since patient was not on anticoagulation cardioversion was not attempted.  Patient states he has had colonoscopy and endoscopy in 2021 for symptoms of bloating and diarrhea.  Results of which are not able to obtain from the Care Everywhere.  ED Course: In the ER patient was found to be in atrial flutter with RVR and was started on Cardizem infusion.  Hemoglobin is 13.9 slight decrease from 14.6 about 10 days ago.  Stool for occult blood is positive.  Patient admitted for GI bleed with atrial flutter with RVR.  Review of Systems: As per HPI, rest all negative.   Past Medical History:  Diagnosis Date   Arthritis    in knees   Depression    Hypertension    Obesity    Severe sleep apnea     History reviewed. No pertinent surgical history.   reports that he has never smoked. He has never used smokeless tobacco. He reports that he does not currently use alcohol. He reports that he does not use drugs.  Allergies  Allergen Reactions   Duloxetine Other (See Comments)    Made mood worse.   Gabapentin Other (See Comments)    "Not tolerated well."   Hydrocodone Other (See Comments)    "Made me feel odd in my head."    History reviewed. No pertinent family history.  Prior to Admission medications   Medication Sig Start Date End Date Taking? Authorizing Provider   apixaban (ELIQUIS) 5 MG TABS tablet Take 1 tablet (5 mg total) by mouth 2 (two) times daily. 09/26/23   Eustace Pen, PA-C  citalopram (CELEXA) 20 MG tablet Take 1 tablet by mouth 2 (two) times daily. 06/28/23   [provider]  diltiazem (CARDIZEM CD) 240 MG 24 hr capsule Take 1 capsule (240 mg total) by mouth daily. 10/01/23   Eustace Pen, PA-C  loperamide (IMODIUM A-D) 2 MG tablet Take 2 mg by mouth daily. Will take additional if needed    [provider]  Multiple Vitamins-Minerals (BARIATRIC MULTIVITAMINS/IRON) CAPS Take 1 capsule by mouth daily with breakfast.    [provider]  oxyCODONE-acetaminophen (PERCOCET/ROXICET) 5-325 MG tablet Take 1 tablet by mouth as needed. Take 1 tablet by mouth in the morning and an additional 1 tablet once a day as needed for pain    [provider]  pantoprazole (PROTONIX) 40 MG tablet Take 40 mg by mouth as needed (for reflux or GERD symptoms).    [provider]  polycarbophil (FIBERCON) 625 MG tablet Take 625 mg by mouth daily.    [provider]  testosterone cypionate (DEPOTESTOSTERONE CYPIONATE) 200 MG/ML injection Inject into the muscle every 14 (fourteen) days. 09/14/23   [provider]    Physical Exam: Constitutional: Moderately built and nourished. Vitals:   10/06/23 8119 10/06/23 0500 10/06/23 0515 10/06/23 0530  BP: (!) 131/93 (!) 153/83 (!) 164/91 (!) 150/100  Pulse: 71 (!) 120 (!) 120 (!) 122  Resp: 19 17 (!) 22 19  Temp:      TempSrc:      SpO2: 95% 93% 95% 94%  Weight:      Height:       Eyes: Anicteric no pallor. ENMT: No discharge from the ears eyes nose or mouth. Neck: No mass felt.  Nonoperative. Respiratory: No rhonchi or crepitations. Cardiovascular: S1 S2 heard. Abdomen: Soft nontender bowel sound present. Musculoskeletal: No edema. Skin: No rash. Neurologic: Alert awake oriented to time place and person.  Moves all extremities. Psychiatric: Appears  normal.  Normal affect.   Labs on Admission: I have personally reviewed following labs and imaging studies  CBC: Recent Labs  Lab 10/06/23 0305  WBC 17.7*  HGB 13.9  HCT 44.5  MCV 91.8  PLT 446*   Basic Metabolic Panel: Recent Labs  Lab 10/06/23 0305  NA 135  K 4.0  CL 102  CO2 21*  GLUCOSE 172*  BUN 13  CREATININE 0.69  CALCIUM 8.2*   GFR: Estimated Creatinine Clearance: 158.1 mL/min (by C-G formula based on SCr of 0.69 mg/dL). Liver Function Tests: Recent Labs  Lab 10/06/23 0305  AST 23  ALT 15  ALKPHOS 77  BILITOT 0.6  PROT 7.2  ALBUMIN 3.7   No results for input(s): "LIPASE", "AMYLASE" in the last 168 hours. No results for input(s): "AMMONIA" in the last 168 hours. Coagulation Profile: Recent Labs  Lab 10/06/23 0305  INR 1.2   Cardiac Enzymes: No results for input(s): "CKTOTAL", "CKMB", "CKMBINDEX", "TROPONINI" in the last 168 hours. BNP (last 3 results) No results for input(s): "PROBNP" in the last 8760 hours. HbA1C: No results for input(s): "HGBA1C" in the last 72 hours. CBG: No results for input(s): "GLUCAP" in the last 168 hours. Lipid Profile: No results for input(s): "CHOL", "HDL", "LDLCALC", "TRIG", "CHOLHDL", "LDLDIRECT" in the last 72 hours. Thyroid Function Tests: No results for input(s): "TSH", "T4TOTAL", "FREET4", "T3FREE", "THYROIDAB" in the last 72 hours. Anemia Panel: No results for input(s): "VITAMINB12", "FOLATE", "FERRITIN", "TIBC", "IRON", "RETICCTPCT" in the last 72 hours. Urine analysis: No results found for: "COLORURINE", "APPEARANCEUR", "LABSPEC", "PHURINE", "GLUCOSEU", "HGBUR", "BILIRUBINUR", "KETONESUR", "PROTEINUR", "UROBILINOGEN", "NITRITE", "LEUKOCYTESUR" Sepsis Labs: @LABRCNTIP (procalcitonin:4,lacticidven:4) )No results found for this or any previous visit (from the past 240 hours).   Radiological Exams on Admission: No results found.  EKG: Independently reviewed.  Atrial flutter with  RVR.  Assessment/Plan Principal Problem:   Atrial flutter with rapid ventricular response (HCC) Active Problems:   Anxiety and depression   OSA (obstructive sleep apnea)   S/P bariatric surgery   Atrial flutter (HCC)   GI bleeding    Acute GI bleed in the setting of Eliquis last dose was last night at 11 PM.  Will hold Eliquis and check serial CBC.  Given that patient is using naproxen likely cause for the GI bleed.  Protonix IV.  Patient has had colonoscopy and EGD in 2021 for symptoms of bloating and diarrhea.  Results from Care Everywhere not able to be obtained.  [facility] consulted. Atrial flutter with RVR presently on Cardizem infusion.  Eliquis on hold due to GI bleed.  Last dose was last night around 9 PM.  Cardiology has been consulted.  Check TSH. Anxiety and depression on Celexa. Sleep apnea on CPAP at bedtime. Chronic pain takes naproxen every day and as needed oxycodone. MGUS being followed by oncologist. Status post bariatric  surgery takes B12 supplements.  Since patient has GI bleed in the setting of health close with a flutter with RVR will need close monitoring and more than 2 midnight stay.   DVT prophylaxis: SCDs. Code Status: Full code. Family Communication: Family at the bedside. Disposition Plan: Stepdown. Consults called: Cardiology and gastroenterologist. Admission status: Observation.

## 2023-10-06 NOTE — ED Notes (Signed)
 ED Provider at bedside.

## 2023-10-06 NOTE — Hospital Course (Addendum)
 Brief Narrative:   63 year old history of A-fib on Eliquis started about 10 days ago, sleep apnea, gastric bypass, MGUS comes to the ER with 3 days of dark tarry stools.  In the ER noted to be in A-fib with RVR started on Cardizem drip, hemoglobin 13.9, Hemoccult positive.  GI following, planning on endoscopy after Eliquis washout.  For uncontrolled atrial fibrillation with RVR initially patient was started on Cardizem drip and slowly p.o. Cardizem and digoxin was introduced.  Assessment & Plan:  Principal Problem:   Atrial flutter with rapid ventricular response (HCC) Active Problems:   Anxiety and depression   MGUS (monoclonal gammopathy of unknown significance)   OSA (obstructive sleep apnea)   S/P bariatric surgery   Atrial flutter (HCC)   GI bleeding   Acute GI bleed - Suspect in the setting of starting Eliquis recently for atrial fibrillation.  At [facility] EGD 2021 per patient was in 2021-normal.  Colonoscopy 2021 incomplete due to redundant colon. -Baseline hemoglobin 14, remained stable.  GI following, continue PPI twice daily. - Eliquis washout thereafter endoscopy, timing per GI  Atrial Flutter/fib with RVR Essential hypertension - Heart rate is better controlled now on p.o. Cardizem and digoxin.  Still remains in a flutter but for now acceptable as long as his rate is controlled.  Further plans for any sort of cardioversion will be deferred to cardiology team - TSH-normal - Echo January 2025-preserved EF, enlarged RV - IV as needed  Leukocytosis - Looking at previous trend appears to be chronic.  No signs of infection  Anxiety/depression - Celexa  Sleep apnea - Bedtime CPAP if necessary  Chronic pain - As needed meds  MGUS - Followed by outpatient oncology  Status post bariatric surgery - On B12 supplements   DVT prophylaxis: SCDs Start: 10/06/23 0544    Code Status: Full Code Family Communication: Son at bedside Controlling A-fib and ongoing evaluation for  GI bleed    Subjective: Seen at bedside no complaints.  Sitting up in the chair.  Heart rate appears to be much better today.   Examination:  General exam: Appears calm and comfortable, morbid obesity Respiratory system: Clear to auscultation. Respiratory effort normal. Cardiovascular system: Tachycardia, irregularly irregular Gastrointestinal system: Abdomen is nondistended, soft and nontender. No organomegaly or masses felt. Normal bowel sounds heard. Central nervous system: Alert and oriented. No focal neurological deficits. Extremities: Symmetric 5 x 5 power. Skin: No rashes, lesions or ulcers Psychiatry: Judgement and insight appear normal. Mood & affect appropriate.

## 2023-10-06 NOTE — Plan of Care (Signed)
  Problem: Education: Goal: Knowledge of General Education information will improve Description: Including pain rating scale, medication(s)/side effects and non-pharmacologic comfort measures Outcome: Progressing   Problem: Nutrition: Goal: Adequate nutrition will be maintained Outcome: Progressing   Problem: Coping: Goal: Level of anxiety will decrease Outcome: Progressing   Problem: Activity: Goal: Risk for activity intolerance will decrease Outcome: Not Progressing   

## 2023-10-06 NOTE — Consult Note (Addendum)
 UNASSIGNED PATIENT Reason for Consult: Rectal bleeding. Referring Physician: [facility] hospitalist. Primary gastroenterologist-Dr.Nyree Jacinto Reap, [facility] Cody Hampton is an 63 y.o. male.  HPI: Cody Hampton is a 63 year old white male with multiple medical problems listed below; he receives his GI care at [facility] to Dr. Gibson Ramp and according to care everywhere he had an EGD in 2021 that was unrevealing; he also had a colonoscopy that was aborted to the hepatic flexure due to redundant colon but was noted to have diverticulosis in the rest of the colon. Patient was started on Eliquis 10 days ago for atrial flutter and has been having dark melenic stools for the last 3 days. He has had some epigastric pain he denies any history of frank hematochezia.  There is no history of being lightheaded. Patient denies a history of syncope did have some near syncope. The ER he was noted to be in atrial flutter flutter with rapid ventricular response was started on a Cardizem drip there was slight drop in his hemoglobin from 14.6 g/dL to 21.3 g/dL and stools were positive for occult blood. He hemoglobin this morning is 13.1 g/dL. He denies a previous history of peptic ulcer disease. He is on Protonix for GERD  Past Medical History:  Diagnosis Date   Arthritis    Chronic back pain   in knees   MGUS    Diarrhea predominant irritable bowel syndrome    Colonic diverticulosis    Depression    Hypertension    Morbid obesity-BMI 60    Severe obstructive sleep apnea    History reviewed.  Bariatric surgery-gastric bypass-in 2019 .  History reviewed. No pertinent family history.  Social History:  reports that he has never smoked. He has never used smokeless tobacco. He reports that he does not currently use alcohol. He reports that he does not use drugs.  Allergies:  Allergies  Allergen Reactions   Duloxetine Other (See Comments)    Made mood worse.   Gabapentin Other (See Comments)    "Not  tolerated well."   Hydrocodone Other (See Comments)    "Made me feel odd in my head."   Medications: I have reviewed the patient's current medications. Prior to Admission: (Not in a hospital admission)  Scheduled:  citalopram  20 mg Oral BID   metoprolol tartrate  75 mg Oral BID   pantoprazole (PROTONIX) IV  40 mg Intravenous Q12H   Continuous:  diltiazem (CARDIZEM) infusion 15 mg/hr (10/06/23 0517)   YQM:VHQIONGEXBMWU, glucagon (human recombinant), hydrALAZINE, ipratropium-albuterol, metoprolol tartrate, ondansetron (ZOFRAN) IV, traZODone  Results for orders placed or performed during the hospital encounter of 10/06/23 (from the past 48 hours)  Comprehensive metabolic panel     Status: Abnormal   Collection Time: 10/06/23  3:05 AM  Result Value Ref Range   Sodium 135 135 - 145 mmol/L   Potassium 4.0 3.5 - 5.1 mmol/L   Chloride 102 98 - 111 mmol/L   CO2 21 (L) 22 - 32 mmol/L   Glucose, Bld 172 (H) 70 - 99 mg/dL    Comment: Glucose reference range applies only to samples taken after fasting for at least 8 hours.   BUN 13 8 - 23 mg/dL   Creatinine, Ser 1.32 0.61 - 1.24 mg/dL   Calcium 8.2 (L) 8.9 - 10.3 mg/dL   Total Protein 7.2 6.5 - 8.1 g/dL   Albumin 3.7 3.5 - 5.0 g/dL   AST 23 15 - 41 U/L   ALT 15 0 - 44 U/L  Alkaline Phosphatase 77 38 - 126 U/L   Total Bilirubin 0.6 0.0 - 1.2 mg/dL   GFR, Estimated >91 >47 mL/min    Comment: (NOTE) Calculated using the CKD-EPI Creatinine Equation (2021)    Anion gap 12 5 - 15    Comment: Performed at [facility], [address], [place], [place] [address]  CBC     Status: Abnormal   Collection Time: 10/06/23  3:05 AM  Result Value Ref Range   WBC 17.7 (H) 4.0 - 10.5 K/uL   RBC 4.85 4.22 - 5.81 MIL/uL   Hemoglobin 13.9 13.0 - 17.0 g/dL   HCT 21.3 08.6 - 57.8 %   MCV 91.8 80.0 - 100.0 fL   MCH 28.7 26.0 - 34.0 pg   MCHC 31.2 30.0 - 36.0 g/dL   RDW 46.9 62.9 - 52.8 %   Platelets 446 (H) 150 - 400 K/uL   nRBC  0.0 0.0 - 0.2 %    Comment: Performed at [facility], [address] [place], [place] [address]  Type and screen [facility]     Status: None   Collection Time: 10/06/23  3:05 AM  Result Value Ref Range   ABO/RH(D) O NEG    Antibody Screen NEG    Sample Expiration      10/09/2023,2359 Performed at [facility], [address] [place], [place] [address]   Protime-INR - (order if Patient is taking Coumadin / Warfarin)     Status: None   Collection Time: 10/06/23  3:05 AM  Result Value Ref Range   Prothrombin Time 15.2 11.4 - 15.2 seconds   INR 1.2 0.8 - 1.2    Comment: (NOTE) INR goal varies based on device and disease states. Performed at [facility], [address] [place], [place] [address]   Troponin I (High Sensitivity)     Status: None   Collection Time: 10/06/23  3:05 AM  Result Value Ref Range   Troponin I (High Sensitivity) 4 <18 ng/L    Comment: (NOTE) Elevated high sensitivity troponin I (hsTnI) values and significant  changes across serial measurements may suggest ACS but many other  chronic and acute conditions are known to elevate hsTnI results.  Refer to the "Links" section for chest pain algorithms and additional  guidance. Performed at [facility], [address], [place], [place] [address]   POC occult blood, ED     Status: Abnormal   Collection Time: 10/06/23  4:45 AM  Result Value Ref Range   Fecal Occult Bld POSITIVE (A) NEGATIVE  CBC     Status: Abnormal   Collection Time: 10/06/23  5:43 AM  Result Value Ref Range   WBC 15.1 (H) 4.0 - 10.5 K/uL   RBC 4.44 4.22 - 5.81 MIL/uL   Hemoglobin 13.1 13.0 - 17.0 g/dL   HCT 47.4 25.9 - 56.3 %   MCV 91.7 80.0 - 100.0 fL   MCH 29.5 26.0 - 34.0 pg   MCHC 32.2 30.0 - 36.0 g/dL   RDW 87.5 64.3 - 32.9 %   Platelets 377 150 - 400 K/uL   nRBC 0.0 0.0 - 0.2 %    Comment: Performed at [facility],  [address] [place], [place] [address]  Review of Systems  Constitutional:  Positive for activity change. Negative for appetite change, chills, diaphoresis, fatigue, fever and unexpected weight change.  HENT: Negative.    Eyes: Negative.  Respiratory:  Positive for shortness of breath.   Cardiovascular:  Positive for palpitations.  Gastrointestinal:  Positive for abdominal pain, anal bleeding, blood in stool and diarrhea. Negative for abdominal distention, constipation, nausea, rectal pain and vomiting.  Genitourinary: Negative.   Musculoskeletal:  Positive for arthralgias and back pain.  Skin:  Positive for pallor.  Allergic/Immunologic: Negative.   Neurological:  Positive for weakness. Negative for tremors, seizures, syncope, facial asymmetry, speech difficulty, light-headedness, numbness and headaches.  Hematological: Negative.   Psychiatric/Behavioral:  The patient is nervous/anxious.    Blood pressure 123/69, pulse (!) 117, temperature 98.1 F (36.7 C), temperature source Oral, resp. rate 19, height 5\' 10"  (1.778 m), weight (!) 190 kg, SpO2 93%. Physical Exam Vitals reviewed.  Constitutional:      General: He is not in acute distress.    Appearance: He is obese. He is not ill-appearing, toxic-appearing or diaphoretic.  HENT:     Head: Normocephalic and atraumatic.     Mouth/Throat:     Mouth: Mucous membranes are dry.  Eyes:     Extraocular Movements: Extraocular movements intact.     Pupils: Pupils are equal, round, and reactive to light.  Cardiovascular:     Rate and Rhythm: Tachycardia present.     Pulses: Normal pulses.     Heart sounds: Normal heart sounds.  Pulmonary:     Effort: Pulmonary effort is normal.     Breath sounds: Normal breath sounds.  Abdominal:     General: Bowel sounds are normal. There is no distension.     Palpations: Abdomen is soft.     Tenderness: There is abdominal tenderness. There is guarding.     Comments: There is a large surgical  scar in the midline from previous gastric bypass  Musculoskeletal:     Cervical back: Normal range of motion and neck supple.  Skin:    General: Skin is warm and dry.  Neurological:     Mental Status: He is alert and oriented to person, place, and time.  Psychiatric:        Mood and Affect: Mood normal.        Thought Content: Thought content normal.        Judgment: Judgment normal.   Assessment/Plan: 1) Melenic stools for 3 days-patient has had a history of gastric bypass he may have an anastomotic ulcer as he is recently restarted on Eliquis plans are to do an endoscopy after couple of days once the Eliquis washes out. 2) Colonic diverticulosis. 3) GERD-on PPI's. 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 5) atrial flutter with rapid ventricular response-recently started on Eliquis which is on hold-patient is on a Cardizem drip now. 6) HTN. 7) Depression. 8) Morbid obesity BMI is 63.3. 9) Severe obstructive sleep apnea.  Malani Lees 10/06/2023, 9:22 AM

## 2023-10-06 NOTE — Consult Note (Signed)
 Cardiology Consultation   Patient ID: Cody Hampton MRN: [id number]; DOB: [birth year]  Admit date: 10/06/2023 Date of Consult: 10/06/2023  PCP:  Mattie Marlin, DO   [facility] Providers Cardiologist:  None   Afib clinic     Patient Profile:   Cody Hampton is a 63 y.o. male with a hx of hypertension, morbid obesity with a BMI of 60, obstructive sleep apnea, and history of atrial flutter who has been followed by the atrial fibrillation clinic and electrophysiology since December 2024 for atrial arrhythmia who is being seen 10/06/2023 for the evaluation of tarry dark stools while on Eliquis at the request of Dr. Jeraldine Loots.  History of Present Illness:   Cody Hampton is a 63 year old male with the above-mentioned history initially seen in the A-fib clinic for atrial flutter with rapid ventricular response status post successful cardioversion but early recurrence of atrial fibrillation, first occurrence of atrial arrhythmia while at the dentist receiving local anesthetic.  Medication noncompliance has been questioned, and he has intermittently been on Eliquis 5 mg twice daily with a CHA2DS2-VASc score of at least 1, limited somewhat by cost.  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  Most recently seen by EP 09/27/2023 due to atrial flutter with rapid ventricular response.  Not felt to be a good candidate for ablation given body habitus and not a candidate for TEE with intermittent compliance to anticoagulation and body habitus and obstructive breathing pattern.  Lopressor was initially increased to 75 mg twice daily but patient declined to take due to feeling fatigued previously.  Discussed with Otilio Saber, PA-C while in the ED during that visit, they were agreeable to starting bisoprolol 5 mg.  Not felt to be a candidate for antiarrhythmic therapy due to noncompliance and lack of anticoagulation.  Patient has otherwise been agreeable to oral  diltiazem.  Heart rates continue to be in the 110s to 120s and typical atrial flutter with rapid ventricular response.  Pertinent laboratory studies in the ED include sodium 135 potassium 4 magnesium 1.8 last week, normal LFTs normal troponin.  Elevated white blood cell count 15.1, normal hemoglobin though downtrended about a gram and a half, normal platelet count, also down trended.  TSH normal.  Fecal occult blood is positive and patient presented to the ED with dark tarry stools.  GI consult is pending.  EKG demonstrates atrial flutter with a rate of 146.  Past Medical History:  Diagnosis Date   Arthritis    in knees   Depression    Hypertension    Obesity    Severe sleep apnea     History reviewed. No pertinent surgical history.   Home Medications:  Prior to Admission medications   Medication Sig Start Date End Date Taking? Authorizing Provider  apixaban (ELIQUIS) 5 MG TABS tablet Take 1 tablet (5 mg total) by mouth 2 (two) times daily. 09/26/23  Yes Eustace Pen, PA-C  citalopram (CELEXA) 20 MG tablet Take 1 tablet by mouth 2 (two) times daily. 06/28/23  Yes [provider]  diltiazem (CARDIZEM CD) 240 MG 24 hr capsule Take 1 capsule (240 mg total) by mouth daily. 10/01/23  Yes Eustace Pen, PA-C  loperamide (IMODIUM A-D) 2 MG tablet Take 4 mg by mouth daily.   Yes [provider]  Multiple Vitamins-Minerals (BARIATRIC MULTIVITAMINS/IRON) CAPS Take 1 capsule by mouth daily with breakfast.   Yes [provider]  oxyCODONE-acetaminophen (PERCOCET/ROXICET) 5-325 MG tablet Take 1  tablet by mouth daily as needed for moderate pain (pain score 4-6) or severe pain (pain score 7-10).   Yes [provider]  pantoprazole (PROTONIX) 40 MG tablet Take 40 mg by mouth as needed (for reflux or GERD symptoms).   Yes [provider]  polycarbophil (FIBERCON) 625 MG tablet Take 625 mg by mouth daily.   Yes [provider]  testosterone  cypionate (DEPOTESTOSTERONE CYPIONATE) 200 MG/ML injection Inject into the muscle every 14 (fourteen) days. 09/14/23  Yes [provider]  bisoprolol (ZEBETA) 5 MG tablet Take 5 mg by mouth daily. Patient not taking: Reported on 10/06/2023    [provider]    Inpatient Medications: Scheduled Meds:  citalopram  20 mg Oral BID   metoprolol tartrate  75 mg Oral BID   pantoprazole (PROTONIX) IV  40 mg Intravenous Q12H   Continuous Infusions:  diltiazem (CARDIZEM) infusion 15 mg/hr (10/06/23 0517)   PRN Meds: acetaminophen, glucagon (human recombinant), hydrALAZINE, ipratropium-albuterol, metoprolol tartrate, ondansetron (ZOFRAN) IV, traZODone  Allergies:    Allergies  Allergen Reactions   Metoprolol Anxiety, Hypertension, Nausea Only, Other (See Comments), Photosensitivity and Shortness Of Breath   Duloxetine Other (See Comments)    Made mood worse.   Gabapentin Other (See Comments)    "Not tolerated well."   Hydrocodone Other (See Comments)    "Made me feel odd in my head."    Social History:   Social History   Socioeconomic History   Marital status: Married    Spouse name: Not on file   Number of children: Not on file   Years of education: Not on file   Highest education level: Not on file  Occupational History   Not on file  Tobacco Use   Smoking status: Never   Smokeless tobacco: Never   Tobacco comments:    Never smoked 07/05/23  Substance and Sexual Activity   Alcohol use: Not Currently   Drug use: Never   Sexual activity: Not on file  Other Topics Concern   Not on file  Social History Narrative   Not on file   Social Drivers of Health   Financial Resource Strain: Low Risk  (01/10/2022)   [facility] prior to 09/09[facility]22[facility] visits prior to 09/09/2022., [facility]   Overall Financial Resource Strain (CARDIA)    Difficulty of Paying Living Expenses: Not  very hard  Food Insecurity: Low Risk  (09/24/2023)   [facility] from [facility]   Hunger Vital Sign    Worried About Running Out of Food in the Last Year: Never true    Ran Out of Food in the Last Year: Never true  Transportation Needs: No Transportation Needs (09/24/2023)   Received from [employer]    In the past 12 months, has lack of reliable transportation kept you from medical appointments, meetings, work or from getting things needed for daily living? : No  Physical Activity: Insufficiently Active (01/10/2022)   [facility], [facility] visits prior to 09/09/2022., [facility] visits prior to 09/09/2022., [facility]   Exercise Vital Sign    Days of Exercise per Week: 3 days    Minutes of Exercise per Session: 10 min  Stress: Stress Concern Present (01/10/2022)   [facility], [facility] visits prior to 09/09/2022., [facility], [facility] visits prior to  09/09/2022.   [employer] of Occupational Health - Occupational Stress Questionnaire    Feeling of Stress : To some extent  Social Connections: Unknown (01/10/2022)   [facility] visits prior to 09/09/2022., [facility], [facility] visits prior to 09/09/2022., [facility]   Social Connection and Isolation Panel [NHANES]    Frequency of Communication with Friends and Family: Not on file    Frequency of Social Gatherings with Friends and Family: Never    Attends Religious Services: Never    Database administrator or Organizations: No    Attends Engineer, structural: Never    Marital Status: Married  Catering manager Violence: Not on file    Family History:   History reviewed. No pertinent family history.   ROS:  Please see the history of present illness.   All other ROS reviewed and negative.     Physical Exam/Data:    Vitals:   10/06/23 0628 10/06/23 0630 10/06/23 0810 10/06/23 1027  BP:   123/69   Pulse:  (!) 118 (!) 117   Resp:  18 19   Temp: 98.1 F (36.7 C)   98.5 F (36.9 C)  TempSrc: Oral   Oral  SpO2:  93% 93%   Weight:      Height:       No intake or output data in the 24 hours ending 10/06/23 1028    10/06/2023    2:46 AM 09/27/2023   10:19 AM 09/27/2023    9:27 AM  Last 3 Weights  Weight (lbs) 418 lb 14 oz 420 lb 420 lb 12.8 oz  Weight (kg) 190 kg 190.511 kg 190.874 kg     Body mass index is 60.1 kg/m.  General:  obese male in no acute distress HEENT: normal Neck: JVD unable to be assessed Cardiac:  tachycardic and regular no murmur  Lungs:  clear to auscultation bilaterally,  Abd: soft, nontender, no hepatomegaly  Ext: no edema Musculoskeletal:  No deformities, BUE and BLE strength normal and equal Skin: warm and dry  Neuro:  CNs 2-12 intact, no focal abnormalities noted Psych:  Normal affect   EKG:  The EKG was personally reviewed and demonstrates: Atrial flutter heart rate 146 Telemetry:  Telemetry was personally reviewed and demonstrates: Atrial flutter rates 110s to 120s  Relevant CV Studies: Echocardiogram 08/07/2023 technically difficult study, EF 60 to 65% normal diastolic function, normal RV function with severely enlarged RV size and normal RVSP.  On my independent review of the echocardiogram, RV appears only mildly enlarged.  Laboratory Data:  High Sensitivity Troponin:   Recent Labs  Lab 10/06/23 0305  TROPONINIHS 4     Chemistry Recent Labs  Lab 10/06/23 0305  NA 135  K 4.0  CL 102  CO2 21*  GLUCOSE 172*  BUN 13  CREATININE 0.69  CALCIUM 8.2*  GFRNONAA >60  ANIONGAP 12    Recent Labs  Lab 10/06/23 0305  PROT 7.2  ALBUMIN 3.7  AST 23  ALT 15  ALKPHOS 77  BILITOT 0.6   Lipids No results for input(s): "CHOL", "TRIG", "HDL", "LABVLDL", "LDLCALC", "CHOLHDL" in the last 168 hours.  Hematology Recent Labs  Lab 10/06/23 0305  10/06/23 0543  WBC 17.7* 15.1*  RBC 4.85 4.44  HGB 13.9 13.1  HCT 44.5 40.7  MCV 91.8 91.7  MCH 28.7 29.5  MCHC 31.2 32.2  RDW 14.4 14.2  PLT 446* 377   Thyroid  Recent Labs  Lab 10/06/23 0545  TSH 3.020    BNPNo results for input(s): "BNP", "PROBNP" in the last 168 hours.  DDimer No results for input(s): "DDIMER" in the last 168 hours.   Radiology/Studies:  No results found.   Assessment and Plan:   #Atrial flutter RVR #FOBT positive -Normal hemoglobin at this time but patient reports dark and tarry stools, trend CBC per primary service -Patient is currently on IV diltiazem with improvement in rate control.  Patient has refused Lopressor, agreeable to diltiazem orally, start 30 mg Q6H and titrate, with goal of HR <100 and ability to wean dilt infusion.  - Issues with noncompliance at home. -GI to see. -Okay to hold Eliquis at this time with GI evaluation pending.   Risk Assessment/Risk Scores:          CHA2DS2-VASc Score = 1   This indicates a 0.6% annual risk of stroke. The patient's score is based upon: CHF History: 0 HTN History: 1 Diabetes History: 0 Stroke History: 0 Vascular Disease History: 0 Age Score: 0 Gender Score: 0          For questions or updates, please contact [facility] Please consult [email or web address] for contact info under    Signed, Parke Poisson, MD  10/06/2023 10:28 AM

## 2023-10-06 NOTE — Progress Notes (Signed)
 PROGRESS NOTE    Cody Hampton  ZOX:[id number] DOB: 1960/11/11 DOA: 10/06/2023 PCP: Mattie Marlin, DO    Brief Narrative:   63 year old history of A-fib on Eliquis started about 10 days ago, sleep apnea, gastric bypass, MGUS comes to the ER with 3 days of dark tarry stools.  In the ER noted to be in A-fib with RVR started on Cardizem drip, hemoglobin 13.9, Hemoccult positive.  GI consulted.  Assessment & Plan:  Principal Problem:   Atrial flutter with rapid ventricular response (HCC) Active Problems:   Anxiety and depression   MGUS (monoclonal gammopathy of unknown significance)   OSA (obstructive sleep apnea)   S/P bariatric surgery   Atrial flutter (HCC)   GI bleeding   Acute GI bleed - Suspect in the setting of starting Eliquis recently for atrial fibrillation.  [facility] per patient was in 2021-normal.  Colonoscopy 2021 incomplete due to redundant colon. -Baseline hemoglobin 14, remained stable.  GI consulted -PPI  Atrial Flutter/fib with RVR Essential hypertension -Currently on Cardizem drip, will slowly transition to outpatient p.o. metoprolol.  Eliquis on hold due to GI bleed.  Recently seen by outpatient EP -Cardiology consulted by admitting provider - TSH - Echo January 2025-preserved EF, enlarged RV - IV as needed  Anxiety/depression - Celexa  Sleep apnea - Bedtime CPAP if necessary  Chronic pain - As needed meds  MGUS - Followed by outpatient oncology  Status post bariatric surgery - On B12 supplements   DVT prophylaxis: SCDs Start: 10/06/23 0544    Code Status: Full Code Family Communication: Son at bedside Continue hospital stay for GI bleed evaluation    Subjective:  No complaints at this time.  Denies any bleeding this morning  Examination:  General exam: Appears calm and comfortable, morbid obesity Respiratory system: Clear to auscultation. Respiratory effort normal. Cardiovascular system: Tachycardia, irregularly  irregular Gastrointestinal system: Abdomen is nondistended, soft and nontender. No organomegaly or masses felt. Normal bowel sounds heard. Central nervous system: Alert and oriented. No focal neurological deficits. Extremities: Symmetric 5 x 5 power. Skin: No rashes, lesions or ulcers Psychiatry: Judgement and insight appear normal. Mood & affect appropriate.                Diet Orders (From admission, onward)     Start     Ordered   10/06/23 1003  Diet clear liquid Room service appropriate? Yes; Fluid consistency: Thin  Diet effective now       Question Answer Comment  Room service appropriate? Yes   Fluid consistency: Thin      10/06/23 1002            Objective: Vitals:   10/06/23 0600 10/06/23 0628 10/06/23 0630 10/06/23 0810  BP: (!) 146/120   123/69  Pulse: (!) 122  (!) 118 (!) 117  Resp: 20  18 19   Temp:  98.1 F (36.7 C)    TempSrc:  Oral    SpO2: 95%  93% 93%  Weight:      Height:       No intake or output data in the 24 hours ending 10/06/23 1012 Filed Weights   10/06/23 0246  Weight: (!) 190 kg    Scheduled Meds:  citalopram  20 mg Oral BID   metoprolol tartrate  75 mg Oral BID   pantoprazole (PROTONIX) IV  40 mg Intravenous Q12H   Continuous Infusions:  diltiazem (CARDIZEM) infusion 15 mg/hr (10/06/23 0517)    Nutritional status  Body mass index is 60.1 kg/m.  Data Reviewed:   CBC: Recent Labs  Lab 10/06/23 0305 10/06/23 0543  WBC 17.7* 15.1*  HGB 13.9 13.1  HCT 44.5 40.7  MCV 91.8 91.7  PLT 446* 377   Basic Metabolic Panel: Recent Labs  Lab 10/06/23 0305  NA 135  K 4.0  CL 102  CO2 21*  GLUCOSE 172*  BUN 13  CREATININE 0.69  CALCIUM 8.2*   GFR: Estimated Creatinine Clearance: 158.1 mL/min (by C-G formula based on SCr of 0.69 mg/dL). Liver Function Tests: Recent Labs  Lab 10/06/23 0305  AST 23  ALT 15  ALKPHOS 77  BILITOT 0.6  PROT 7.2  ALBUMIN 3.7   No results for input(s): "LIPASE", "AMYLASE"  in the last 168 hours. No results for input(s): "AMMONIA" in the last 168 hours. Coagulation Profile: Recent Labs  Lab 10/06/23 0305  INR 1.2   Cardiac Enzymes: No results for input(s): "CKTOTAL", "CKMB", "CKMBINDEX", "TROPONINI" in the last 168 hours. BNP (last 3 results) No results for input(s): "PROBNP" in the last 8760 hours. HbA1C: No results for input(s): "HGBA1C" in the last 72 hours. CBG: No results for input(s): "GLUCAP" in the last 168 hours. Lipid Profile: No results for input(s): "CHOL", "HDL", "LDLCALC", "TRIG", "CHOLHDL", "LDLDIRECT" in the last 72 hours. Thyroid Function Tests: Recent Labs    10/06/23 0545  TSH 3.020   Anemia Panel: No results for input(s): "VITAMINB12", "FOLATE", "FERRITIN", "TIBC", "IRON", "RETICCTPCT" in the last 72 hours. Sepsis Labs: No results for input(s): "PROCALCITON", "LATICACIDVEN" in the last 168 hours.  No results found for this or any previous visit (from the past 240 hours).       Radiology Studies: No results found.         LOS: 0 days   Time spent= 35 mins    Miguel Rota, MD [facility]  If 7PM-7AM, please contact night-coverage  10/06/2023, 10:12 AM

## 2023-10-07 DIAGNOSIS — Z8711 Personal history of peptic ulcer disease: Secondary | ICD-10-CM | POA: Diagnosis not present

## 2023-10-07 DIAGNOSIS — K219 Gastro-esophageal reflux disease without esophagitis: Secondary | ICD-10-CM | POA: Diagnosis present

## 2023-10-07 DIAGNOSIS — Q438 Other specified congenital malformations of intestine: Secondary | ICD-10-CM | POA: Diagnosis not present

## 2023-10-07 DIAGNOSIS — D72829 Elevated white blood cell count, unspecified: Secondary | ICD-10-CM | POA: Diagnosis present

## 2023-10-07 DIAGNOSIS — G4733 Obstructive sleep apnea (adult) (pediatric): Secondary | ICD-10-CM | POA: Diagnosis present

## 2023-10-07 DIAGNOSIS — I483 Typical atrial flutter: Secondary | ICD-10-CM | POA: Diagnosis present

## 2023-10-07 DIAGNOSIS — Z7901 Long term (current) use of anticoagulants: Secondary | ICD-10-CM | POA: Diagnosis not present

## 2023-10-07 DIAGNOSIS — D75839 Thrombocytosis, unspecified: Secondary | ICD-10-CM | POA: Diagnosis present

## 2023-10-07 DIAGNOSIS — D472 Monoclonal gammopathy: Secondary | ICD-10-CM | POA: Diagnosis present

## 2023-10-07 DIAGNOSIS — I480 Paroxysmal atrial fibrillation: Secondary | ICD-10-CM | POA: Diagnosis present

## 2023-10-07 DIAGNOSIS — I4892 Unspecified atrial flutter: Secondary | ICD-10-CM | POA: Diagnosis present

## 2023-10-07 DIAGNOSIS — I119 Hypertensive heart disease without heart failure: Secondary | ICD-10-CM | POA: Diagnosis present

## 2023-10-07 DIAGNOSIS — I10 Essential (primary) hypertension: Secondary | ICD-10-CM | POA: Diagnosis not present

## 2023-10-07 DIAGNOSIS — Z6841 Body Mass Index (BMI) 40.0 and over, adult: Secondary | ICD-10-CM | POA: Diagnosis not present

## 2023-10-07 DIAGNOSIS — K921 Melena: Secondary | ICD-10-CM | POA: Diagnosis present

## 2023-10-07 DIAGNOSIS — Z91148 Patient's other noncompliance with medication regimen for other reason: Secondary | ICD-10-CM | POA: Diagnosis not present

## 2023-10-07 DIAGNOSIS — R55 Syncope and collapse: Secondary | ICD-10-CM | POA: Diagnosis present

## 2023-10-07 DIAGNOSIS — F32A Depression, unspecified: Secondary | ICD-10-CM | POA: Diagnosis present

## 2023-10-07 DIAGNOSIS — F418 Other specified anxiety disorders: Secondary | ICD-10-CM | POA: Diagnosis not present

## 2023-10-07 DIAGNOSIS — Z79899 Other long term (current) drug therapy: Secondary | ICD-10-CM | POA: Diagnosis not present

## 2023-10-07 DIAGNOSIS — D6832 Hemorrhagic disorder due to extrinsic circulating anticoagulants: Secondary | ICD-10-CM | POA: Diagnosis present

## 2023-10-07 DIAGNOSIS — G8929 Other chronic pain: Secondary | ICD-10-CM | POA: Diagnosis present

## 2023-10-07 DIAGNOSIS — Z91199 Patient's noncompliance with other medical treatment and regimen due to unspecified reason: Secondary | ICD-10-CM | POA: Diagnosis not present

## 2023-10-07 DIAGNOSIS — Z885 Allergy status to narcotic agent status: Secondary | ICD-10-CM | POA: Diagnosis not present

## 2023-10-07 DIAGNOSIS — Z9884 Bariatric surgery status: Secondary | ICD-10-CM | POA: Diagnosis not present

## 2023-10-07 DIAGNOSIS — F419 Anxiety disorder, unspecified: Secondary | ICD-10-CM | POA: Diagnosis present

## 2023-10-07 LAB — BASIC METABOLIC PANEL WITH GFR
Anion gap: 9 (ref 5–15)
BUN: 15 mg/dL (ref 8–23)
CO2: 25 mmol/L (ref 22–32)
Calcium: 8.4 mg/dL — ABNORMAL LOW (ref 8.9–10.3)
Chloride: 102 mmol/L (ref 98–111)
Creatinine, Ser: 0.73 mg/dL (ref 0.61–1.24)
GFR, Estimated: >60 mL/min (ref >60)
Glucose, Bld: 141 mg/dL — ABNORMAL HIGH (ref 70–99)
Potassium: 4 mmol/L (ref 3.5–5.1)
Sodium: 136 mmol/L (ref 135–145)

## 2023-10-07 LAB — CBC
HCT: 42.8 % (ref 39.0–52.0)
Hemoglobin: 13.5 g/dL (ref 13.0–17.0)
MCH: 29.2 pg (ref 26.0–34.0)
MCHC: 31.5 g/dL (ref 30.0–36.0)
MCV: 92.4 fL (ref 80.0–100.0)
Platelets: 407 10*3/uL — ABNORMAL HIGH (ref 150–400)
RBC: 4.63 MIL/uL (ref 4.22–5.81)
RDW: 14.3 % (ref 11.5–15.5)
WBC: 14.1 10*3/uL — ABNORMAL HIGH (ref 4.0–10.5)
nRBC: 0 % (ref 0.0–0.2)

## 2023-10-07 LAB — MAGNESIUM: Magnesium: 2.1 mg/dL (ref 1.7–2.4)

## 2023-10-07 LAB — PHOSPHORUS: Phosphorus: 2.6 mg/dL (ref 2.5–4.6)

## 2023-10-07 MED ORDER — DILTIAZEM HCL 60 MG PO TABS
60.0000 mg | ORAL_TABLET | Freq: Four times a day (QID) | ORAL | Status: DC
  Administered 2023-10-07 – 2023-10-08 (×3): 60 mg via ORAL
  Filled 2023-10-07 (×3): qty 1

## 2023-10-07 MED ORDER — SODIUM CHLORIDE 0.9 % IV SOLN: INTRAVENOUS | Status: AC

## 2023-10-07 MED ORDER — OXYCODONE-ACETAMINOPHEN 5-325 MG PO TABS
1.0000 | ORAL_TABLET | Freq: Every day | ORAL | Status: DC | PRN
  Administered 2023-10-07 – 2023-10-08 (×2): 1 via ORAL
  Filled 2023-10-07 (×2): qty 1

## 2023-10-07 MED ORDER — LOPERAMIDE HCL 2 MG PO CAPS
4.0000 mg | ORAL_CAPSULE | Freq: Every day | ORAL | Status: DC
  Administered 2023-10-07 – 2023-10-10 (×4): 4 mg via ORAL
  Filled 2023-10-07 (×4): qty 2

## 2023-10-07 MED ORDER — ADVANCED MULTI EA PO CHEW
1.0000 | CHEWABLE_TABLET | Freq: Every day | ORAL | Status: DC
  Administered 2023-10-09 – 2023-10-10 (×2): 1 via ORAL
  Filled 2023-10-07 (×3): qty 1

## 2023-10-07 MED ORDER — DICLOFENAC SODIUM 1 % EX GEL
2.0000 g | Freq: Four times a day (QID) | CUTANEOUS | Status: DC
  Administered 2023-10-07 – 2023-10-10 (×13): 2 g via TOPICAL
  Filled 2023-10-07: qty 100

## 2023-10-07 NOTE — Progress Notes (Signed)
 UNASSIGNED PATIENT Subjective: Patient has not had any BM since admission. He denies having any abdominal pain, nausea or vomiting.  He is awaiting Eliquis washout before any invasive procedures can be done.  Objective: Vital signs in last 24 hours: Temp:  [98 F (36.7 C)-98.5 F (36.9 C)] 98 F (36.7 C) (03/30 0400) Pulse Rate:  [69-149] 69 (03/30 0700) Resp:  [13-29] 14 (03/30 0700) BP: (106-170)/(64-121) 157/71 (03/30 0739) SpO2:  [92 %-96 %] 92 % (03/30 0700) Weight:  [200.1 kg] 200.1 kg (03/29 1223) Last BM Date : 10/05/23  Intake/Output from previous day: 03/29 0701 - 03/30 0700 In: 257.4 [I.V.:257.4] Out: 320 [Urine:320] Intake/Output this shift: Total I/O In: 214 [I.V.:214] Out: -   General appearance: alert, cooperative, appears older than stated age, morbidly obese, and pale Resp: clear to auscultation bilaterally Cardio: regular rate and rhythm, S1, S2 normal, no murmur, click, rub or gallop GI: soft, non-tender; bowel sounds normal; no masses,  no organomegaly  Lab Results: Recent Labs    10/06/23 0543 10/06/23 1856 10/07/23 0235  WBC 15.1* 14.6* 14.1*  HGB 13.1 13.7 13.5  HCT 40.7 44.4 42.8  PLT 377 417* 407*   BMET Recent Labs    10/06/23 0305 10/07/23 0235  NA 135 136  K 4.0 4.0  CL 102 102  CO2 21* 25  GLUCOSE 172* 141*  BUN 13 15  CREATININE 0.69 0.73  CALCIUM 8.2* 8.4*   LFT Recent Labs    10/06/23 0305  PROT 7.2  ALBUMIN 3.7  AST 23  ALT 15  ALKPHOS 77  BILITOT 0.6   PT/INR Recent Labs    10/06/23 0305  LABPROT 15.2  INR 1.2   Studies/Results: No results found.  Medications: I have reviewed the patient's current medications. Prior to Admission:  Medications Prior to Admission  Medication Sig Dispense Refill Last Dose/Taking   apixaban (ELIQUIS) 5 MG TABS tablet Take 1 tablet (5 mg total) by mouth 2 (two) times daily. 60 tablet 3 10/05/2023 at 11:00 PM   citalopram (CELEXA) 20 MG tablet Take 1 tablet by mouth 2 (two)  times daily.   10/05/2023   diltiazem (CARDIZEM CD) 240 MG 24 hr capsule Take 1 capsule (240 mg total) by mouth daily. 30 capsule 3 10/05/2023   loperamide (IMODIUM A-D) 2 MG tablet Take 4 mg by mouth daily.   10/05/2023   Multiple Vitamins-Minerals (BARIATRIC MULTIVITAMINS/IRON) CAPS Take 1 capsule by mouth daily with breakfast.   10/05/2023   oxyCODONE-acetaminophen (PERCOCET/ROXICET) 5-325 MG tablet Take 1 tablet by mouth daily as needed for moderate pain (pain score 4-6) or severe pain (pain score 7-10).   Past Month   pantoprazole (PROTONIX) 40 MG tablet Take 40 mg by mouth as needed (for reflux or GERD symptoms).   Unknown   polycarbophil (FIBERCON) 625 MG tablet Take 625 mg by mouth daily.   10/05/2023   testosterone cypionate (DEPOTESTOSTERONE CYPIONATE) 200 MG/ML injection Inject into the muscle every 14 (fourteen) days.   Past Month   bisoprolol (ZEBETA) 5 MG tablet Take 5 mg by mouth daily. (Patient not taking: Reported on 10/06/2023)   Not Taking   Scheduled:  [START ON 10/08/2023] bariatric multiple vitamin  1 tablet Oral Q breakfast   Chlorhexidine Gluconate Cloth  6 each Topical Daily   citalopram  20 mg Oral BID   diclofenac Sodium  2 g Topical QID   diltiazem  30 mg Oral Q6H   loperamide  4 mg Oral Daily   pantoprazole (PROTONIX)  IV  40 mg Intravenous Q12H   Continuous:  diltiazem (CARDIZEM) infusion 12.5 mg/hr (10/07/23 0831)   ZOX:[id number], diltiazem, glucagon (human recombinant), hydrALAZINE, ipratropium-albuterol, metoprolol tartrate, ondansetron (ZOFRAN) IV, oxyCODONE-acetaminophen, traZODone  Assessment/Plan: 1) Melenic stools for 3 days-patient has had a history of gastric bypass he may have an anastomotic ulcer as he is recently restarted on Eliquis plans are to do an endoscopy after couple of days once the Eliquis washes out. 2) Colonic diverticulosis. 3) GERD-on PPI's. 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 5) atrial flutter with rapid  ventricular response-recently started on Eliquis which is on hold-patient is on a Cardizem drip now. 6) HTN. 7) Depression. 8) Morbid obesity BMI is 60. 9) Severe obstructive sleep apnea.  LOS: 0 days   Cody Hampton 10/07/2023, 7:57 AM

## 2023-10-07 NOTE — Care Management Obs Status (Signed)
 MEDICARE OBSERVATION STATUS NOTIFICATION   Patient Details  Name: Cody Hampton MRN: [id number] Date of Birth: Aug 08, 1960   Medicare Observation Status Notification Given:  Hart Robinsons, LCSW 10/07/2023, 9:36 AM

## 2023-10-07 NOTE — Progress Notes (Signed)
 PROGRESS NOTE    Cody Hampton  NWG:[id number] DOB: May 22, 1961 DOA: 10/06/2023 PCP: Mattie Marlin, DO    Brief Narrative:   63 year old history of A-fib on Eliquis started about 10 days ago, sleep apnea, gastric bypass, MGUS comes to the ER with 3 days of dark tarry stools.  In the ER noted to be in A-fib with RVR started on Cardizem drip, hemoglobin 13.9, Hemoccult positive.  GI consulted.  Assessment & Plan:  Principal Problem:   Atrial flutter with rapid ventricular response (HCC) Active Problems:   Anxiety and depression   MGUS (monoclonal gammopathy of unknown significance)   OSA (obstructive sleep apnea)   S/P bariatric surgery   Atrial flutter (HCC)   GI bleeding   Acute GI bleed - Suspect in the setting of starting Eliquis recently for atrial fibrillation.  At [facility] EGD 2021 per patient was in 2021-normal.  Colonoscopy 2021 incomplete due to redundant colon. -Baseline hemoglobin 14, remained stable.  GI following, continue PPI twice daily. - Eliquis washout thereafter endoscopy  Atrial Flutter/fib with RVR Essential hypertension - Currently on Cardizem drip along with p.o.  Multiple IV as needed doses given including digoxin - Cardiology following - TSH-normal - Echo January 2025-preserved EF, enlarged RV - IV as needed  Leukocytosis - Looking at previous trend appears to be chronic.  No signs of infection  Anxiety/depression - Celexa  Sleep apnea - Bedtime CPAP if necessary  Chronic pain - As needed meds  MGUS - Followed by outpatient oncology  Status post bariatric surgery - On B12 supplements   DVT prophylaxis: SCDs Start: 10/06/23 0544    Code Status: Full Code Family Communication: Son at bedside Controlling A-fib and ongoing evaluation for GI bleed    Subjective:  No complaints at this time.  Denies any bleeding this morning Heart rate fluctuating between 90-1 15  Examination:  General exam: Appears calm and comfortable, morbid  obesity Respiratory system: Clear to auscultation. Respiratory effort normal. Cardiovascular system: Tachycardia, irregularly irregular Gastrointestinal system: Abdomen is nondistended, soft and nontender. No organomegaly or masses felt. Normal bowel sounds heard. Central nervous system: Alert and oriented. No focal neurological deficits. Extremities: Symmetric 5 x 5 power. Skin: No rashes, lesions or ulcers Psychiatry: Judgement and insight appear normal. Mood & affect appropriate.                Diet Orders (From admission, onward)     Start     Ordered   10/06/23 1003  Diet clear liquid Room service appropriate? Yes; Fluid consistency: Thin  Diet effective now       Question Answer Comment  Room service appropriate? Yes   Fluid consistency: Thin      10/06/23 1002            Objective: Vitals:   10/07/23 0700 10/07/23 0739 10/07/23 0800 10/07/23 0900  BP: (!) 157/71 (!) 157/71 (!) 167/130 (!) 170/70  Pulse: 69  (!) 101 90  Resp: 14  20 18   Temp:   97.8 F (36.6 C)   TempSrc:   Oral   SpO2: 92%  94% 94%  Weight:      Height:        Intake/Output Summary (Last 24 hours) at 10/07/2023 1016 Last data filed at 10/07/2023 0831 Gross per 24 hour  Intake 492.52 ml  Output 320 ml  Net 172.52 ml   Filed Weights   10/06/23 0246 10/06/23 1223  Weight: (!) 190 kg (!) 200.1 kg  Scheduled Meds:  [START ON 10/08/2023] bariatric multiple vitamin  1 tablet Oral Q breakfast   Chlorhexidine Gluconate Cloth  6 each Topical Daily   citalopram  20 mg Oral BID   diclofenac Sodium  2 g Topical QID   diltiazem  60 mg Oral Q6H   loperamide  4 mg Oral Daily   pantoprazole (PROTONIX) IV  40 mg Intravenous Q12H   Continuous Infusions:  diltiazem (CARDIZEM) infusion 12.5 mg/hr (10/07/23 0831)    Nutritional status     Body mass index is 63.3 kg/m.  Data Reviewed:   CBC: Recent Labs  Lab 10/06/23 0305 10/06/23 0543 10/06/23 1856 10/07/23 0235  WBC 17.7*  15.1* 14.6* 14.1*  HGB 13.9 13.1 13.7 13.5  HCT 44.5 40.7 44.4 42.8  MCV 91.8 91.7 94.3 92.4  PLT 446* 377 417* 407*   Basic Metabolic Panel: Recent Labs  Lab 10/06/23 0305 10/07/23 0235  NA 135 136  K 4.0 4.0  CL 102 102  CO2 21* 25  GLUCOSE 172* 141*  BUN 13 15  CREATININE 0.69 0.73  CALCIUM 8.2* 8.4*  MG  --  2.1  PHOS  --  2.6   GFR: Estimated Creatinine Clearance: 163.3 mL/min (by C-G formula based on SCr of 0.73 mg/dL). Liver Function Tests: Recent Labs  Lab 10/06/23 0305  AST 23  ALT 15  ALKPHOS 77  BILITOT 0.6  PROT 7.2  ALBUMIN 3.7   No results for input(s): "LIPASE", "AMYLASE" in the last 168 hours. No results for input(s): "AMMONIA" in the last 168 hours. Coagulation Profile: Recent Labs  Lab 10/06/23 0305  INR 1.2   Cardiac Enzymes: No results for input(s): "CKTOTAL", "CKMB", "CKMBINDEX", "TROPONINI" in the last 168 hours. BNP (last 3 results) No results for input(s): "PROBNP" in the last 8760 hours. HbA1C: No results for input(s): "HGBA1C" in the last 72 hours. CBG: No results for input(s): "GLUCAP" in the last 168 hours. Lipid Profile: No results for input(s): "CHOL", "HDL", "LDLCALC", "TRIG", "CHOLHDL", "LDLDIRECT" in the last 72 hours. Thyroid Function Tests: Recent Labs    10/06/23 0545  TSH 3.020   Anemia Panel: No results for input(s): "VITAMINB12", "FOLATE", "FERRITIN", "TIBC", "IRON", "RETICCTPCT" in the last 72 hours. Sepsis Labs: No results for input(s): "PROCALCITON", "LATICACIDVEN" in the last 168 hours.  Recent Results (from the past 240 hours)  MRSA Next Gen by PCR, Nasal     Status: None   Collection Time: 10/06/23 12:35 PM   Specimen: Nasal Mucosa; Nasal Swab  Result Value Ref Range Status   MRSA by PCR Next Gen NOT DETECTED NOT DETECTED Final    Comment: (NOTE) The GeneXpert MRSA Assay (FDA approved for NASAL specimens only), is one component of a comprehensive MRSA colonization surveillance program. It is not  intended to diagnose MRSA infection nor to guide or monitor treatment for MRSA infections. Test performance is not FDA approved in patients less than 67 years old. Performed at [facility], [address], [place], [place] [address]          Radiology Studies: No results found.         LOS: 0 days   Time spent= 35 mins    Miguel Rota, MD [facility]  If 7PM-7AM, please contact night-coverage  10/07/2023, 10:16 AM

## 2023-10-07 NOTE — Progress Notes (Signed)
   Patient Name: Cody Hampton Date of Encounter: 10/07/2023 [facility] Cardiologist: None   Interval Summary  .    Stable sx no cp or palpitations, had not had an interval bowel movement.   Vital Signs .    Vitals:   10/07/23 0700 10/07/23 0739 10/07/23 0800 10/07/23 0900  BP: (!) 157/71 (!) 157/71 (!) 167/130 (!) 170/70  Pulse: 69  (!) 101 90  Resp: 14  20 18   Temp:   97.8 F (36.6 C)   TempSrc:   Oral   SpO2: 92%  94% 94%  Weight:      Height:        Intake/Output Summary (Last 24 hours) at 10/07/2023 1031 Last data filed at 10/07/2023 1023 Gross per 24 hour  Intake 508.71 ml  Output 320 ml  Net 188.71 ml      10/06/2023   12:23 PM 10/06/2023    2:46 AM 09/27/2023   10:19 AM  Last 3 Weights  Weight (lbs) 441 lb 2.3 oz 418 lb 14 oz 420 lb  Weight (kg) 200.1 kg 190 kg 190.511 kg      Telemetry/ECG    Aflutter rates 80s-100 - Personally Reviewed  Physical Exam .   GEN: No acute distress.   Neck: unable to assess Cardiac: iRRR Respiratory: Clear to auscultation bilaterally. GI: Soft, nontender, non-distended  MS: puffy edema  Assessment & Plan .     #Atrial flutter RVR #FOBT positive -Normal hemoglobin at this time but patient reports dark and tarry stools, trend CBC per primary service -Patient is currently on IV diltiazem with improvement in rate control.  Patient has refused Lopressor, agreeable to diltiazem orally, titrate dilt to 60 mg Q6H with goal of HR <100 and ability to wean dilt infusion. We may want to continue dilt IV through procedures to control HR for scopes.  - received digoxin IV x 1. Rates are better today, continue current plan.  - Issues with noncompliance at home. -GI to determine plan after eliquis washout.   For questions or updates, please contact [facility] Please consult [email or web address] for contact info under        Signed, Parke Poisson, MD

## 2023-10-07 NOTE — Progress Notes (Signed)
   10/07/23 1338  TOC Brief Assessment  Insurance and Status Reviewed  Patient has primary care physician Yes  Home environment has been reviewed home with spouse  Prior level of function: independent  Prior/Current Home Services No current home services  Social Drivers of Health Review SDOH reviewed no interventions necessary  Readmission risk has been reviewed Yes  Transition of care needs no transition of care needs at this time

## 2023-10-07 NOTE — Plan of Care (Signed)

## 2023-10-08 ENCOUNTER — Encounter: Payer: Self-pay | Admitting: Neurology

## 2023-10-08 DIAGNOSIS — I4892 Unspecified atrial flutter: Secondary | ICD-10-CM | POA: Diagnosis not present

## 2023-10-08 LAB — BASIC METABOLIC PANEL WITH GFR
Anion gap: 8 (ref 5–15)
BUN: 10 mg/dL (ref 8–23)
CO2: 25 mmol/L (ref 22–32)
Calcium: 8.4 mg/dL — ABNORMAL LOW (ref 8.9–10.3)
Chloride: 103 mmol/L (ref 98–111)
Creatinine, Ser: 0.68 mg/dL (ref 0.61–1.24)
GFR, Estimated: >60 mL/min (ref >60)
Glucose, Bld: 134 mg/dL — ABNORMAL HIGH (ref 70–99)
Potassium: 4.5 mmol/L (ref 3.5–5.1)
Sodium: 136 mmol/L (ref 135–145)

## 2023-10-08 LAB — CBC
HCT: 42.8 % (ref 39.0–52.0)
Hemoglobin: 13.3 g/dL (ref 13.0–17.0)
MCH: 29.4 pg (ref 26.0–34.0)
MCHC: 31.1 g/dL (ref 30.0–36.0)
MCV: 94.5 fL (ref 80.0–100.0)
Platelets: 400 10*3/uL (ref 150–400)
RBC: 4.53 MIL/uL (ref 4.22–5.81)
RDW: 14.2 % (ref 11.5–15.5)
WBC: 14.9 10*3/uL — ABNORMAL HIGH (ref 4.0–10.5)
nRBC: 0 % (ref 0.0–0.2)

## 2023-10-08 LAB — MAGNESIUM: Magnesium: 2.2 mg/dL (ref 1.7–2.4)

## 2023-10-08 MED ORDER — DIGOXIN 0.25 MG/ML IJ SOLN
0.2500 mg | Freq: Three times a day (TID) | INTRAMUSCULAR | Status: AC
  Administered 2023-10-08 – 2023-10-09 (×3): 0.25 mg via INTRAVENOUS
  Filled 2023-10-08 (×3): qty 2

## 2023-10-08 MED ORDER — DILTIAZEM HCL 60 MG PO TABS
90.0000 mg | ORAL_TABLET | Freq: Four times a day (QID) | ORAL | Status: DC
  Administered 2023-10-08 – 2023-10-10 (×9): 90 mg via ORAL
  Filled 2023-10-08 (×9): qty 1

## 2023-10-08 MED ORDER — ADULT MULTIVITAMIN LIQUID CH
15.0000 mL | Freq: Every day | ORAL | Status: DC
Start: 2023-10-08 — End: 2023-10-09
  Administered 2023-10-08: 15 mL via ORAL
  Filled 2023-10-08: qty 15

## 2023-10-08 NOTE — Progress Notes (Signed)
 UNASSIGNED PATIENT Subjective: Patient has not had any further bleeding since admission.  He denies having abdominal pain, nausea vomiting, melena, formed bowel movement earlier today.  He is tolerating clear liquids well.  Objective: Vital signs in last 24 hours: Temp:  [97.8 F (36.6 C)-98.6 F (37 C)] 98.6 F (37 C) (03/31 0400) Pulse Rate:  [69-124] 104 (03/31 0300) Resp:  [14-30] 30 (03/31 0300) BP: (122-190)/(36-130) 127/39 (03/31 0300) SpO2:  [91 %-98 %] 93 % (03/31 0300) Last BM Date : 10/05/23  Intake/Output from previous day: 03/30 0701 - 03/31 0700 In: 1303.2 [P.O.:480; I.V.:823.2] Out: 500 [Urine:500] Intake/Output this shift: Total I/O In: 486.9 [I.V.:486.9] Out: -   General appearance: alert, cooperative, fatigued, morbidly obese, and pale Resp: clear to auscultation bilaterally Cardio: regular rate and rhythm, S1, S2 normal, no murmur, click, rub or gallop GI: soft, non-tender; bowel sounds normal; no masses,  no organomegaly  Lab Results: Recent Labs    10/06/23 1856 10/07/23 0235 10/08/23 0236  WBC 14.6* 14.1* 14.9*  HGB 13.7 13.5 13.3  HCT 44.4 42.8 42.8  PLT 417* 407* 400   BMET Recent Labs    10/06/23 0305 10/07/23 0235 10/08/23 0236  NA 135 136 136  K 4.0 4.0 4.5  CL 102 102 103  CO2 21* 25 25  GLUCOSE 172* 141* 134*  BUN 13 15 10   CREATININE 0.69 0.73 0.68  CALCIUM 8.2* 8.4* 8.4*   LFT Recent Labs    10/06/23 0305  PROT 7.2  ALBUMIN 3.7  AST 23  ALT 15  ALKPHOS 77  BILITOT 0.6   PT/INR Recent Labs    10/06/23 0305  LABPROT 15.2  INR 1.2   Studies/Results: No results found.  Medications: I have reviewed the patient's current medications. Prior to Admission:  Medications Prior to Admission  Medication Sig Dispense Refill Last Dose/Taking   apixaban (ELIQUIS) 5 MG TABS tablet Take 1 tablet (5 mg total) by mouth 2 (two) times daily. 60 tablet 3 10/05/2023 at 11:00 PM   citalopram (CELEXA) 20 MG tablet Take 1 tablet by  mouth 2 (two) times daily.   10/05/2023   diltiazem (CARDIZEM CD) 240 MG 24 hr capsule Take 1 capsule (240 mg total) by mouth daily. 30 capsule 3 10/05/2023   loperamide (IMODIUM A-D) 2 MG tablet Take 4 mg by mouth daily.   10/05/2023   Multiple Vitamins-Minerals (BARIATRIC MULTIVITAMINS/IRON) CAPS Take 1 capsule by mouth daily with breakfast.   10/05/2023   oxyCODONE-acetaminophen (PERCOCET/ROXICET) 5-325 MG tablet Take 1 tablet by mouth daily as needed for moderate pain (pain score 4-6) or severe pain (pain score 7-10).   Past Month   pantoprazole (PROTONIX) 40 MG tablet Take 40 mg by mouth as needed (for reflux or GERD symptoms).   Unknown   polycarbophil (FIBERCON) 625 MG tablet Take 625 mg by mouth daily.   10/05/2023   testosterone cypionate (DEPOTESTOSTERONE CYPIONATE) 200 MG/ML injection Inject into the muscle every 14 (fourteen) days.   Past Month   bisoprolol (ZEBETA) 5 MG tablet Take 5 mg by mouth daily. (Patient not taking: Reported on 10/06/2023)   Not Taking   Scheduled:  bariatric multiple vitamin  1 tablet Oral Q breakfast   Chlorhexidine Gluconate Cloth  6 each Topical Daily   citalopram  20 mg Oral BID   diclofenac Sodium  2 g Topical QID   digoxin  0.25 mg Intravenous Q8H   diltiazem  90 mg Oral Q6H   loperamide  4 mg Oral Daily  multivitamin  15 mL Oral Daily   pantoprazole (PROTONIX) IV  40 mg Intravenous Q12H   Continuous:  sodium chloride Stopped (10/08/23 1130)   diltiazem (CARDIZEM) infusion Stopped (10/07/23 1424)   Assessment/Plan: 1) Melenic stools for 3 days prior to admission-currently with no evidence of ongoing rectal bleeding and stable hemoglobin -patient has had a history of gastric bypass he may have an anastomotic ulcer as he is recently restarted on Eliquis plans are to do an EGD once Eliquis washes out. 2) Colonic diverticulosis. 3) GERD-on PPI's. 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 5) atrial flutter with rapid ventricular  response-recently started on Eliquis which is on hold-patient is on a Cardizem drip now. 6) HTN. 7) Depression. 8) Morbid obesity BMI is 60. 9) Severe obstructive sleep apnea.  LOS: 1 day   Charna Elizabeth 10/08/2023, 6:54 AM

## 2023-10-08 NOTE — H&P (View-Only) (Signed)
 UNASSIGNED PATIENT Subjective: Patient has not had any further bleeding since admission.  He denies having abdominal pain, nausea vomiting, melena, formed bowel movement earlier today.  He is tolerating clear liquids well.  Objective: Vital signs in last 24 hours: Temp:  [97.8 F (36.6 C)-98.6 F (37 C)] 98.6 F (37 C) (03/31 0400) Pulse Rate:  [69-124] 104 (03/31 0300) Resp:  [14-30] 30 (03/31 0300) BP: (122-190)/(36-130) 127/39 (03/31 0300) SpO2:  [91 %-98 %] 93 % (03/31 0300) Last BM Date : 10/05/23  Intake/Output from previous day: 03/30 0701 - 03/31 0700 In: 1303.2 [P.O.:480; I.V.:823.2] Out: 500 [Urine:500] Intake/Output this shift: Total I/O In: 486.9 [I.V.:486.9] Out: -   General appearance: alert, cooperative, fatigued, morbidly obese, and pale Resp: clear to auscultation bilaterally Cardio: regular rate and rhythm, S1, S2 normal, no murmur, click, rub or gallop GI: soft, non-tender; bowel sounds normal; no masses,  no organomegaly  Lab Results: Recent Labs    10/06/23 1856 10/07/23 0235 10/08/23 0236  WBC 14.6* 14.1* 14.9*  HGB 13.7 13.5 13.3  HCT 44.4 42.8 42.8  PLT 417* 407* 400   BMET Recent Labs    10/06/23 0305 10/07/23 0235 10/08/23 0236  NA 135 136 136  K 4.0 4.0 4.5  CL 102 102 103  CO2 21* 25 25  GLUCOSE 172* 141* 134*  BUN 13 15 10   CREATININE 0.69 0.73 0.68  CALCIUM 8.2* 8.4* 8.4*   LFT Recent Labs    10/06/23 0305  PROT 7.2  ALBUMIN 3.7  AST 23  ALT 15  ALKPHOS 77  BILITOT 0.6   PT/INR Recent Labs    10/06/23 0305  LABPROT 15.2  INR 1.2   Studies/Results: No results found.  Medications: I have reviewed the patient's current medications. Prior to Admission:  Medications Prior to Admission  Medication Sig Dispense Refill Last Dose/Taking   apixaban (ELIQUIS) 5 MG TABS tablet Take 1 tablet (5 mg total) by mouth 2 (two) times daily. 60 tablet 3 10/05/2023 at 11:00 PM   citalopram (CELEXA) 20 MG tablet Take 1 tablet by  mouth 2 (two) times daily.   10/05/2023   diltiazem (CARDIZEM CD) 240 MG 24 hr capsule Take 1 capsule (240 mg total) by mouth daily. 30 capsule 3 10/05/2023   loperamide (IMODIUM A-D) 2 MG tablet Take 4 mg by mouth daily.   10/05/2023   Multiple Vitamins-Minerals (BARIATRIC MULTIVITAMINS/IRON) CAPS Take 1 capsule by mouth daily with breakfast.   10/05/2023   oxyCODONE-acetaminophen (PERCOCET/ROXICET) 5-325 MG tablet Take 1 tablet by mouth daily as needed for moderate pain (pain score 4-6) or severe pain (pain score 7-10).   Past Month   pantoprazole (PROTONIX) 40 MG tablet Take 40 mg by mouth as needed (for reflux or GERD symptoms).   Unknown   polycarbophil (FIBERCON) 625 MG tablet Take 625 mg by mouth daily.   10/05/2023   testosterone cypionate (DEPOTESTOSTERONE CYPIONATE) 200 MG/ML injection Inject into the muscle every 14 (fourteen) days.   Past Month   bisoprolol (ZEBETA) 5 MG tablet Take 5 mg by mouth daily. (Patient not taking: Reported on 10/06/2023)   Not Taking   Scheduled:  bariatric multiple vitamin  1 tablet Oral Q breakfast   Chlorhexidine Gluconate Cloth  6 each Topical Daily   citalopram  20 mg Oral BID   diclofenac Sodium  2 g Topical QID   digoxin  0.25 mg Intravenous Q8H   diltiazem  90 mg Oral Q6H   loperamide  4 mg Oral Daily  multivitamin  15 mL Oral Daily   pantoprazole (PROTONIX) IV  40 mg Intravenous Q12H   Continuous:  sodium chloride Stopped (10/08/23 1130)   diltiazem (CARDIZEM) infusion Stopped (10/07/23 1424)   Assessment/Plan: 1) Melenic stools for 3 days prior to admission-currently with no evidence of ongoing rectal bleeding and stable hemoglobin -patient has had a history of gastric bypass he may have an anastomotic ulcer as he is recently restarted on Eliquis plans are to do an EGD once Eliquis washes out. 2) Colonic diverticulosis. 3) GERD-on PPI's. 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 5) atrial flutter with rapid ventricular  response-recently started on Eliquis which is on hold-patient is on a Cardizem drip now. 6) HTN. 7) Depression. 8) Morbid obesity BMI is 60. 9) Severe obstructive sleep apnea.  LOS: 1 day   Charna Elizabeth 10/08/2023, 6:54 AM

## 2023-10-08 NOTE — Plan of Care (Signed)

## 2023-10-08 NOTE — Anesthesia Preprocedure Evaluation (Addendum)
 Anesthesia Evaluation  Patient identified by MRN, date of birth, ID band Patient awake    Reviewed: Allergy & Precautions, NPO status , Patient's Chart, lab work & pertinent test results, reviewed documented beta blocker date and time   Airway Mallampati: III  TM Distance: >3 FB Neck ROM: Full    Dental  (+) Poor Dentition, Chipped, Dental Advisory Given, Missing,    Pulmonary sleep apnea and Continuous Positive Airway Pressure Ventilation    Pulmonary exam normal breath sounds clear to auscultation       Cardiovascular hypertension (163/60 preop), Pt. on medications and Pt. on home beta blockers Normal cardiovascular exam+ dysrhythmias (currently in flutter, now rate controlled. on eliquis at home) Atrial Fibrillation  Rhythm:Regular Rate:Normal  Echo 07/2023 1. Left ventricular ejection fraction, by estimation, is 60 to 65%. The  left ventricle has normal function. The left ventricle has no regional  wall motion abnormalities. There is mild asymmetric left ventricular  hypertrophy of the septal segment. Left  ventricular diastolic parameters were normal.   2. Right ventricular systolic function is normal. The right ventricular  size is severely enlarged. There is normal pulmonary artery systolic  pressure.   3. Right atrial size was mildly dilated.   4. The mitral valve is normal in structure. No evidence of mitral valve  regurgitation. No evidence of mitral stenosis.   5. The aortic valve is tricuspid. Aortic valve regurgitation is not  visualized. No aortic stenosis is present.     Neuro/Psych  PSYCHIATRIC DISORDERS Anxiety Depression    negative neurological ROS     GI/Hepatic Neg liver ROS,GERD  Medicated and Controlled,,  Endo/Other    Class 4 obesityBMI 63  Renal/GU   negative genitourinary   Musculoskeletal  (+) Arthritis , Osteoarthritis,    Abdominal  (+) + obese  Peds  Hematology   Anesthesia Other  Findings   Reproductive/Obstetrics negative OB ROS                              Anesthesia Physical Anesthesia Plan  ASA: 4  Anesthesia Plan: MAC   Post-op Pain Management:    Induction: Intravenous  PONV Risk Score and Plan: 1 and Treatment may vary due to age or medical condition and Propofol infusion  Airway Management Planned: Natural Airway and Nasal Cannula  Additional Equipment: None  Intra-op Plan:   Post-operative Plan:   Informed Consent: I have reviewed the patients History and Physical, chart, labs and discussed the procedure including the risks, benefits and alternatives for the proposed anesthesia with the patient or authorized representative who has indicated his/her understanding and acceptance.     Dental advisory given  Plan Discussed with: CRNA  Anesthesia Plan Comments: (Blood in stool)        Anesthesia Quick Evaluation

## 2023-10-08 NOTE — Progress Notes (Addendum)
 Patient Name: Cody Hampton Date of Encounter: 10/08/2023 [facility] Cardiologist: A fib clinic   Interval Summary  .    63 yr old male with PMH of HTN, morbid obesity, OSA, atrial flutter, admitted for melena, GI consulted and planning EGD once Eliquis wash out,  cardiology is following for Aflutter RVR.   Patient states he feels OK. He denied heart palpitation. He had no further melena so far. He states he can't tolerate beta blocker such metoprolol and bisoprolol, felt both are making him feel tired, having stomach issue, etc. He does not want beta blocker.    Vital Signs .    Vitals:   10/08/23 0400 10/08/23 0600 10/08/23 0700 10/08/23 0800  BP:   (!) 182/81 (!) 168/81  Pulse:  (!) 111 100 (!) 109  Resp:  10 18 (!) 27  Temp: 98.6 F (37 C)     TempSrc: Oral     SpO2:  94% 94% 94%  Weight:      Height:        Intake/Output Summary (Last 24 hours) at 10/08/2023 0820 Last data filed at 10/08/2023 0400 Gross per 24 hour  Intake 1072.98 ml  Output 500 ml  Net 572.98 ml      10/06/2023   12:23 PM 10/06/2023    2:46 AM 09/27/2023   10:19 AM  Last 3 Weights  Weight (lbs) 441 lb 2.3 oz 418 lb 14 oz 420 lb  Weight (kg) 200.1 kg 190 kg 190.511 kg      Telemetry/ECG    A fib/flutter RVR 100-110s  - Personally Reviewed  Physical Exam .   GEN: No acute distress.   Neck: Shot and thick neck  Cardiac: Irregularly irregular,  no murmurs, rubs, or gallops.  Respiratory: Clear to auscultation bilaterally. On room air. Speak full sentence  GI: Soft, nontender, non-distended. Obese  MS: No pitting leg edema  Assessment & Plan .     Persistent atrial flutter with RVR - Initially diagnosed 06/2023 after dental procedure, historically intermittent complaint with Eliquis, found in RVR 09/26/23 by PCP office, saw EP 09/27/23 during hospitalization for RVR, felt not a good candidate for ablation or TEE due to super morbid obesity, not a candidate for rhythm control via DCCV or  medication due to inconsistent compliance with Eliquis, recommended rate control with BB ( bisoprolol 5 mg, intolerant to lopressor 75mg  BID due to fatigue) and Eliquis 5mg  BID for anticoagulation.  - now presented to [facility] for melena x3 days, Hgb has remained stable 13, GI planning for EGD after Eliquis wash out given hx of gastric bypass surgery, curse complicated by atrial flutter RVR  - TSH WNL, mild leukocytosis noted seems chronic, Echo from 1/28 showed LVEF 60-65%, no valvular disease  - rate control improved after digoxin 0.5mg  x1 and diltiazem gtt (ventricular rate of 150 >>110s), started on diltiazem 90mg  TID currently, intolerant to BB in the past and refuse add back bisoprolol, on PTA diltiazem CD 240mg  and bisoprolol 5mg , can max out diltiazem for rate control, add digoxin if needed  - holding Eliquis until further clearance by GI    GI bleed Obesity  OSA HTN Leukocytosis  Anxiety/depression MGUS - per primary team      For questions or updates, please contact [facility] Please consult [email or web address] for contact info under        Signed, Cyndi Bender, NP   Patient seen and examined with Cyndi Bender NP.  Agree as above, with  the following exceptions and changes as noted below. Rates intermittently elevated, will load with digoxin for now but this will be most helpful for nonambulatory rate control. Diltiazem also titrated to 90 mg TID, now off of dilt infusion.. Gen: NAD, CV: iRRR, no murmurs, Lungs: clear, Abd: soft, Extrem: Warm, well perfused, puffy edema, Neuro/Psych: alert and oriented x 3, normal mood and affect. All available labs, radiology testing, previous records reviewed.  Medication therapy as noted, continue diltiazem which has been uptitrated, and loaded with digoxin to facilitate better rate control during procedure with endoscopy tomorrow, discussed with Dr. Loreta Ave.  Pending results of endoscopy, we will consider resuming Eliquis.  Not currently a candidate for  TEE cardioversion given interruptions in anticoagulation and concern for bleeding.  Parke Poisson, MD 10/08/23 3:21 PM

## 2023-10-08 NOTE — Progress Notes (Signed)
 PROGRESS NOTE    Cody Hampton  IHK:[id number] DOB: [birth year] DOA: 10/06/2023 PCP: Mattie Marlin, DO    Brief Narrative:   63 year old history of A-fib on Eliquis started about 10 days ago, sleep apnea, gastric bypass, MGUS comes to the ER with 3 days of dark tarry stools.  In the ER noted to be in A-fib with RVR started on Cardizem drip, hemoglobin 13.9, Hemoccult positive.  GI consulted.  Assessment & Plan:  Principal Problem:   Atrial flutter with rapid ventricular response (HCC) Active Problems:   Anxiety and depression   MGUS (monoclonal gammopathy of unknown significance)   OSA (obstructive sleep apnea)   S/P bariatric surgery   Atrial flutter (HCC)   GI bleeding   Acute GI bleed - Suspect in the setting of starting Eliquis recently for atrial fibrillation.  [facility] 2021 per patient was in 2021-normal.  Colonoscopy 2021 incomplete due to redundant colon. -Baseline hemoglobin 14, remained stable.  GI following, continue PPI twice daily. - Eliquis washout thereafter endoscopy  Atrial Flutter/fib with RVR Essential hypertension - Currently on Cardizem drip along with p.o. uptitrate Cardizem.  Likely will require second AV nodal blocker-digoxin multiple IV as needed doses given including digoxin - Cardiology following - TSH-normal - Echo January 2025-preserved EF, enlarged RV - IV as needed  Leukocytosis - Looking at previous trend appears to be chronic.  No signs of infection  Anxiety/depression - Celexa  Sleep apnea - Bedtime CPAP if necessary  Chronic pain - As needed meds  MGUS - Followed by outpatient oncology  Status post bariatric surgery - On B12 supplements   DVT prophylaxis: SCDs Start: 10/06/23 0544    Code Status: Full Code Family Communication: Son at bedside Controlling A-fib and ongoing evaluation for GI bleed    Subjective:  No complaints but with minimal mobility his heart rate jumps up to almost 140s.  Examination:  General  exam: Appears calm and comfortable, morbid obesity Respiratory system: Clear to auscultation. Respiratory effort normal. Cardiovascular system: Tachycardia, irregularly irregular Gastrointestinal system: Abdomen is nondistended, soft and nontender. No organomegaly or masses felt. Normal bowel sounds heard. Central nervous system: Alert and oriented. No focal neurological deficits. Extremities: Symmetric 5 x 5 power. Skin: No rashes, lesions or ulcers Psychiatry: Judgement and insight appear normal. Mood & affect appropriate.                Diet Orders (From admission, onward)     Start     Ordered   10/09/23 0001  Diet NPO time specified Except for: Sips with Meds  Diet effective midnight       Question:  Except for  Answer:  Sips with Meds   10/08/23 0932   10/06/23 1003  Diet clear liquid Room service appropriate? Yes; Fluid consistency: Thin  Diet effective now       Question Answer Comment  Room service appropriate? Yes   Fluid consistency: Thin      10/06/23 1002            Objective: Vitals:   10/08/23 0700 10/08/23 0800 10/08/23 0900 10/08/23 1000  BP: (!) 182/81 (!) 168/81  96/64  Pulse: 100 (!) 109 (!) 125 (!) 112  Resp: 18 (!) 27 14 (!) 24  Temp:  98.4 F (36.9 C)    TempSrc:  Oral    SpO2: 94% 94% 96% 95%  Weight:      Height:        Intake/Output Summary (Last 24 hours)  at 10/08/2023 1159 Last data filed at 10/08/2023 1000 Gross per 24 hour  Intake 1104.51 ml  Output 500 ml  Net 604.51 ml   Filed Weights   10/06/23 0246 10/06/23 1223  Weight: (!) 190 kg (!) 200.1 kg    Scheduled Meds:  bariatric multiple vitamin  1 tablet Oral Q breakfast   Chlorhexidine Gluconate Cloth  6 each Topical Daily   citalopram  20 mg Oral BID   diclofenac Sodium  2 g Topical QID   diltiazem  90 mg Oral Q6H   loperamide  4 mg Oral Daily   multivitamin  15 mL Oral Daily   pantoprazole (PROTONIX) IV  40 mg Intravenous Q12H   Continuous Infusions:  sodium  chloride 50 mL/hr at 10/08/23 1000   diltiazem (CARDIZEM) infusion Stopped (10/07/23 1424)    Nutritional status     Body mass index is 63.3 kg/m.  Data Reviewed:   CBC: Recent Labs  Lab 10/06/23 0305 10/06/23 0543 10/06/23 1856 10/07/23 0235 10/08/23 0236  WBC 17.7* 15.1* 14.6* 14.1* 14.9*  HGB 13.9 13.1 13.7 13.5 13.3  HCT 44.5 40.7 44.4 42.8 42.8  MCV 91.8 91.7 94.3 92.4 94.5  PLT 446* 377 417* 407* 400   Basic Metabolic Panel: Recent Labs  Lab 10/06/23 0305 10/07/23 0235 10/08/23 0236  NA 135 136 136  K 4.0 4.0 4.5  CL 102 102 103  CO2 21* 25 25  GLUCOSE 172* 141* 134*  BUN 13 15 10   CREATININE 0.69 0.73 0.68  CALCIUM 8.2* 8.4* 8.4*  MG  --  2.1 2.2  PHOS  --  2.6  --    GFR: Estimated Creatinine Clearance: 163.3 mL/min (by C-G formula based on SCr of 0.68 mg/dL). Liver Function Tests: Recent Labs  Lab 10/06/23 0305  AST 23  ALT 15  ALKPHOS 77  BILITOT 0.6  PROT 7.2  ALBUMIN 3.7   No results for input(s): "LIPASE", "AMYLASE" in the last 168 hours. No results for input(s): "AMMONIA" in the last 168 hours. Coagulation Profile: Recent Labs  Lab 10/06/23 0305  INR 1.2   Cardiac Enzymes: No results for input(s): "CKTOTAL", "CKMB", "CKMBINDEX", "TROPONINI" in the last 168 hours. BNP (last 3 results) No results for input(s): "PROBNP" in the last 8760 hours. HbA1C: No results for input(s): "HGBA1C" in the last 72 hours. CBG: No results for input(s): "GLUCAP" in the last 168 hours. Lipid Profile: No results for input(s): "CHOL", "HDL", "LDLCALC", "TRIG", "CHOLHDL", "LDLDIRECT" in the last 72 hours. Thyroid Function Tests: Recent Labs    10/06/23 0545  TSH 3.020   Anemia Panel: No results for input(s): "VITAMINB12", "FOLATE", "FERRITIN", "TIBC", "IRON", "RETICCTPCT" in the last 72 hours. Sepsis Labs: No results for input(s): "PROCALCITON", "LATICACIDVEN" in the last 168 hours.  Recent Results (from the past 240 hours)  MRSA Next Gen by  PCR, Nasal     Status: None   Collection Time: 10/06/23 12:35 PM   Specimen: Nasal Mucosa; Nasal Swab  Result Value Ref Range Status   MRSA by PCR Next Gen NOT DETECTED NOT DETECTED Final    Comment: (NOTE) The GeneXpert MRSA Assay (FDA approved for NASAL specimens only), is one component of a comprehensive MRSA colonization surveillance program. It is not intended to diagnose MRSA infection nor to guide or monitor treatment for MRSA infections. Test performance is not FDA approved in patients less than 72 years old. Performed at [facility], [address] [place], [place] [address]  Radiology Studies: No results found.         LOS: 1 day   Time spent= 35 mins    Miguel Rota, MD [facility]  If 7PM-7AM, please contact night-coverage  10/08/2023, 11:59 AM

## 2023-10-09 ENCOUNTER — Encounter (HOSPITAL_COMMUNITY)
Admission: EM | Disposition: A | Discharge status: Home / Self Care | Payer: Self-pay | Source: Home / Self Care | Attending: Internal Medicine

## 2023-10-09 ENCOUNTER — Inpatient Hospital Stay (HOSPITAL_COMMUNITY): Payer: Self-pay | Admitting: Anesthesiology

## 2023-10-09 ENCOUNTER — Encounter (HOSPITAL_COMMUNITY): Payer: Self-pay | Admitting: Internal Medicine

## 2023-10-09 ENCOUNTER — Encounter (HOSPITAL_COMMUNITY): Admitting: Internal Medicine

## 2023-10-09 ENCOUNTER — Encounter (HOSPITAL_COMMUNITY): Payer: Self-pay

## 2023-10-09 DIAGNOSIS — K921 Melena: Secondary | ICD-10-CM | POA: Diagnosis not present

## 2023-10-09 DIAGNOSIS — F418 Other specified anxiety disorders: Secondary | ICD-10-CM | POA: Diagnosis not present

## 2023-10-09 DIAGNOSIS — I4892 Unspecified atrial flutter: Secondary | ICD-10-CM | POA: Diagnosis not present

## 2023-10-09 DIAGNOSIS — I10 Essential (primary) hypertension: Secondary | ICD-10-CM | POA: Diagnosis not present

## 2023-10-09 DIAGNOSIS — G4733 Obstructive sleep apnea (adult) (pediatric): Secondary | ICD-10-CM

## 2023-10-09 HISTORY — PX: ESOPHAGOGASTRODUODENOSCOPY: SHX5428

## 2023-10-09 LAB — CBC
HCT: 41.3 % (ref 39.0–52.0)
Hemoglobin: 12.7 g/dL — ABNORMAL LOW (ref 13.0–17.0)
MCH: 29.5 pg (ref 26.0–34.0)
MCHC: 30.8 g/dL (ref 30.0–36.0)
MCV: 95.8 fL (ref 80.0–100.0)
Platelets: 353 10*3/uL (ref 150–400)
RBC: 4.31 MIL/uL (ref 4.22–5.81)
RDW: 14.1 % (ref 11.5–15.5)
WBC: 11.4 10*3/uL — ABNORMAL HIGH (ref 4.0–10.5)
nRBC: 0 % (ref 0.0–0.2)

## 2023-10-09 LAB — BASIC METABOLIC PANEL WITH GFR
Anion gap: 7 (ref 5–15)
BUN: 7 mg/dL — ABNORMAL LOW (ref 8–23)
CO2: 22 mmol/L (ref 22–32)
Calcium: 8.1 mg/dL — ABNORMAL LOW (ref 8.9–10.3)
Chloride: 106 mmol/L (ref 98–111)
Creatinine, Ser: 0.63 mg/dL (ref 0.61–1.24)
GFR, Estimated: >60 mL/min (ref >60)
Glucose, Bld: 116 mg/dL — ABNORMAL HIGH (ref 70–99)
Potassium: 3.9 mmol/L (ref 3.5–5.1)
Sodium: 135 mmol/L (ref 135–145)

## 2023-10-09 LAB — MAGNESIUM: Magnesium: 2 mg/dL (ref 1.7–2.4)

## 2023-10-09 SURGERY — EGD (ESOPHAGOGASTRODUODENOSCOPY)
Anesthesia: Monitor Anesthesia Care | Laterality: Left

## 2023-10-09 MED ORDER — STERILE WATER FOR INJECTION IJ SOLN
INTRAMUSCULAR | Status: AC
  Administered 2023-10-09: 10 mL
  Filled 2023-10-09: qty 10

## 2023-10-09 MED ORDER — DILTIAZEM HCL 25 MG/5ML IV SOLN: 5.0000 mg | INTRAVENOUS | Status: DC | PRN

## 2023-10-09 MED ORDER — LIDOCAINE 2% (20 MG/ML) 5 ML SYRINGE
INTRAMUSCULAR | Status: DC | PRN
  Administered 2023-10-09: 100 mg via INTRAVENOUS

## 2023-10-09 MED ORDER — PROPOFOL 500 MG/50ML IV EMUL
INTRAVENOUS | Status: DC | PRN
  Administered 2023-10-09: 125 ug/kg/min via INTRAVENOUS

## 2023-10-09 MED ORDER — PROPOFOL 1000 MG/100ML IV EMUL
INTRAVENOUS | Status: AC
  Filled 2023-10-09: qty 100

## 2023-10-09 MED ORDER — SODIUM CHLORIDE 0.9 % IV SOLN: INTRAVENOUS | Status: DC | PRN

## 2023-10-09 MED ORDER — PROPOFOL 10 MG/ML IV BOLUS
INTRAVENOUS | Status: DC | PRN
  Administered 2023-10-09 (×3): 20 mg via INTRAVENOUS

## 2023-10-09 MED ORDER — APIXABAN 5 MG PO TABS
5.0000 mg | ORAL_TABLET | Freq: Two times a day (BID) | ORAL | Status: DC
Start: 2023-10-09 — End: 2023-10-10
  Administered 2023-10-09 – 2023-10-10 (×2): 5 mg via ORAL
  Filled 2023-10-09 (×2): qty 1

## 2023-10-09 NOTE — Interval H&P Note (Signed)
 History and Physical Interval Note:  10/09/2023 12:51 PM  Cody Hampton  has presented today for surgery, with the diagnosis of blood in stool.  The various methods of treatment have been discussed with the patient and family. After consideration of risks, benefits and other options for treatment, the patient has consented to  Procedure(s) with comments: EGD (ESOPHAGOGASTRODUODENOSCOPY) (Left) - blood in stool as a surgical intervention.  The patient's history has been reviewed, patient examined, no change in status, stable for surgery.  I have reviewed the patient's chart and labs.  Questions were answered to the patient's satisfaction.     Quintasha Gren D

## 2023-10-09 NOTE — Progress Notes (Signed)
 PHARMACY - ANTICOAGULATION CONSULT NOTE  Pharmacy Consult for apixaban Indication: History of atrial fibrillation  Allergies  Allergen Reactions   Metoprolol Anxiety, Hypertension, Nausea Only, Other (See Comments), Photosensitivity and Shortness Of Breath   Duloxetine Other (See Comments)    Made mood worse.   Gabapentin Other (See Comments)    "Not tolerated well."   Hydrocodone Other (See Comments)    "Made me feel odd in my head."    Patient Measurements: Height: 5\' 10"  (177.8 cm) Weight: (!) 200.1 kg (441 lb 2.3 oz) IBW/kg (Calculated) : 73 HEPARIN DW (KG): 123.9  Vital Signs: Temp: 97.7 F (36.5 C) (04/01 1319) Temp Source: Temporal (04/01 1319) BP: 141/66 (04/01 1421) Pulse Rate: 85 (04/01 1433)  Labs: Recent Labs    10/07/23 0235 10/08/23 0236 10/09/23 0254  HGB 13.5 13.3 12.7*  HCT 42.8 42.8 41.3  PLT 407* 400 353  CREATININE 0.73 0.68 0.63    Estimated Creatinine Clearance: 163.3 mL/min (by C-G formula based on SCr of 0.63 mg/dL).   Medical History: Past Medical History:  Diagnosis Date   Arthritis    in knees   Depression    Hypertension    Obesity    Severe sleep apnea     Medications:  Medications Prior to Admission  Medication Sig Dispense Refill Last Dose/Taking   apixaban (ELIQUIS) 5 MG TABS tablet Take 1 tablet (5 mg total) by mouth 2 (two) times daily. 60 tablet 3 10/05/2023 at 11:00 PM   citalopram (CELEXA) 20 MG tablet Take 1 tablet by mouth 2 (two) times daily.   10/05/2023   diltiazem (CARDIZEM CD) 240 MG 24 hr capsule Take 1 capsule (240 mg total) by mouth daily. 30 capsule 3 10/05/2023   loperamide (IMODIUM A-D) 2 MG tablet Take 4 mg by mouth daily.   10/05/2023   Multiple Vitamins-Minerals (BARIATRIC MULTIVITAMINS/IRON) CAPS Take 1 capsule by mouth daily with breakfast.   10/05/2023   oxyCODONE-acetaminophen (PERCOCET/ROXICET) 5-325 MG tablet Take 1 tablet by mouth daily as needed for moderate pain (pain score 4-6) or severe pain  (pain score 7-10).   Past Month   pantoprazole (PROTONIX) 40 MG tablet Take 40 mg by mouth as needed (for reflux or GERD symptoms).   Unknown   polycarbophil (FIBERCON) 625 MG tablet Take 625 mg by mouth daily.   10/05/2023   testosterone cypionate (DEPOTESTOSTERONE CYPIONATE) 200 MG/ML injection Inject into the muscle every 14 (fourteen) days.   Past Month   bisoprolol (ZEBETA) 5 MG tablet Take 5 mg by mouth daily. (Patient not taking: Reported on 10/06/2023)   Not Taking    Assessment: Pharmacy consulted to dose apixaban for this 63 yo male with history of atrial fibrillation and on apixaban 5 mg po BID prior to admission.  Apixaban held for EGD which was completed today.  Per Gastroenterology, patient has not had any further bleeding since admission. He denies having abdominal pain, nausea vomiting, melena, formed bowel movement earlier today. He is tolerating clear liquids well prior to EGD. EGD ended 4/1 1307.    Age <80 yrs, Wt >60 kg, SCr < 1.5 meeting criteria for apixaban 5 mg BID dosing.   Goal of Therapy:  Reduce risk of stroke and systemic embolism Monitor platelets by anticoagulation protocol: Yes   Plan:  Apixaban 5 mg po BID With no dose adjustments anticipated, Pharmacy will sign off on consult and continue to monitor CBC as ordered by provider along with any signs/symptoms of bleeding.    Thank you  for allowing pharmacy to be a part of this patient's care.  Selinda Eon, PharmD, BCPS Clinical Pharmacist [place] 10/09/2023 3:26 PM

## 2023-10-09 NOTE — Anesthesia Procedure Notes (Signed)
 Date/Time: 10/09/2023 1:12 PM  Performed by: Florene Route, CRNAOxygen Delivery Method: Simple face mask

## 2023-10-09 NOTE — Progress Notes (Signed)
 PROGRESS NOTE    Cody Hampton  UJW:[id number] DOB: 22-Apr-1961 DOA: 10/06/2023 PCP: Mattie Marlin, DO    Brief Narrative:   63 year old history of A-fib on Eliquis started about 10 days ago, sleep apnea, gastric bypass, MGUS comes to the ER with 3 days of dark tarry stools.  In the ER noted to be in A-fib with RVR started on Cardizem drip, hemoglobin 13.9, Hemoccult positive.  GI following, planning on endoscopy after Eliquis washout.  For uncontrolled atrial fibrillation with RVR initially patient was started on Cardizem drip and slowly p.o. Cardizem and digoxin was introduced.  Assessment & Plan:  Principal Problem:   Atrial flutter with rapid ventricular response (HCC) Active Problems:   Anxiety and depression   MGUS (monoclonal gammopathy of unknown significance)   OSA (obstructive sleep apnea)   S/P bariatric surgery   Atrial flutter (HCC)   GI bleeding   Acute GI bleed - Suspect in the setting of starting Eliquis recently for atrial fibrillation.  [facility] 2021 per patient was in 2021-normal.  Colonoscopy 2021 incomplete due to redundant colon. -Baseline hemoglobin 14, remained stable.  GI following, continue PPI twice daily. - Eliquis washout thereafter endoscopy, timing per GI  Atrial Flutter/fib with RVR Essential hypertension - Heart rate is better controlled now on p.o. Cardizem and digoxin.  Still remains in a flutter but for now acceptable as long as his rate is controlled.  Further plans for any sort of cardioversion will be deferred to cardiology team - TSH-normal - Echo January 2025-preserved EF, enlarged RV - IV as needed  Leukocytosis - Looking at previous trend appears to be chronic.  No signs of infection  Anxiety/depression - Celexa  Sleep apnea - Bedtime CPAP if necessary  Chronic pain - As needed meds  MGUS - Followed by outpatient oncology  Status post bariatric surgery - On B12 supplements   DVT prophylaxis: SCDs Start: 10/06/23  0544    Code Status: Full Code Family Communication: Son at bedside Controlling A-fib and ongoing evaluation for GI bleed    Subjective: Seen at bedside no complaints.  Sitting up in the chair.  Heart rate appears to be much better today.   Examination:  General exam: Appears calm and comfortable, morbid obesity Respiratory system: Clear to auscultation. Respiratory effort normal. Cardiovascular system: Tachycardia, irregularly irregular Gastrointestinal system: Abdomen is nondistended, soft and nontender. No organomegaly or masses felt. Normal bowel sounds heard. Central nervous system: Alert and oriented. No focal neurological deficits. Extremities: Symmetric 5 x 5 power. Skin: No rashes, lesions or ulcers Psychiatry: Judgement and insight appear normal. Mood & affect appropriate.                Diet Orders (From admission, onward)     Start     Ordered   10/09/23 0840  Diet NPO time specified  Diet effective now        10/09/23 0839            Objective: Vitals:   10/09/23 0700 10/09/23 0800 10/09/23 1000 10/09/23 1203  BP: (!) 113/99 (!) 117/54 (!) 138/47 (!) 163/60  Pulse: 80 66 72 (!) 102  Resp: 13 18 18 13   Temp:  97.9 F (36.6 C)    TempSrc:  Axillary  Temporal  SpO2: 95% 96% 96% 98%  Weight:      Height:        Intake/Output Summary (Last 24 hours) at 10/09/2023 1246 Last data filed at 10/09/2023 1100 Gross per 24  hour  Intake --  Output 1500 ml  Net -1500 ml   Filed Weights   10/06/23 0246 10/06/23 1223  Weight: (!) 190 kg (!) 200.1 kg    Scheduled Meds:  bariatric multiple vitamin  1 tablet Oral Q breakfast   Chlorhexidine Gluconate Cloth  6 each Topical Daily   citalopram  20 mg Oral BID   diclofenac Sodium  2 g Topical QID   diltiazem  90 mg Oral Q6H   loperamide  4 mg Oral Daily   pantoprazole (PROTONIX) IV  40 mg Intravenous Q12H   Continuous Infusions:  Nutritional status     Body mass index is 63.3 kg/m.  Data  Reviewed:   CBC: Recent Labs  Lab 10/06/23 0543 10/06/23 1856 10/07/23 0235 10/08/23 0236 10/09/23 0254  WBC 15.1* 14.6* 14.1* 14.9* 11.4*  HGB 13.1 13.7 13.5 13.3 12.7*  HCT 40.7 44.4 42.8 42.8 41.3  MCV 91.7 94.3 92.4 94.5 95.8  PLT 377 417* 407* 400 353   Basic Metabolic Panel: Recent Labs  Lab 10/06/23 0305 10/07/23 0235 10/08/23 0236 10/09/23 0254  NA 135 136 136 135  K 4.0 4.0 4.5 3.9  CL 102 102 103 106  CO2 21* 25 25 22   GLUCOSE 172* 141* 134* 116*  BUN 13 15 10  7*  CREATININE 0.69 0.73 0.68 0.63  CALCIUM 8.2* 8.4* 8.4* 8.1*  MG  --  2.1 2.2 2.0  PHOS  --  2.6  --   --    GFR: Estimated Creatinine Clearance: 163.3 mL/min (by C-G formula based on SCr of 0.63 mg/dL). Liver Function Tests: Recent Labs  Lab 10/06/23 0305  AST 23  ALT 15  ALKPHOS 77  BILITOT 0.6  PROT 7.2  ALBUMIN 3.7   No results for input(s): "LIPASE", "AMYLASE" in the last 168 hours. No results for input(s): "AMMONIA" in the last 168 hours. Coagulation Profile: Recent Labs  Lab 10/06/23 0305  INR 1.2   Cardiac Enzymes: No results for input(s): "CKTOTAL", "CKMB", "CKMBINDEX", "TROPONINI" in the last 168 hours. BNP (last 3 results) No results for input(s): "PROBNP" in the last 8760 hours. HbA1C: No results for input(s): "HGBA1C" in the last 72 hours. CBG: No results for input(s): "GLUCAP" in the last 168 hours. Lipid Profile: No results for input(s): "CHOL", "HDL", "LDLCALC", "TRIG", "CHOLHDL", "LDLDIRECT" in the last 72 hours. Thyroid Function Tests: No results for input(s): "TSH", "T4TOTAL", "FREET4", "T3FREE", "THYROIDAB" in the last 72 hours. Anemia Panel: No results for input(s): "VITAMINB12", "FOLATE", "FERRITIN", "TIBC", "IRON", "RETICCTPCT" in the last 72 hours. Sepsis Labs: No results for input(s): "PROCALCITON", "LATICACIDVEN" in the last 168 hours.  Recent Results (from the past 240 hours)  MRSA Next Gen by PCR, Nasal     Status: None   Collection Time: 10/06/23  12:35 PM   Specimen: Nasal Mucosa; Nasal Swab  Result Value Ref Range Status   MRSA by PCR Next Gen NOT DETECTED NOT DETECTED Final    Comment: (NOTE) The GeneXpert MRSA Assay (FDA approved for NASAL specimens only), is one component of a comprehensive MRSA colonization surveillance program. It is not intended to diagnose MRSA infection nor to guide or monitor treatment for MRSA infections. Test performance is not FDA approved in patients less than 34 years old. Performed at [facility], [address], [place], [place] [address]          Radiology Studies: No results found.         LOS: 2 days  Time spent= 35 mins    Miguel Rota, MD [facility]  If 7PM-7AM, please contact night-coverage  10/09/2023, 12:46 PM

## 2023-10-09 NOTE — Anesthesia Postprocedure Evaluation (Signed)
 Anesthesia Post Note  Patient: Cody Hampton  Procedure(s) Performed: EGD (ESOPHAGOGASTRODUODENOSCOPY) (Left)     Patient location during evaluation: PACU Anesthesia Type: MAC Level of consciousness: awake and alert Pain management: pain level controlled Vital Signs Assessment: post-procedure vital signs reviewed and stable Respiratory status: spontaneous breathing, nonlabored ventilation and respiratory function stable Cardiovascular status: blood pressure returned to baseline and stable Postop Assessment: no apparent nausea or vomiting Anesthetic complications: no   No notable events documented.  Last Vitals:  Vitals:   10/09/23 1320 10/09/23 1330  BP: 123/70 (!) 147/88  Pulse: 88 86  Resp: 14 15  Temp:    SpO2: 100% 99%    Last Pain:  Vitals:   10/09/23 1330  TempSrc:   PainSc: 0-No pain                 Lannie Fields

## 2023-10-09 NOTE — Progress Notes (Signed)
 PT Cancellation Note  Patient Details Name: Cody Hampton MRN: [id number] DOB: January 30, 1961   Cancelled Treatment:    Reason Eval/Treat Not Completed: Patient at procedure or test/unavailable  Scheduled for EGD. .Will check back another  time.  Blanchard Kelch PT Acute Rehabilitation Services Office [phone]    Rada Hay 10/09/2023, 11:47 AM

## 2023-10-09 NOTE — Transfer of Care (Signed)
 Immediate Anesthesia Transfer of Care Note  Patient: Cody Hampton  Procedure(s) Performed: EGD (ESOPHAGOGASTRODUODENOSCOPY) (Left)  Patient Location: Endoscopy Unit  Anesthesia Type:MAC  Level of Consciousness: drowsy  Airway & Oxygen Therapy: Patient Spontanous Breathing and Patient connected to face mask oxygen  Post-op Assessment: Report given to RN and Post -op Vital signs reviewed and stable  Post vital signs: Reviewed and stable  Last Vitals:  Vitals Value Taken Time  BP    Temp    Pulse 89 10/09/23 1318  Resp    SpO2 100 % 10/09/23 1318  Vitals shown include unfiled device data.  Last Pain:  Vitals:   10/09/23 1203  TempSrc: Temporal  PainSc: 0-No pain         Complications: No notable events documented.

## 2023-10-09 NOTE — Progress Notes (Signed)
   Patient Name: Cody Hampton Date of Encounter: 10/09/2023 [facility] Cardiologist: [facility] fib clinic   Interval Summary  .    63 yr old male with PMH of HTN, morbid obesity, OSA, atrial flutter, admitted for melena, GI consulted and planning EGD once Eliquis wash out,  cardiology is following for Aflutter RVR.   Stable sx. EGD planned today. Rates slow overnight with digoxin loading. No additional digoxin today.   Vital Signs .    Vitals:   10/09/23 0400 10/09/23 0500 10/09/23 0700 10/09/23 0800  BP: (!) 132/45 (!) 109/44 (!) 113/99 (!) 117/54  Pulse: 73 72 80 66  Resp: 17 10 13 18   Temp:    97.9 F (36.6 C)  TempSrc:    Axillary  SpO2: 95% (!) 83% 95% 96%  Weight:      Height:        Intake/Output Summary (Last 24 hours) at 10/09/2023 0849 Last data filed at 10/09/2023 0800 Gross per 24 hour  Intake 295.49 ml  Output 1200 ml  Net -904.51 ml      10/06/2023   12:23 PM 10/06/2023    2:46 AM 09/27/2023   10:19 AM  Last 3 Weights  Weight (lbs) 441 lb 2.3 oz 418 lb 14 oz 420 lb  Weight (kg) 200.1 kg 190 kg 190.511 kg      Telemetry/ECG    Aflutter 50-70s  - Personally Reviewed  Physical Exam .   GEN: No acute distress.   Neck: unable to assess jvp Cardiac: Irregularly irregular,  no murmurs, rubs, or gallops.  Respiratory: Clear to auscultation bilaterally. On room air. Speak full sentence  GI: Soft, nontender, non-distended. Obese  MS: No pitting leg edema  Assessment & Plan .     Persistent atrial flutter with RVR - Initially diagnosed 06/2023 after dental procedure, historically intermittent complaint with Eliquis, found in RVR 09/26/23 by PCP office, saw EP 09/27/23 during hospitalization for RVR, felt not a good candidate for ablation or TEE due to super morbid obesity, not a candidate for rhythm control via DCCV or medication due to inconsistent compliance with Eliquis, recommended rate control with BB ( bisoprolol 5 mg, intolerant to lopressor 75mg  BID due to  fatigue) and Eliquis 5mg  BID for anticoagulation.  - now presented to [facility] for melena x3 days, Hgb has remained stable 12.7 today down half gram, GI planning for EGD today. - TSH WNL, mild leukocytosis noted seems chronic, Echo from 1/28 showed LVEF 60-65%, no valvular disease  - rate control improved after digoxin loading and diltiazem 90 mg po TID. Now with slow ventricular rates. Ok to continue diltiazem would hold further digoxin. Will consolidate to 120 mg cardizem long acting BID per patient preference when GI procedure complete. - holding Eliquis until further clearance by GI    GI bleed Obesity  OSA HTN Leukocytosis  Anxiety/depression MGUS - per primary team      For questions or updates, please contact [facility] Please consult [email or web address] for contact info under        Signed, Parke Poisson, MD  10/09/23 8:49 AM

## 2023-10-09 NOTE — Op Note (Signed)
 [facility] Patient Name: Cody Hampton Procedure Date: 10/09/2023 MRN: [id number] Attending MD: Jeani Hawking , MD, [id number] Date of Birth: October 05, 1960 CSN: [id number] Age: 63 Admit Type: Outpatient Procedure:                Upper GI endoscopy Indications:              Melena Providers:                Jeani Hawking, MD, Suzy Bouchard, RN, Salley Scarlet,                            Technician, Leroy Libman, CRNA Referring MD:              Medicines:                Propofol per Anesthesia Complications:            No immediate complications. Estimated Blood Loss:     Estimated blood loss: none. Procedure:                Pre-Anesthesia Assessment:                           - Prior to the procedure, a History and Physical                            was performed, and patient medications and                            allergies were reviewed. The patient's tolerance of                            previous anesthesia was also reviewed. The risks                            and benefits of the procedure and the sedation                            options and risks were discussed with the patient.                            All questions were answered, and informed consent                            was obtained. Prior Anticoagulants: The patient has                            taken Eliquis (apixaban), last dose was 2 days                            prior to procedure. ASA Grade Assessment: IV - A                            patient with severe systemic disease that is a  constant threat to life. After reviewing the risks                            and benefits, the patient was deemed in                            satisfactory condition to undergo the procedure.                           - Sedation was administered by an anesthesia                            professional. Deep sedation was attained.                           After obtaining informed  consent, the endoscope was                            passed under direct vision. Throughout the                            procedure, the patient's blood pressure, pulse, and                            oxygen saturations were monitored continuously. The                            [id number]-[id number] ([id number]) Olympus endoscope was introduced                            through the mouth, and advanced to the second part                            of duodenum. The upper GI endoscopy was                            accomplished without difficulty. The patient                            tolerated the procedure well. Scope In: Scope Out: Findings:      The esophagus was normal.      Evidence of a gastric bypass was found in the gastric fundus. This was       characterized by healthy appearing mucosa.      The examined jejunum was normal.      The patient had some type of bariatric surgery. The gastric pouch was 5       cm. Only an efferent limb was identified. The anastomosis appeared       intact and there was no evidence of any ulcerations or erosions. Impression:               - Normal esophagus.                           - A gastric bypass was found, characterized by  healthy appearing mucosa.                           - Normal examined jejunum.                           - No specimens collected. Moderate Sedation:      Not Applicable - Patient had care per Anesthesia. Recommendation:           - Return patient to hospital ward for ongoing care.                           - Resume regular diet.                           - Continue present medications.                           - Ok to resume Eliquis.                           - If melenic stools recur, then a bleeding scan is                            recommended.                           - Follow HGB.                           - With his severe and tenuous cardiac state, a                            colonoscopy is not  recommended. Procedure Code(s):        --- Professional ---                           [phone], Esophagogastroduodenoscopy, flexible,                            transoral; diagnostic, including collection of                            specimen(s) by brushing or washing, when performed                            (separate procedure) Diagnosis Code(s):        --- Professional ---                           U04.54, Bariatric surgery status                           K92.1, Melena (includes Hematochezia) CPT copyright 2022 American Medical Association. All rights reserved. The codes documented in this report are preliminary and upon coder review may  be revised to meet current compliance requirements. Jeani Hawking, MD Jeani Hawking, MD 10/09/2023 1:29:02 PM This report has been  signed electronically. Number of Addenda: 0

## 2023-10-09 NOTE — Anesthesia Procedure Notes (Signed)
 Date/Time: 10/09/2023 12:57 PM  Performed by: Florene Route, CRNAOxygen Delivery Method: Supernova nasal CPAP

## 2023-10-10 ENCOUNTER — Other Ambulatory Visit (HOSPITAL_COMMUNITY): Payer: Self-pay

## 2023-10-10 ENCOUNTER — Encounter (HOSPITAL_COMMUNITY): Payer: Self-pay | Admitting: Gastroenterology

## 2023-10-10 DIAGNOSIS — I4892 Unspecified atrial flutter: Secondary | ICD-10-CM | POA: Diagnosis not present

## 2023-10-10 LAB — CBC
HCT: 44.4 % (ref 39.0–52.0)
Hemoglobin: 13.6 g/dL (ref 13.0–17.0)
MCH: 28.9 pg (ref 26.0–34.0)
MCHC: 30.6 g/dL (ref 30.0–36.0)
MCV: 94.3 fL (ref 80.0–100.0)
Platelets: 394 10*3/uL (ref 150–400)
RBC: 4.71 MIL/uL (ref 4.22–5.81)
RDW: 13.9 % (ref 11.5–15.5)
WBC: 12.8 10*3/uL — ABNORMAL HIGH (ref 4.0–10.5)
nRBC: 0 % (ref 0.0–0.2)

## 2023-10-10 LAB — BASIC METABOLIC PANEL WITH GFR
Anion gap: 7 (ref 5–15)
BUN: 8 mg/dL (ref 8–23)
CO2: 25 mmol/L (ref 22–32)
Calcium: 8.4 mg/dL — ABNORMAL LOW (ref 8.9–10.3)
Chloride: 104 mmol/L (ref 98–111)
Creatinine, Ser: 0.73 mg/dL (ref 0.61–1.24)
GFR, Estimated: >60 mL/min (ref >60)
Glucose, Bld: 115 mg/dL — ABNORMAL HIGH (ref 70–99)
Potassium: 4 mmol/L (ref 3.5–5.1)
Sodium: 136 mmol/L (ref 135–145)

## 2023-10-10 LAB — MAGNESIUM: Magnesium: 2 mg/dL (ref 1.7–2.4)

## 2023-10-10 MED ORDER — DILTIAZEM HCL 25 MG/5ML IV SOLN
10.0000 mg | Freq: Once | INTRAVENOUS | Status: AC
  Administered 2023-10-10: 10 mg via INTRAVENOUS
  Filled 2023-10-10: qty 5

## 2023-10-10 MED ORDER — DILTIAZEM HCL ER COATED BEADS 180 MG PO CP24: 180.0000 mg | ORAL_CAPSULE | Freq: Two times a day (BID) | ORAL | Status: DC

## 2023-10-10 MED ORDER — DILTIAZEM HCL ER COATED BEADS 180 MG PO CP24
180.0000 mg | ORAL_CAPSULE | Freq: Two times a day (BID) | ORAL | Status: DC
  Administered 2023-10-10: 180 mg via ORAL
  Filled 2023-10-10: qty 1

## 2023-10-10 MED ORDER — PANTOPRAZOLE SODIUM 40 MG PO TBEC: 40.0000 mg | DELAYED_RELEASE_TABLET | Freq: Every day | ORAL | Status: DC

## 2023-10-10 MED ORDER — CITALOPRAM HYDROBROMIDE 20 MG PO TABS
20.0000 mg | ORAL_TABLET | Freq: Two times a day (BID) | ORAL | 0 refills | Status: DC
  Filled 2023-10-10: qty 60, 30d supply, fill #0

## 2023-10-10 MED ORDER — PANTOPRAZOLE SODIUM 40 MG PO TBEC
40.0000 mg | DELAYED_RELEASE_TABLET | ORAL | 0 refills | Status: DC | PRN
  Filled 2023-10-10: qty 30, 30d supply, fill #0

## 2023-10-10 MED ORDER — DILTIAZEM HCL ER COATED BEADS 180 MG PO CP24
180.0000 mg | ORAL_CAPSULE | Freq: Two times a day (BID) | ORAL | 0 refills | Status: DC
  Filled 2023-10-10: qty 30, 15d supply, fill #0

## 2023-10-10 NOTE — Progress Notes (Signed)
 PROGRESS NOTE    Cody Hampton  UXL:[id number] DOB: Jun 02, 1961 DOA: 10/06/2023 PCP: Mattie Marlin, DO    Brief Narrative:   63 year old history of A-fib on Eliquis started about 10 days ago, sleep apnea, gastric bypass, MGUS comes to the ER with 3 days of dark tarry stools.  In the ER noted to be in A-fib with RVR started on Cardizem drip, hemoglobin 13.9, Hemoccult positive.  GI following, planning on endoscopy after Eliquis washout.  For uncontrolled atrial fibrillation with RVR initially patient was started on Cardizem drip and slowly p.o. Cardizem and digoxin was introduced.  Patient was slowly transition to p.o. Cardizem, no further digoxin per cardiology team. The patient underwent endoscopy as well, no obvious evidence of bleeding noted.  Assessment & Plan:  Principal Problem:   Atrial flutter with rapid ventricular response (HCC) Active Problems:   Anxiety and depression   MGUS (monoclonal gammopathy of unknown significance)   OSA (obstructive sleep apnea)   S/P bariatric surgery   Atrial flutter (HCC)   GI bleeding   Acute GI bleed - Suspect in the setting of starting Eliquis recently for atrial fibrillation.  At [facility] EGD 2021 per patient was in 2021-normal.  Colonoscopy 2021 incomplete due to redundant colon. - Endoscopy did not show any obvious evidence of bleeding.  No ulcers were noted.  Eliquis to be resumed. -Hemoglobin is now stable.  Atrial Flutter/fib with RVR Essential hypertension - Initially started on Cardizem drip and never required digoxin.  Now consolidating to p.o. Cardizem only.  Okay to resume Eliquis.  Acceptable heart rate as high as 110 per cardiology.  Outpatient follow-up with EP. - TSH-normal - Echo January 2025-preserved EF, enlarged RV  Leukocytosis - Looking at previous trend appears to be chronic.  No signs of infection  Anxiety/depression - Celexa  Sleep apnea - Bedtime CPAP if necessary  Chronic pain - As needed meds  MGUS -  Followed by outpatient oncology  Status post bariatric surgery - On B12 supplements  PT/OT-pending   DVT prophylaxis: SCDs Start: 10/06/23 0544    Code Status: Full Code Family Communication:  Awaiting physical therapy evaluation   Subjective: Seen at bedside no complaints.  Awaiting physical therapy evaluation  Examination:  General exam: Appears calm and comfortable, morbid obesity Respiratory system: Clear to auscultation. Respiratory effort normal. Cardiovascular system: Tachycardia, irregularly irregular Gastrointestinal system: Abdomen is nondistended, soft and nontender. No organomegaly or masses felt. Normal bowel sounds heard. Central nervous system: Alert and oriented. No focal neurological deficits. Extremities: Symmetric 5 x 5 power. Skin: No rashes, lesions or ulcers Psychiatry: Judgement and insight appear normal. Mood & affect appropriate.                Diet Orders (From admission, onward)     Start     Ordered   10/09/23 1354  Diet regular Fluid consistency: Thin  Diet effective now       Question:  Fluid consistency:  Answer:  Thin   10/09/23 1353            Objective: Vitals:   10/10/23 0400 10/10/23 0718 10/10/23 0725 10/10/23 0800  BP:  (!) 118/45    Pulse:   (!) 102   Resp:   18   Temp: 98 F (36.7 C)   98.2 F (36.8 C)  TempSrc: Axillary   Oral  SpO2:   95%   Weight:      Height:        Intake/Output Summary (Last  24 hours) at 10/10/2023 1032 Last data filed at 10/10/2023 4782 Gross per 24 hour  Intake 290 ml  Output 2550 ml  Net -2260 ml   Filed Weights   10/06/23 0246 10/06/23 1223  Weight: (!) 190 kg (!) 200.1 kg    Scheduled Meds:  apixaban  5 mg Oral BID   bariatric multiple vitamin  1 tablet Oral Q breakfast   Chlorhexidine Gluconate Cloth  6 each Topical Daily   citalopram  20 mg Oral BID   diclofenac Sodium  2 g Topical QID   diltiazem  180 mg Oral BID   loperamide  4 mg Oral Daily   [START ON 10/11/2023]  pantoprazole  40 mg Oral Daily   Continuous Infusions:  Nutritional status     Body mass index is 63.3 kg/m.  Data Reviewed:   CBC: Recent Labs  Lab 10/06/23 1856 10/07/23 0235 10/08/23 0236 10/09/23 0254 10/10/23 0252  WBC 14.6* 14.1* 14.9* 11.4* 12.8*  HGB 13.7 13.5 13.3 12.7* 13.6  HCT 44.4 42.8 42.8 41.3 44.4  MCV 94.3 92.4 94.5 95.8 94.3  PLT 417* 407* 400 353 394   Basic Metabolic Panel: Recent Labs  Lab 10/06/23 0305 10/07/23 0235 10/08/23 0236 10/09/23 0254 10/10/23 0252  NA 135 136 136 135 136  K 4.0 4.0 4.5 3.9 4.0  CL 102 102 103 106 104  CO2 21* 25 25 22 25   GLUCOSE 172* 141* 134* 116* 115*  BUN 13 15 10  7* 8  CREATININE 0.69 0.73 0.68 0.63 0.73  CALCIUM 8.2* 8.4* 8.4* 8.1* 8.4*  MG  --  2.1 2.2 2.0 2.0  PHOS  --  2.6  --   --   --    GFR: Estimated Creatinine Clearance: 163.3 mL/min (by C-G formula based on SCr of 0.73 mg/dL). Liver Function Tests: Recent Labs  Lab 10/06/23 0305  AST 23  ALT 15  ALKPHOS 77  BILITOT 0.6  PROT 7.2  ALBUMIN 3.7   No results for input(s): "LIPASE", "AMYLASE" in the last 168 hours. No results for input(s): "AMMONIA" in the last 168 hours. Coagulation Profile: Recent Labs  Lab 10/06/23 0305  INR 1.2   Cardiac Enzymes: No results for input(s): "CKTOTAL", "CKMB", "CKMBINDEX", "TROPONINI" in the last 168 hours. BNP (last 3 results) No results for input(s): "PROBNP" in the last 8760 hours. HbA1C: No results for input(s): "HGBA1C" in the last 72 hours. CBG: No results for input(s): "GLUCAP" in the last 168 hours. Lipid Profile: No results for input(s): "CHOL", "HDL", "LDLCALC", "TRIG", "CHOLHDL", "LDLDIRECT" in the last 72 hours. Thyroid Function Tests: No results for input(s): "TSH", "T4TOTAL", "FREET4", "T3FREE", "THYROIDAB" in the last 72 hours. Anemia Panel: No results for input(s): "VITAMINB12", "FOLATE", "FERRITIN", "TIBC", "IRON", "RETICCTPCT" in the last 72 hours. Sepsis Labs: No results for  input(s): "PROCALCITON", "LATICACIDVEN" in the last 168 hours.  Recent Results (from the past 240 hours)  MRSA Next Gen by PCR, Nasal     Status: None   Collection Time: 10/06/23 12:35 PM   Specimen: Nasal Mucosa; Nasal Swab  Result Value Ref Range Status   MRSA by PCR Next Gen NOT DETECTED NOT DETECTED Final    Comment: (NOTE) The GeneXpert MRSA Assay (FDA approved for NASAL specimens only), is one component of a comprehensive MRSA colonization surveillance program. It is not intended to diagnose MRSA infection nor to guide or monitor treatment for MRSA infections. Test performance is not FDA approved in patients less than 11 years old. Performed at  [facility], [address], [place], [place] [address]          Radiology Studies: No results found.         LOS: 3 days   Time spent= 35 mins    Miguel Rota, MD [facility]  If 7PM-7AM, please contact night-coverage  10/10/2023, 10:32 AM

## 2023-10-10 NOTE — Plan of Care (Signed)

## 2023-10-10 NOTE — Evaluation (Signed)
 Physical Therapy Evaluation Patient Details Name: Cody Hampton MRN: [id number] DOB: 06-01-1961 Today's Date: 10/10/2023  History of Present Illness  Patient is a 63 year old male who presented on 3/29 with 3 day history of dark tarry stools. Patient was admitted with a fib with RVR, and hemoccult positive stool, acute GI bleed. PMH: MGUS, s/p bariaric surgery, sleep apnea, anxiety/depression.  Clinical Impression  Pt admitted with above diagnosis.  Pt currently with functional limitations due to the deficits listed below (see PT Problem List). Pt will benefit from acute skilled PT to increase their independence and safety with mobility to allow discharge.     The patient  reports that he  lives with family, wife is bed ridden, 3 special needs children, he is sole  driver and his vehicle is in  valet parking, meaning  that he plans to drive himself home.  Patient  did ambulate x 15' using his rollator. Patient's HR 152, 3/4 dyspnea. Patient reports that his  activity is limited.        If plan is discharge home, recommend the following: Assistance with cooking/housework;A lot of help with bathing/dressing/bathroom   Can travel by private vehicle        Equipment Recommendations None recommended by PT  Recommendations for Other Services       Functional Status Assessment Patient has had a recent decline in their functional status and demonstrates the ability to make significant improvements in function in a reasonable and predictable amount of time.     Precautions / Restrictions Precautions Precautions: Fall Precaution/Restrictions Comments: monitor HR Restrictions Weight Bearing Restrictions Per Provider Order: No      Mobility  Bed Mobility         Supine to sit: Min assist, HOB elevated, Used rails     General bed mobility comments: with increased time.    Transfers Overall transfer level: Needs assistance Equipment used: Rollator (4 wheels) Transfers: Sit to/from  Stand Sit to Stand: Supervision                Ambulation/Gait Ambulation/Gait assistance: Supervision, Contact guard assist Gait Distance (Feet): 80 Feet Assistive device: Rollator (4 wheels) Gait Pattern/deviations: Step-to pattern, Step-through pattern, Trunk flexed, Wide base of support Gait velocity: decr     General Gait Details: Patient walks slowly, not 3/4 dyspnea, . HR 152 max  Stairs            Wheelchair Mobility     Tilt Bed    Modified Rankin (Stroke Patients Only)       Balance Overall balance assessment: Needs assistance Sitting-balance support: Bilateral upper extremity supported Sitting balance-Leahy Scale: Fair Sitting balance - Comments: needed BUE support to maintain balance.   Standing balance support: Bilateral upper extremity supported, During functional activity, Reliant on assistive device for balance Standing balance-Leahy Scale: Poor Standing balance comment: heavy reliance on Rollator                             Pertinent Vitals/Pain Pain Assessment Pain Assessment: No/denies pain    Home Living Family/patient expects to be discharged to:: Private residence Living Arrangements: Spouse/significant other;Children Available Help at Discharge: Family;Available PRN/intermittently Type of Home: House Home Access: Stairs to enter Entrance Stairs-Rails: None Entrance Stairs-Number of Steps: 1 large step   Home Layout: One level Home Equipment: Rollator (4 wheels)      Prior Function Prior Level of Function : Independent/Modified Independent;Driving  ADLs Comments: patient indicated spending majority of time in bed at home with three children living at home with him. patient reported three children have special needs with patient indicating he is the only one who drives in house with patients wife bed bound at home. TD for LB dressing at home at baseline per patient report.     Extremity/Trunk  Assessment   Upper Extremity Assessment Upper Extremity Assessment: Overall WFL for tasks assessed    Lower Extremity Assessment Lower Extremity Assessment: Generalized weakness    Cervical / Trunk Assessment Cervical / Trunk Assessment: Other exceptions Cervical / Trunk Exceptions: body habitus impacting ability to maintain sitting balance in upright posture without BUE support.  Communication        Cognition   Behavior During Therapy: WFL for tasks assessed/performed   PT - Cognitive impairments: No apparent impairments                                 Cueing       General Comments      Exercises     Assessment/Plan    PT Assessment Patient needs continued PT services  PT Problem List Decreased strength;Decreased range of motion;Cardiopulmonary status limiting activity;Decreased activity tolerance;Decreased mobility;Decreased knowledge of precautions;Decreased balance       PT Treatment Interventions DME instruction;Therapeutic exercise;Gait training;Functional mobility training;Therapeutic activities;Patient/family education    PT Goals (Current goals can be found in the Care Plan section)  Acute Rehab PT Goals Patient Stated Goal: go home PT Goal Formulation: With patient Time For Goal Achievement: 10/24/23 Potential to Achieve Goals: Good    Frequency Min 3X/week     Co-evaluation PT/OT/SLP Co-Evaluation/Treatment: Yes Reason for Co-Treatment: For patient/therapist safety PT goals addressed during session: Mobility/safety with mobility         AM-PAC PT "6 Clicks" Mobility  Outcome Measure Help needed turning from your back to your side while in a flat bed without using bedrails?: A Little Help needed moving from lying on your back to sitting on the side of a flat bed without using bedrails?: A Little Help needed moving to and from a bed to a chair (including a wheelchair)?: A Little Help needed standing up from a chair using your arms  (e.g., wheelchair or bedside chair)?: A Little Help needed to walk in hospital room?: A Little Help needed climbing 3-5 steps with a railing? : A Lot 6 Click Score: 17    End of Session   Activity Tolerance: Patient tolerated treatment well Patient left: in chair;with call bell/phone within reach Nurse Communication: Mobility status PT Visit Diagnosis: Unsteadiness on feet (R26.81)    Time: 9528-4132 PT Time Calculation (min) (ACUTE ONLY): 22 min   Charges:   PT Evaluation $PT Eval Low Complexity: 1 Low   PT General Charges $$ ACUTE PT VISIT: 1 Visit         Blanchard Kelch PT Acute Rehabilitation Services Office [phone] Weekend pager-[phone]   Rada Hay 10/10/2023, 12:54 PM

## 2023-10-10 NOTE — Progress Notes (Signed)
   Patient Name: Cody Hampton Date of Encounter: 10/10/2023 [facility] Cardiologist: A fib clinic   Interval Summary  .    63 yr old male with PMH of HTN, morbid obesity, OSA, atrial flutter, admitted for melena, GI consulted and planning EGD once Eliquis wash out,  cardiology is following for Aflutter RVR.   Stable sx after EGD, seen ambulating in halls. HR 150 with ambulation, but not overly symptomatic. HR resting 100.   Vital Signs .    Vitals:   10/10/23 0400 10/10/23 0718 10/10/23 0725 10/10/23 0800  BP:  (!) 118/45    Pulse:   (!) 102   Resp:   18   Temp: 98 F (36.7 C)   98.2 F (36.8 C)  TempSrc: Axillary   Oral  SpO2:   95%   Weight:      Height:        Intake/Output Summary (Last 24 hours) at 10/10/2023 1044 Last data filed at 10/10/2023 1610 Gross per 24 hour  Intake 290 ml  Output 2550 ml  Net -2260 ml      10/06/2023   12:23 PM 10/06/2023    2:46 AM 09/27/2023   10:19 AM  Last 3 Weights  Weight (lbs) 441 lb 2.3 oz 418 lb 14 oz 420 lb  Weight (kg) 200.1 kg 190 kg 190.511 kg      Telemetry/ECG    Aflutter 50-70s up to 150  - Personally Reviewed  Physical Exam .   GEN: No acute distress.   Neck: unable to assess jvp Cardiac: Irregularly irregular,  no murmurs, rubs, or gallops.  Respiratory: Clear to auscultation bilaterally. On room air. Speak full sentence  GI: Soft, nontender, non-distended. Obese  MS: No pitting leg edema  Assessment & Plan .     Persistent atrial flutter with RVR - has had interruptions in [facility] due to concern for GI bleeding. No source on endoscopy, per GI will retrial eliquis. Rate control strategy at this time due to interrupted AC, would consider 3-4 weeks of uninterrupted AC and then cardioversion. Given inconsistent medication use at home, concern to do Tee/dccv in hospital and ensure patient takes 30 days of eliquis at home, also with recent GI dark stools, would want to ensure no further bleeding.  - he has afib  clinic appt. - dilt long acting 180 mg BID per patient preference. Titrate as needed. Digoxin only effective for resting rates and with noncompliance would be concerned about digoxin use at home.  - cont eliquis 5 mg bid.   GI bleed Obesity  OSA HTN Leukocytosis  Anxiety/depression MGUS - per primary team   Cardiology will sign off, d/w hospital team. Can titrate rate control meds as needed and reengage cardiology if needed in hospital.    For questions or updates, please contact [facility] Please consult [email or web address] for contact info under        Signed, Parke Poisson, MD  10/10/23 10:44 AM

## 2023-10-10 NOTE — Progress Notes (Signed)
 Discharge education provided, all questions answered. Patient received medications from [facility] pharmacy.  Patient would like to drive back home, MD aware, patient seen by PT/OT. Vital signs stable. PIV removed. All belongings returned.   Patient assisted to main entrance and into vehicle safely. No complications during transfer from wheelchair to vehicle.

## 2023-10-10 NOTE — Discharge Summary (Signed)
 Physician Discharge Summary  Cree Kunert WUJ:[id number] DOB: [birth year] DOA: 10/06/2023  PCP: Mattie Marlin, DO  Admit date: 10/06/2023 Discharge date: 10/10/2023  Admitted From: Home Disposition:  Home  Recommendations for Outpatient Follow-up:  Follow up with PCP in 1-2 weeks Please obtain BMP/CBC in one week your next doctors visit.  Eliquis BID Cardizem 180mg  bid per cardiology. He will not take Beta blocker as he says it causes him allergy. Can try Digoxin as outpatient again, he had responded well in terms of rate control in the hospital. For now cardiology recommending against long term use.  Will need to follow up outpatient Cardiology and EP.    Discharge Condition: Stable CODE STATUS: Full Diet recommendation: Cardiac.   Brief/Interim Summary: Brief Narrative:   63 year old history of A-fib on Eliquis started about 10 days ago, sleep apnea, gastric bypass, MGUS comes to the ER with 3 days of dark tarry stools.  In the ER noted to be in A-fib with RVR started on Cardizem drip, hemoglobin 13.9, Hemoccult positive.  GI following, planning on endoscopy after Eliquis washout.  For uncontrolled atrial fibrillation with RVR initially patient was started on Cardizem drip and slowly p.o. Cardizem and digoxin was introduced.  Patient was slowly transition to p.o. Cardizem, no further digoxin per cardiology team. The patient underwent endoscopy as well, no obvious evidence of bleeding noted. PT/OT = No follow up.   Assessment & Plan:  Principal Problem:   Atrial flutter with rapid ventricular response (HCC) Active Problems:   Anxiety and depression   MGUS (monoclonal gammopathy of unknown significance)   OSA (obstructive sleep apnea)   S/P bariatric surgery   Atrial flutter (HCC)   GI bleeding   Acute GI bleed - Suspect in the setting of starting Eliquis recently for atrial fibrillation.  At [facility]GD 2021 per patient was in 2021-normal.  Colonoscopy 2021 incomplete due to  redundant colon. - Endoscopy did not show any obvious evidence of bleeding.  No ulcers were noted.  Eliquis to be resumed. -Hemoglobin is now stable.  Atrial Flutter/fib with RVR Essential hypertension - Initially started on Cardizem drip and never required digoxin.  Now consolidating to p.o. Cardizem only.  Okay to resume Eliquis.  Acceptable heart rate as high as 110-120 per cardiology.  Outpatient follow-up with EP. - TSH-normal - Echo January 2025-preserved EF, enlarged RV  Leukocytosis - Looking at previous trend appears to be chronic.  No signs of infection  Anxiety/depression - Celexa  Sleep apnea - Bedtime CPAP if necessary  Chronic pain - As needed meds  MGUS - Followed by outpatient oncology  Status post bariatric surgery - On B12 supplements  PT/OT- no f/u   DVT prophylaxis: SCDs Start: 10/06/23 0544    Code Status: Full Code Family Communication:  Discharge.    Subjective: Seen at bedside no complaints.   Examination:  General exam: Appears calm and comfortable, morbid obesity Respiratory system: Clear to auscultation. Respiratory effort normal. Cardiovascular system: Tachycardia, irregularly irregular Gastrointestinal system: Abdomen is nondistended, soft and nontender. No organomegaly or masses felt. Normal bowel sounds heard. Central nervous system: Alert and oriented. No focal neurological deficits. Extremities: Symmetric 5 x 5 power. Skin: No rashes, lesions or ulcers Psychiatry: Judgement and insight appear normal. Mood & affect appropriate.    Discharge Diagnoses:  Principal Problem:   Atrial flutter with rapid ventricular response (HCC) Active Problems:   Anxiety and depression   MGUS (monoclonal gammopathy of unknown significance)   OSA (obstructive sleep apnea)  S/P bariatric surgery   Atrial flutter (HCC)   GI bleeding   Chronic anticoagulation      Discharge Exam: Vitals:   10/10/23 1137 10/10/23 1217  BP:    Pulse: (!)  121 (!) 114  Resp: 15 17  Temp:  98.3 F (36.8 C)  SpO2: 94% 92%   Vitals:   10/10/23 1106 10/10/23 1109 10/10/23 1137 10/10/23 1217  BP: (!) 121/54     Pulse:  (!) 125 (!) 121 (!) 114  Resp:  16 15 17   Temp:    98.3 F (36.8 C)  TempSrc:    Oral  SpO2:  94% 94% 92%  Weight:      Height:          Discharge Instructions  Discharge Instructions     meds to beds pharmacy consult ([facility] ONLY)   Complete by: As directed       Allergies as of 10/10/2023       Reactions   Metoprolol Anxiety, Hypertension, Nausea Only, Other (See Comments), Photosensitivity, Shortness Of Breath   Duloxetine Other (See Comments)   Made mood worse.   Gabapentin Other (See Comments)   "Not tolerated well."   Hydrocodone Other (See Comments)   "Made me feel odd in my head."        Medication List     STOP taking these medications    bisoprolol 5 MG tablet Commonly known as: ZEBETA       TAKE these medications    apixaban 5 MG Tabs tablet Commonly known as: Eliquis Take 1 tablet (5 mg total) by mouth 2 (two) times daily.   Bariatric Multivitamins/Iron Caps Take 1 capsule by mouth daily with breakfast.   citalopram 20 MG tablet Commonly known as: CELEXA Take 1 tablet (20 mg total) by mouth 2 (two) times daily.   diltiazem 180 MG 24 hr capsule Commonly known as: CARDIZEM CD Take 1 capsule (180 mg total) by mouth 2 (two) times daily. What changed:  medication strength how much to take when to take this   loperamide 2 MG tablet Commonly known as: IMODIUM A-D Take 4 mg by mouth daily.   oxyCODONE-acetaminophen 5-325 MG tablet Commonly known as: PERCOCET/ROXICET Take 1 tablet by mouth daily as needed for moderate pain (pain score 4-6) or severe pain (pain score 7-10).   pantoprazole 40 MG tablet Commonly known as: PROTONIX Take 1 tablet (40 mg total) by mouth as needed (for reflux or GERD symptoms).   polycarbophil 625 MG tablet Commonly known as:  FIBERCON Take 625 mg by mouth daily.   testosterone cypionate 200 MG/ML injection Commonly known as: DEPOTESTOSTERONE CYPIONATE Inject into the muscle every 14 (fourteen) days.        Allergies  Allergen Reactions   Metoprolol Anxiety, Hypertension, Nausea Only, Other (See Comments), Photosensitivity and Shortness Of Breath   Duloxetine Other (See Comments)    Made mood worse.   Gabapentin Other (See Comments)    "Not tolerated well."   Hydrocodone Other (See Comments)    "Made me feel odd in my head."    You were cared for by a hospitalist during your hospital stay. If you have any questions about your discharge medications or the care you received while you were in the hospital after you are discharged, you can call the unit and asked to speak with the hospitalist on call if the hospitalist that took care of you is not available. Once you are discharged, your primary care physician will  handle any further medical issues. Please note that no refills for any discharge medications will be authorized once you are discharged, as it is imperative that you return to your primary care physician (or establish a relationship with a primary care physician if you do not have one) for your aftercare needs so that they can reassess your need for medications and monitor your lab values.  You were cared for by a hospitalist during your hospital stay. If you have any questions about your discharge medications or the care you received while you were in the hospital after you are discharged, you can call the unit and asked to speak with the hospitalist on call if the hospitalist that took care of you is not available. Once you are discharged, your primary care physician will handle any further medical issues. Please note that NO REFILLS for any discharge medications will be authorized once you are discharged, as it is imperative that you return to your primary care physician (or establish a relationship with  a primary care physician if you do not have one) for your aftercare needs so that they can reassess your need for medications and monitor your lab values.  Please request your Prim.MD to go over all Hospital Tests and Procedure/Radiological results at the follow up, please get all Hospital records sent to your Prim MD by signing hospital release before you go home.  Get CBC, CMP, 2 view Chest X ray checked  by Primary MD during your next visit or SNF MD in 5-7 days ( we routinely change or add medications that can affect your baseline labs and fluid status, therefore we recommend that you get the mentioned basic workup next visit with your PCP, your PCP may decide not to get them or add new tests based on their clinical decision)  On your next visit with your primary care physician please Get Medicines reviewed and adjusted.  If you experience worsening of your admission symptoms, develop shortness of breath, life threatening emergency, suicidal or homicidal thoughts you must seek medical attention immediately by calling 911 or calling your MD immediately  if symptoms less severe.  You Must read complete instructions/literature along with all the possible adverse reactions/side effects for all the Medicines you take and that have been prescribed to you. Take any new Medicines after you have completely understood and accpet all the possible adverse reactions/side effects.   Do not drive, operate heavy machinery, perform activities at heights, swimming or participation in water activities or provide baby sitting services if your were admitted for syncope or siezures until you have seen by Primary MD or a Neurologist and advised to do so again.  Do not drive when taking Pain medications.   Procedures/Studies: No results found.   The results of significant diagnostics from this hospitalization (including imaging, microbiology, ancillary and laboratory) are listed below for reference.      Microbiology: Recent Results (from the past 240 hours)  MRSA Next Gen by PCR, Nasal     Status: None   Collection Time: 10/06/23 12:35 PM   Specimen: Nasal Mucosa; Nasal Swab  Result Value Ref Range Status   MRSA by PCR Next Gen NOT DETECTED NOT DETECTED Final    Comment: (NOTE) The GeneXpert MRSA Assay (FDA approved for NASAL specimens only), is one component of a comprehensive MRSA colonization surveillance program. It is not intended to diagnose MRSA infection nor to guide or monitor treatment for MRSA infections. Test performance is not FDA approved in patients  less than 28 years old. Performed at [facility], [address], [place], [place] [address]      Labs: BNP (last 3 results) No results for input(s): "BNP" in the last 8760 hours. Basic Metabolic Panel: Recent Labs  Lab 10/06/23 0305 10/07/23 0235 10/08/23 0236 10/09/23 0254 10/10/23 0252  NA 135 136 136 135 136  K 4.0 4.0 4.5 3.9 4.0  CL 102 102 103 106 104  CO2 21* 25 25 22 25   GLUCOSE 172* 141* 134* 116* 115*  BUN 13 15 10  7* 8  CREATININE 0.69 0.73 0.68 0.63 0.73  CALCIUM 8.2* 8.4* 8.4* 8.1* 8.4*  MG  --  2.1 2.2 2.0 2.0  PHOS  --  2.6  --   --   --    Liver Function Tests: Recent Labs  Lab 10/06/23 0305  AST 23  ALT 15  ALKPHOS 77  BILITOT 0.6  PROT 7.2  ALBUMIN 3.7   No results for input(s): "LIPASE", "AMYLASE" in the last 168 hours. No results for input(s): "AMMONIA" in the last 168 hours. CBC: Recent Labs  Lab 10/06/23 1856 10/07/23 0235 10/08/23 0236 10/09/23 0254 10/10/23 0252  WBC 14.6* 14.1* 14.9* 11.4* 12.8*  HGB 13.7 13.5 13.3 12.7* 13.6  HCT 44.4 42.8 42.8 41.3 44.4  MCV 94.3 92.4 94.5 95.8 94.3  PLT 417* 407* 400 353 394   Cardiac Enzymes: No results for input(s): "CKTOTAL", "CKMB", "CKMBINDEX", "TROPONINI" in the last 168 hours. BNP: Invalid input(s): "POCBNP" CBG: No results for input(s): "GLUCAP" in the last 168 hours. D-Dimer No results  for input(s): "DDIMER" in the last 72 hours. Hgb A1c No results for input(s): "HGBA1C" in the last 72 hours. Lipid Profile No results for input(s): "CHOL", "HDL", "LDLCALC", "TRIG", "CHOLHDL", "LDLDIRECT" in the last 72 hours. Thyroid function studies No results for input(s): "TSH", "T4TOTAL", "T3FREE", "THYROIDAB" in the last 72 hours.  Invalid input(s): "FREET3" Anemia work up No results for input(s): "VITAMINB12", "FOLATE", "FERRITIN", "TIBC", "IRON", "RETICCTPCT" in the last 72 hours. Urinalysis No results found for: "COLORURINE", "APPEARANCEUR", "LABSPEC", "PHURINE", "GLUCOSEU", "HGBUR", "BILIRUBINUR", "KETONESUR", "PROTEINUR", "UROBILINOGEN", "NITRITE", "LEUKOCYTESUR" Sepsis Labs Recent Labs  Lab 10/07/23 0235 10/08/23 0236 10/09/23 0254 10/10/23 0252  WBC 14.1* 14.9* 11.4* 12.8*   Microbiology Recent Results (from the past 240 hours)  MRSA Next Gen by PCR, Nasal     Status: None   Collection Time: 10/06/23 12:35 PM   Specimen: Nasal Mucosa; Nasal Swab  Result Value Ref Range Status   MRSA by PCR Next Gen NOT DETECTED NOT DETECTED Final    Comment: (NOTE) The GeneXpert MRSA Assay (FDA approved for NASAL specimens only), is one component of a comprehensive MRSA colonization surveillance program. It is not intended to diagnose MRSA infection nor to guide or monitor treatment for MRSA infections. Test performance is not FDA approved in patients less than 8 years old. Performed at [facility], [address] [place], [place] [address]      Time coordinating discharge:  I have spent 35 minutes face to face with the patient and on the ward discussing the patients care, assessment, plan and disposition with other care givers. >50% of the time was devoted counseling the patient about the risks and benefits of treatment/Discharge disposition and coordinating care.   SIGNED:   Miguel Rota, MD  [facility] 10/10/2023, 1:35 PM   If 7PM-7AM,  please contact night-coverage

## 2023-10-10 NOTE — Progress Notes (Signed)
 Discharge medications delivered to bedside. Burna Sis RN made aware D Susann Givens RN

## 2023-10-10 NOTE — Evaluation (Signed)
 Occupational Therapy Evaluation Patient Details Name: Cody Hampton MRN: [id number] DOB: [birth year] Today's Date: 10/10/2023   History of Present Illness   Patient is a 62 year old male who presented on 3/29 with 3 day history of dark tarry stools. Patient was admitted with a fib with RVR, and hemoccult positive stool, acute GI bleed. PMH: MGUS, s/p bariaric surgery, sleep apnea, anxiety/depression.     Clinical Impressions Patient is a 63 year old male who was admitted for above. Patient was living at home mostly in bed but able to use rollator to get around to bathroom and driving to store. Patient HR noted to reach 151 bpm with functional mobility during session. Concerns were raised with patient unable to hold himself in sitting position without BUE support. Patient reported that in the car he uses the car wall and steering wheel to maintain posture to be able to engage in driving. Patient endorsed being ready to d/c home today. Patient would benefit from continued work on sitting balance and compensatory strategies to improve ability to engage in ADLs. Patient plans to d/c home with children support when medically stable.      If plan is discharge home, recommend the following:   A little help with bathing/dressing/bathroom;Assistance with cooking/housework;Assist for transportation;Help with stairs or ramp for entrance     Functional Status Assessment   Patient has had a recent decline in their functional status and demonstrates the ability to make significant improvements in function in a reasonable and predictable amount of time.     Equipment Recommendations   None recommended by OT      Precautions/Restrictions   Precautions Precautions: Fall Precaution/Restrictions Comments: monitor HR Restrictions Weight Bearing Restrictions Per Provider Order: No     Mobility Bed Mobility Overal bed mobility: Needs Assistance Bed Mobility: Supine to Sit     Supine to sit:  Min assist, HOB elevated, Used rails     General bed mobility comments: with increased time.       Balance Overall balance assessment: Needs assistance Sitting-balance support: Bilateral upper extremity supported Sitting balance-Leahy Scale: Fair Sitting balance - Comments: needed BUE support to maintain balance.           ADL either performed or assessed with clinical judgement   ADL Overall ADL's : Needs assistance/impaired     Grooming: Sitting;Set up   Upper Body Bathing: Sitting;Minimal assistance   Lower Body Bathing: Sitting/lateral leans;Maximal assistance   Upper Body Dressing : Sitting;Minimal assistance   Lower Body Dressing: Sitting/lateral leans;Maximal assistance   Toilet Transfer: Contact guard assist;Ambulation;Rollator (4 wheels) Toilet Transfer Details (indicate cue type and reason): with HR up to 151 bpm with mobility. patient reporting legs feeling weak Toileting- Clothing Manipulation and Hygiene: Sitting/lateral lean;Maximal assistance               Vision   Vision Assessment?: No apparent visual deficits            Pertinent Vitals/Pain Pain Assessment Pain Assessment: No/denies pain     Extremity/Trunk Assessment Upper Extremity Assessment Upper Extremity Assessment: Overall WFL for tasks assessed   Lower Extremity Assessment Lower Extremity Assessment: Defer to PT evaluation   Cervical / Trunk Assessment Cervical / Trunk Assessment: Other exceptions Cervical / Trunk Exceptions: body habitus impacting ability to maintain sitting balance in upright posture without BUE support.      Cognition Arousal: Alert Behavior During Therapy: WFL for tasks assessed/performed Cognition: Cognition impaired     Awareness: Intellectual awareness impaired  Executive functioning impairment (select all impairments): Reasoning OT - Cognition Comments: patient was noted to have decreased awareness of deficits.                                     Home Living Family/patient expects to be discharged to:: Private residence Living Arrangements: Spouse/significant other;Children Available Help at Discharge: Family;Available PRN/intermittently Type of Home: House Home Access: Stairs to enter [employer] of Steps: 1 large step Entrance Stairs-Rails: None Home Layout: One level     Bathroom Shower/Tub: Runner, broadcasting/film/video: Rollator (4 wheels)          Prior Functioning/Environment Prior Level of Function : Independent/Modified Independent;Driving               ADLs Comments: patient indicated spending majority of time in bed at home with three children living at home with him. patient reported three children have special needs with patient indicating he is the only one who drives in house with patients wife bed bound at home. TD for LB dressing at home at baseline per patient report.    OT Problem List: Impaired balance (sitting and/or standing);Decreased knowledge of precautions;Cardiopulmonary status limiting activity;Decreased safety awareness;Decreased activity tolerance   OT Treatment/Interventions: Self-care/ADL training;DME and/or AE instruction;Therapeutic activities;Balance training;Therapeutic exercise;Energy conservation;Patient/family education      OT Goals(Current goals can be found in the care plan section)   Acute Rehab OT Goals Patient Stated Goal: to go home today OT Goal Formulation: With patient Time For Goal Achievement: 10/24/23 Potential to Achieve Goals: Fair   OT Frequency:  Min 1X/week       AM-PAC OT "6 Clicks" Daily Activity     Outcome Measure Help from another person eating meals?: None Help from another person taking care of personal grooming?: A Little Help from another person toileting, which includes using toliet, bedpan, or urinal?: A Little Help from another person bathing (including washing, rinsing, drying)?: A Lot Help  from another person to put on and taking off regular upper body clothing?: A Little Help from another person to put on and taking off regular lower body clothing?: A Lot 6 Click Score: 17   End of Session Equipment Utilized During Treatment: Other (comment) (rollator) Nurse Communication: Mobility status;Other (comment) (concerns over patient driving himself home.)  Activity Tolerance: Patient tolerated treatment well Patient left: in chair;with call bell/phone within reach;with chair alarm set  OT Visit Diagnosis: Unsteadiness on feet (R26.81);Other abnormalities of gait and mobility (R26.89)                Time: 1610-9604 OT Time Calculation (min): 18 min Charges:  OT General Charges $OT Visit: 1 Visit OT Evaluation $OT Eval Moderate Complexity: 1 Mod  Kevontae Burgoon OTR/L, MS Acute Rehabilitation Department Office# [phone]   Selinda Flavin 10/10/2023, 11:45 AM

## 2023-10-18 ENCOUNTER — Institutional Professional Consult (permissible substitution): Payer: Self-pay | Admitting: Neurology

## 2023-10-18 ENCOUNTER — Ambulatory Visit (HOSPITAL_COMMUNITY)
Admit: 2023-10-18 | Discharge: 2023-10-18 | Disposition: A | Discharge status: Home / Self Care | Attending: Internal Medicine | Admitting: Internal Medicine

## 2023-10-18 VITALS — BP 100/76 | HR 124 | Ht 70.0 in | Wt >= 6400 oz

## 2023-10-18 DIAGNOSIS — I10 Essential (primary) hypertension: Secondary | ICD-10-CM | POA: Insufficient documentation

## 2023-10-18 DIAGNOSIS — I4891 Unspecified atrial fibrillation: Secondary | ICD-10-CM

## 2023-10-18 DIAGNOSIS — I480 Paroxysmal atrial fibrillation: Secondary | ICD-10-CM | POA: Diagnosis not present

## 2023-10-18 DIAGNOSIS — Z79899 Other long term (current) drug therapy: Secondary | ICD-10-CM | POA: Insufficient documentation

## 2023-10-18 DIAGNOSIS — E669 Obesity, unspecified: Secondary | ICD-10-CM | POA: Insufficient documentation

## 2023-10-18 DIAGNOSIS — I4892 Unspecified atrial flutter: Secondary | ICD-10-CM | POA: Insufficient documentation

## 2023-10-18 DIAGNOSIS — G4733 Obstructive sleep apnea (adult) (pediatric): Secondary | ICD-10-CM | POA: Diagnosis not present

## 2023-10-18 DIAGNOSIS — Z7901 Long term (current) use of anticoagulants: Secondary | ICD-10-CM | POA: Insufficient documentation

## 2023-10-18 DIAGNOSIS — Z6841 Body Mass Index (BMI) 40.0 and over, adult: Secondary | ICD-10-CM | POA: Diagnosis not present

## 2023-10-18 DIAGNOSIS — R9431 Abnormal electrocardiogram [ECG] [EKG]: Secondary | ICD-10-CM | POA: Diagnosis not present

## 2023-10-18 MED ORDER — DILTIAZEM HCL ER COATED BEADS 180 MG PO CP24: 180.0000 mg | ORAL_CAPSULE | Freq: Two times a day (BID) | ORAL | 1 refills | Status: DC

## 2023-10-18 MED ORDER — APIXABAN 5 MG PO TABS
5.0000 mg | ORAL_TABLET | Freq: Two times a day (BID) | ORAL | 11 refills | Status: DC
Start: 2023-10-18 — End: 2024-01-02

## 2023-10-18 NOTE — Patient Instructions (Addendum)
 Cardioversion scheduled for: April. 25th    - Arrive at the [facility] and go to admitting at 9:00 am   - Do not eat or drink anything after midnight the night prior to your procedure.   - Take all your morning medication (except diabetic medications) with a sip of water prior to arrival.  - You will not be able to drive home after your procedure.    - Do NOT miss any doses of your blood thinner - if you should miss a dose please notify our office immediately.   - If you feel as if you go back into normal rhythm prior to scheduled cardioversion, please notify our office immediately.   If your procedure is canceled in the cardioversion suite you will be charged a cancellation fee.            For those patients who have a scheduled procedure/anesthesia on the same day of the week as their dose, hold the medication on the day of surgery.  They can take their scheduled dose the week before.  **Patients on the above medications scheduled for elective procedures that have not held the medication for the appropriate amount of time are at risk of cancellation or change in the anesthetic plan.

## 2023-10-18 NOTE — Progress Notes (Addendum)
 Primary Care Physician: Mattie Marlin, DO Primary Cardiologist: None Electrophysiologist: None     Referring Physician: ED   Cody Hampton is a 63 y.o. male with a history of HTN, obesity, OSA, and paroxysmal atrial fibrillation who presents for consultation in the [facility]. ED visit on 06/26/23 for new onset atrial flutter with RVR s/p successful DCCV but was noted to return into Afib HR 80. Patient notes it occurred while at dentist, concerning for local anesthetic systemic toxicity (LAST). He did not appear to have other symptoms of LAST. Discharged on Lopressor 12.5 mg BID. Patient is on Eliquis 5 mg BID for a CHADS2VASC score of 1.  On evaluation today, he is currently in NSR. Patient did not know he had returned into Afib after cardioversion. He has history of OSA. He does not drink alcohol. He does admit to stressful environment when taking son for his cancer treatment. He drinks 1 cup of coffee daily and then 54 oz of half no caffeine / half caffeinated soda. He does not know if he has had any other episodes of Afib since ED visit. He has been compliant with Lopressor 12.5 mg BID and Eliquis 5 mg BID.   On follow up 08/08/23, he is currently in NSR. He has had no episodes of Afib since last office visit. He stopped the Eliquis due to cost.   On follow up 09/27/23, he is currently in atrial flutter with RVR. Wife contacted office noting patient was in sinus tachycardia at PCP office with HR 148. He is planning to have major dental work for removal of multiple upper teeth. We called diltiazem 120 mg daily yesterday to pharmacy but patient has not picked up yet. He will pick up Eliquis later today as they typically go to the pharmacy every Thursday. He feels lightheaded and dizzy at times.  On follow up 10/18/23, he is currently in atrial flutter with RVR. Patient here with son today and we spoke with wife during visit via phone. Seen in ED on 3/20 after  last office visit due to atrial flutter with RVR. Due to not being on anticoagulation he was discharged without intervention. Hospital admission 3/29-4/2 due to 3 days of dark tarry stools. He was still in Afib with RVR and started on Cardizem drip. Digoxin given while in hospital but discontinued prior to discharge. Hemoccult positive. S/p endoscopy with no obvious evidence of bleeding. Eliquis resumed on discharge and patient given diltiazem 180 mg BID. He began full anticoagulation with Eliquis on 4/3.   Today, he denies symptoms of palpitations, chest pain, shortness of breath, orthopnea, PND, lower extremity edema, dizziness, presyncope, syncope, snoring, daytime somnolence, bleeding, or neurologic sequela. The patient is tolerating medications without difficulties and is otherwise without complaint today.    Atrial Fibrillation Risk Factors:  he does have symptoms or diagnosis of sleep apnea.  he has a BMI of Body mass index is 60.12 kg/m.Marland Kitchen Filed Weights   10/18/23 1031  Weight: (!) 190.1 kg     Current Outpatient Medications  Medication Sig Dispense Refill   citalopram (CELEXA) 20 MG tablet Take 1 tablet (20 mg total) by mouth 2 (two) times daily. 60 tablet 0   loperamide (IMODIUM A-D) 2 MG tablet Take 4 mg by mouth daily.     Multiple Vitamins-Minerals (BARIATRIC MULTIVITAMINS/IRON) CAPS Take 1 capsule by mouth daily with breakfast.     oxyCODONE-acetaminophen (PERCOCET/ROXICET) 5-325 MG tablet Take 1 tablet by mouth daily as  needed for moderate pain (pain score 4-6) or severe pain (pain score 7-10).     pantoprazole (PROTONIX) 40 MG tablet Take 1 tablet (40 mg total) by mouth as needed (for reflux or GERD symptoms). 30 tablet 0   polycarbophil (FIBERCON) 625 MG tablet Take 625 mg by mouth daily.     testosterone cypionate (DEPOTESTOSTERONE CYPIONATE) 200 MG/ML injection Inject into the muscle every 14 (fourteen) days.     apixaban (ELIQUIS) 5 MG TABS tablet Take 1 tablet (5 mg  total) by mouth 2 (two) times daily. 60 tablet 11   diltiazem (CARDIZEM CD) 180 MG 24 hr capsule Take 1 capsule (180 mg total) by mouth 2 (two) times daily. 90 capsule 1   No current facility-administered medications for this encounter.    Atrial Fibrillation Management history:  Previous antiarrhythmic drugs: none Previous cardioversions: 06/26/23 (ED) Previous ablations: none Anticoagulation history: Eliquis   ROS- All systems are reviewed and negative except as per the HPI above.  Physical Exam: BP 100/76   Pulse (!) 124   Ht 5\' 10"  (1.778 m)   Wt (!) 190.1 kg   BMI 60.12 kg/m   GEN- The patient is ill appearing and diaphoretic, alert and oriented x 3 today.   Neck - no JVD or carotid bruit noted Lungs- Clear to ausculation bilaterally, normal work of breathing Heart- Irregular tachycardic rate and rhythm, no murmurs, rubs or gallops, PMI not laterally displaced Extremities- no clubbing, cyanosis, or edema Skin - no rash or ecchymosis noted   EKG today demonstrates  Vent. rate 124 BPM PR interval 158 ms QRS duration 96 ms QT/QTcB 302/433 ms P-R-T axes 152 -57 -9 Unusual P axis, possible ectopic atrial tachycardia with undetermined rhythm irregularity Left axis deviation Cannot rule out Anterior infarct , age undetermined Abnormal ECG When compared with ECG of 08-Oct-2023 20:27, PREVIOUS ECG IS PRESENT  Echo 08/07/23: 1. Left ventricular ejection fraction, by estimation, is 60 to 65%. The  left ventricle has normal function. The left ventricle has no regional  wall motion abnormalities. There is mild asymmetric left ventricular  hypertrophy of the septal segment. Left  ventricular diastolic parameters were normal.   2. Right ventricular systolic function is normal. The right ventricular  size is severely enlarged. There is normal pulmonary artery systolic  pressure.   3. Right atrial size was mildly dilated.   4. The mitral valve is normal in structure. No  evidence of mitral valve  regurgitation. No evidence of mitral stenosis.   5. The aortic valve is tricuspid. Aortic valve regurgitation is not  visualized. No aortic stenosis is present.   Cardiac monitor 06/2023-07/2023: Patch Wear Time:  13 days and 23 hours   HR 49 - 211, average 71 bpm. No atrial fibrillation detected. Occasional supraventricular ectopy, 3.5%. Rare ventricular ectopy. No sustained arrhythmias. No symptom trigger episodes. 41 SVT episodes, all less than 12 seconds     [facility] Cardiac Electrophysiology   ASSESSMENT & PLAN CHA2DS2-VASc Score = 1  The patient's score is based upon: CHF History: 0 HTN History: 1 Diabetes History: 0 Stroke History: 0 Vascular Disease History: 0 Age Score: 0 Gender Score: 0       ASSESSMENT AND PLAN: Persistent Atrial Flutter The patient's CHA2DS2-VASc score is 1, indicating a 0.6% annual risk of stroke.    He is currently in atrial flutter with RVR. We discussed the procedure cardioversion to try to convert to NSR. We discussed the risks vs benefits of this procedure  and how ultimately we cannot predict whether a patient will have early return of arrhythmia post procedure. We briefly discussed AAD therapy if he has ERAF. He is not a candidate for ablation due to BMI. After discussion, the patient wishes to proceed with cardioversion. Bmet and CBC drawn on 4/2. Wife asked about patient requiring dentition surgery and I advised that Eliquis would be able to be stopped 4 weeks from date of cardioversion. Continue diltiazem 180 mg BID. Continue Eliquis 5 mg BID without interruption.   If patient were to require AAD therapy going forward, it is very likely he would have to transition away from Celexa to a different mood stabilizer that does not prolong the QT interval. He could technically be considered for Multaq, flecainide, Tikosyn, or amiodarone. However, this would require a commitment to compliance with regular  visits due to medications that have the potential to be pro arrhythmic.   Informed Consent   Shared Decision Making/Informed Consent The risks (stroke, cardiac arrhythmias rarely resulting in the need for a temporary or permanent pacemaker, skin irritation or burns and complications associated with conscious sedation including aspiration, arrhythmia, respiratory failure and death), benefits (restoration of normal sinus rhythm) and alternatives of a direct current cardioversion were explained in detail to Mr. Ortez and he agrees to proceed.        PR 144 ms Qtc 412 ms  Lake Bells, PA-C  Afib Clinic [facility] [address] [place], [place] [address] [phone]

## 2023-10-19 ENCOUNTER — Ambulatory Visit (HOSPITAL_COMMUNITY): Admitting: Internal Medicine

## 2023-10-22 ENCOUNTER — Other Ambulatory Visit: Payer: Self-pay

## 2023-10-22 ENCOUNTER — Inpatient Hospital Stay (HOSPITAL_COMMUNITY)
Admission: EM | Admit: 2023-10-22 | Discharge: 2023-10-30 | DRG: 378 | Disposition: A | Discharge status: Home / Self Care | Attending: Internal Medicine | Admitting: Internal Medicine

## 2023-10-22 ENCOUNTER — Encounter: Payer: Self-pay | Admitting: Hematology

## 2023-10-22 ENCOUNTER — Encounter (HOSPITAL_COMMUNITY): Payer: Self-pay

## 2023-10-22 DIAGNOSIS — D72829 Elevated white blood cell count, unspecified: Secondary | ICD-10-CM | POA: Diagnosis present

## 2023-10-22 DIAGNOSIS — K922 Gastrointestinal hemorrhage, unspecified: Secondary | ICD-10-CM | POA: Diagnosis not present

## 2023-10-22 DIAGNOSIS — K5900 Constipation, unspecified: Secondary | ICD-10-CM | POA: Diagnosis present

## 2023-10-22 DIAGNOSIS — Z7901 Long term (current) use of anticoagulants: Secondary | ICD-10-CM | POA: Diagnosis not present

## 2023-10-22 DIAGNOSIS — I483 Typical atrial flutter: Secondary | ICD-10-CM | POA: Diagnosis not present

## 2023-10-22 DIAGNOSIS — R739 Hyperglycemia, unspecified: Secondary | ICD-10-CM | POA: Diagnosis present

## 2023-10-22 DIAGNOSIS — Z79899 Other long term (current) drug therapy: Secondary | ICD-10-CM

## 2023-10-22 DIAGNOSIS — E861 Hypovolemia: Secondary | ICD-10-CM | POA: Diagnosis not present

## 2023-10-22 DIAGNOSIS — K921 Melena: Secondary | ICD-10-CM | POA: Diagnosis present

## 2023-10-22 DIAGNOSIS — E785 Hyperlipidemia, unspecified: Secondary | ICD-10-CM | POA: Diagnosis present

## 2023-10-22 DIAGNOSIS — R9431 Abnormal electrocardiogram [ECG] [EKG]: Secondary | ICD-10-CM | POA: Diagnosis not present

## 2023-10-22 DIAGNOSIS — I4891 Unspecified atrial fibrillation: Secondary | ICD-10-CM | POA: Diagnosis present

## 2023-10-22 DIAGNOSIS — F419 Anxiety disorder, unspecified: Secondary | ICD-10-CM | POA: Diagnosis present

## 2023-10-22 DIAGNOSIS — G4733 Obstructive sleep apnea (adult) (pediatric): Secondary | ICD-10-CM | POA: Diagnosis present

## 2023-10-22 DIAGNOSIS — R7303 Prediabetes: Secondary | ICD-10-CM | POA: Diagnosis present

## 2023-10-22 DIAGNOSIS — D6832 Hemorrhagic disorder due to extrinsic circulating anticoagulants: Secondary | ICD-10-CM | POA: Diagnosis present

## 2023-10-22 DIAGNOSIS — Z885 Allergy status to narcotic agent status: Secondary | ICD-10-CM

## 2023-10-22 DIAGNOSIS — I10 Essential (primary) hypertension: Secondary | ICD-10-CM | POA: Diagnosis present

## 2023-10-22 DIAGNOSIS — D500 Iron deficiency anemia secondary to blood loss (chronic): Secondary | ICD-10-CM | POA: Diagnosis not present

## 2023-10-22 DIAGNOSIS — Z6841 Body Mass Index (BMI) 40.0 and over, adult: Secondary | ICD-10-CM | POA: Diagnosis not present

## 2023-10-22 DIAGNOSIS — Z9884 Bariatric surgery status: Secondary | ICD-10-CM

## 2023-10-22 DIAGNOSIS — Z888 Allergy status to other drugs, medicaments and biological substances status: Secondary | ICD-10-CM | POA: Diagnosis not present

## 2023-10-22 DIAGNOSIS — I4892 Unspecified atrial flutter: Secondary | ICD-10-CM

## 2023-10-22 DIAGNOSIS — R531 Weakness: Secondary | ICD-10-CM | POA: Diagnosis present

## 2023-10-22 DIAGNOSIS — I959 Hypotension, unspecified: Secondary | ICD-10-CM | POA: Diagnosis present

## 2023-10-22 DIAGNOSIS — F32A Depression, unspecified: Secondary | ICD-10-CM | POA: Diagnosis present

## 2023-10-22 DIAGNOSIS — E66813 Obesity, class 3: Secondary | ICD-10-CM | POA: Diagnosis present

## 2023-10-22 DIAGNOSIS — K219 Gastro-esophageal reflux disease without esophagitis: Secondary | ICD-10-CM | POA: Diagnosis present

## 2023-10-22 DIAGNOSIS — D62 Acute posthemorrhagic anemia: Secondary | ICD-10-CM | POA: Diagnosis present

## 2023-10-22 DIAGNOSIS — Z91148 Patient's other noncompliance with medication regimen for other reason: Secondary | ICD-10-CM | POA: Diagnosis not present

## 2023-10-22 DIAGNOSIS — Z91199 Patient's noncompliance with other medical treatment and regimen due to unspecified reason: Secondary | ICD-10-CM

## 2023-10-22 LAB — COMPREHENSIVE METABOLIC PANEL WITH GFR
ALT: 16 U/L (ref 0–44)
AST: 25 U/L (ref 15–41)
Albumin: 3 g/dL — ABNORMAL LOW (ref 3.5–5.0)
Alkaline Phosphatase: 60 U/L (ref 38–126)
Anion gap: 9 (ref 5–15)
BUN: 22 mg/dL (ref 8–23)
CO2: 20 mmol/L — ABNORMAL LOW (ref 22–32)
Calcium: 8.1 mg/dL — ABNORMAL LOW (ref 8.9–10.3)
Chloride: 106 mmol/L (ref 98–111)
Creatinine, Ser: 0.88 mg/dL (ref 0.61–1.24)
GFR, Estimated: >60 mL/min (ref >60)
Glucose, Bld: 174 mg/dL — ABNORMAL HIGH (ref 70–99)
Potassium: 4.3 mmol/L (ref 3.5–5.1)
Sodium: 135 mmol/L (ref 135–145)
Total Bilirubin: 0.6 mg/dL (ref 0.0–1.2)
Total Protein: 5.8 g/dL — ABNORMAL LOW (ref 6.5–8.1)

## 2023-10-22 LAB — CBC WITH DIFFERENTIAL/PLATELET
Abs Immature Granulocytes: 0.08 10*3/uL — ABNORMAL HIGH (ref 0.00–0.07)
Basophils Absolute: 0.2 10*3/uL — ABNORMAL HIGH (ref 0.0–0.1)
Basophils Relative: 1 %
Eosinophils Absolute: 0.1 10*3/uL (ref 0.0–0.5)
Eosinophils Relative: 0 %
HCT: 28.7 % — ABNORMAL LOW (ref 39.0–52.0)
Hemoglobin: 8.8 g/dL — ABNORMAL LOW (ref 13.0–17.0)
Immature Granulocytes: 1 %
Lymphocytes Relative: 12 %
Lymphs Abs: 2 10*3/uL (ref 0.7–4.0)
MCH: 28.5 pg (ref 26.0–34.0)
MCHC: 30.7 g/dL (ref 30.0–36.0)
MCV: 92.9 fL (ref 80.0–100.0)
Monocytes Absolute: 1.2 10*3/uL — ABNORMAL HIGH (ref 0.1–1.0)
Monocytes Relative: 7 %
Neutro Abs: 13.7 10*3/uL — ABNORMAL HIGH (ref 1.7–7.7)
Neutrophils Relative %: 79 %
Platelets: 419 10*3/uL — ABNORMAL HIGH (ref 150–400)
RBC: 3.09 MIL/uL — ABNORMAL LOW (ref 4.22–5.81)
RDW: 14.7 % (ref 11.5–15.5)
WBC: 17.2 10*3/uL — ABNORMAL HIGH (ref 4.0–10.5)
nRBC: 0 % (ref 0.0–0.2)

## 2023-10-22 LAB — HEMOGLOBIN AND HEMATOCRIT, BLOOD
HCT: 27.4 % — ABNORMAL LOW (ref 39.0–52.0)
Hemoglobin: 8.9 g/dL — ABNORMAL LOW (ref 13.0–17.0)

## 2023-10-22 LAB — PREPARE RBC (CROSSMATCH)

## 2023-10-22 LAB — I-STAT CHEM 8, ED
BUN: 27 mg/dL — ABNORMAL HIGH (ref 8–23)
Calcium, Ion: 1.01 mmol/L — ABNORMAL LOW (ref 1.15–1.40)
Chloride: 107 mmol/L (ref 98–111)
Creatinine, Ser: 0.8 mg/dL (ref 0.61–1.24)
Glucose, Bld: 153 mg/dL — ABNORMAL HIGH (ref 70–99)
HCT: 25 % — ABNORMAL LOW (ref 39.0–52.0)
Hemoglobin: 8.5 g/dL — ABNORMAL LOW (ref 13.0–17.0)
Potassium: 4.5 mmol/L (ref 3.5–5.1)
Sodium: 137 mmol/L (ref 135–145)
TCO2: 23 mmol/L (ref 22–32)

## 2023-10-22 LAB — PROTIME-INR
INR: 1.3 — ABNORMAL HIGH (ref 0.8–1.2)
Prothrombin Time: 16.1 s — ABNORMAL HIGH (ref 11.4–15.2)

## 2023-10-22 LAB — POC OCCULT BLOOD, ED: Fecal Occult Bld: POSITIVE — AB

## 2023-10-22 LAB — MAGNESIUM: Magnesium: 1.8 mg/dL (ref 1.7–2.4)

## 2023-10-22 MED ORDER — PANTOPRAZOLE SODIUM 40 MG IV SOLR
40.0000 mg | Freq: Two times a day (BID) | INTRAVENOUS | Status: DC
  Administered 2023-10-22 – 2023-10-23 (×2): 40 mg via INTRAVENOUS
  Filled 2023-10-22 (×2): qty 10

## 2023-10-22 MED ORDER — SODIUM CHLORIDE 0.9% IV SOLUTION: Freq: Once | INTRAVENOUS | Status: DC

## 2023-10-22 MED ORDER — DICLOFENAC SODIUM 1 % EX GEL
2.0000 g | Freq: Four times a day (QID) | CUTANEOUS | Status: DC | PRN
  Administered 2023-10-23 – 2023-10-29 (×4): 2 g via TOPICAL
  Filled 2023-10-22 (×2): qty 100

## 2023-10-22 MED ORDER — MAGNESIUM SULFATE 2 GM/50ML IV SOLN
2.0000 g | Freq: Once | INTRAVENOUS | Status: AC
  Administered 2023-10-22: 2 g via INTRAVENOUS
  Filled 2023-10-22: qty 50

## 2023-10-22 MED ORDER — ACETAMINOPHEN 325 MG PO TABS
650.0000 mg | ORAL_TABLET | Freq: Four times a day (QID) | ORAL | Status: DC | PRN
  Administered 2023-10-23 – 2023-10-27 (×2): 650 mg via ORAL
  Filled 2023-10-22 (×2): qty 2

## 2023-10-22 MED ORDER — PANTOPRAZOLE SODIUM 40 MG IV SOLR
40.0000 mg | Freq: Once | INTRAVENOUS | Status: AC
  Administered 2023-10-22: 40 mg via INTRAVENOUS
  Filled 2023-10-22: qty 10

## 2023-10-22 MED ORDER — DILTIAZEM HCL 25 MG/5ML IV SOLN
10.0000 mg | Freq: Once | INTRAVENOUS | Status: AC
  Administered 2023-10-22: 10 mg via INTRAVENOUS
  Filled 2023-10-22: qty 5

## 2023-10-22 MED ORDER — ACETAMINOPHEN 650 MG RE SUPP: 650.0000 mg | Freq: Four times a day (QID) | RECTAL | Status: DC | PRN

## 2023-10-22 MED ORDER — CITALOPRAM HYDROBROMIDE 20 MG PO TABS
20.0000 mg | ORAL_TABLET | Freq: Two times a day (BID) | ORAL | Status: DC
  Administered 2023-10-22 – 2023-10-30 (×15): 20 mg via ORAL
  Filled 2023-10-22 (×15): qty 1

## 2023-10-22 MED ORDER — LACTATED RINGERS IV BOLUS
1000.0000 mL | Freq: Once | INTRAVENOUS | Status: AC
  Administered 2023-10-22: 1000 mL via INTRAVENOUS

## 2023-10-22 MED ORDER — SODIUM CHLORIDE 0.9 % IV BOLUS: 1000.0000 mL | Freq: Once | INTRAVENOUS | Status: DC

## 2023-10-22 MED ORDER — ONDANSETRON HCL 4 MG/2ML IJ SOLN: 4.0000 mg | Freq: Four times a day (QID) | INTRAMUSCULAR | Status: DC | PRN

## 2023-10-22 MED ORDER — DILTIAZEM HCL-DEXTROSE 125-5 MG/125ML-% IV SOLN (PREMIX)
5.0000 mg/h | INTRAVENOUS | Status: DC
  Administered 2023-10-22: 15 mg/h via INTRAVENOUS
  Administered 2023-10-22: 5 mg/h via INTRAVENOUS
  Administered 2023-10-23: 12.5 mg/h via INTRAVENOUS
  Administered 2023-10-23 (×2): 15 mg/h via INTRAVENOUS
  Filled 2023-10-22 (×8): qty 125

## 2023-10-22 MED ORDER — ONDANSETRON HCL 4 MG PO TABS: 4.0000 mg | ORAL_TABLET | Freq: Four times a day (QID) | ORAL | Status: DC | PRN

## 2023-10-22 MED ORDER — CALCIUM GLUCONATE-NACL 2-0.675 GM/100ML-% IV SOLN
2.0000 g | Freq: Once | INTRAVENOUS | Status: AC
  Administered 2023-10-22: 2000 mg via INTRAVENOUS
  Filled 2023-10-22: qty 100

## 2023-10-22 NOTE — ED Provider Notes (Signed)
 Patient's care assumed at 3:30 PM.  Patient has been seen by critical care.  Patient is admitted to hospitalist hospitalist request that we consult GI.  I spoke with Dr. Tova Fresh who will see patient for evaluation   Bertel Venard K, PA-C 10/22/23 1612    Auston Blush, MD 10/25/23 1620

## 2023-10-22 NOTE — ED Notes (Signed)
 Awaiting dilt from main pharmacy

## 2023-10-22 NOTE — Consult Note (Signed)
 UNASSIGNED PATIENT Reason for Consult: Black stools 2 days ago with severe anemia. Referring Physician: [facility] hospitalist.  Cody Hampton is an 63 y.o. male.  HPI: Patient is a 63 year old white male who was recently hospitalized at [facility] with similar complaints when he underwent an EGD on 10/09/2023 was noted to have postoperative changes consistent with a Roux-en-Y but no acute source of bleeding was identified.  Patient was stabilized and sent home after discussion with cardiology his Eliquis was resumed.  A colonoscopy was not done as the patient had an attempted colonoscopy done by Dr. Delynn Flavin at [facility] and it was almost impossible according to the description of the procedure to get to the colon as he has a very redundant colon therefore a virtual colonoscopy was done thereafter to complete the evaluation.  Patient claims he was in his usual state of health till he did notice some black stools couple of days ago.  He did not have a BM yesterday.  This morning he felt his heart was racing which prompted him to come to the emergency room.  He was found to have atrial flutter with rapid ventricular response and was started on diltiazem drip.  He denies having any bright red bleeding per rectum but has noted melenic stools as mentioned above.  Denies having any nausea or vomiting or abdominal pain.  He is on Eliquis for atrial flutter. His last Eliquis dose of Eliquis was at 9 PM last night  Past Medical History:  Diagnosis Date   Arthritis    in knees   Depression    Hypertension    Obesity    Severe sleep apnea    Past Surgical History:  Procedure Laterality Date   ESOPHAGOGASTRODUODENOSCOPY Left 10/09/2023   Procedure: EGD (ESOPHAGOGASTRODUODENOSCOPY);  Surgeon: Jeani Hawking, MD;  Location: [facility];  Service: Gastroenterology;  Laterality: Left;  blood in stool   History reviewed. No pertinent family history.  Social History:  reports that he has  never smoked. He has never used smokeless tobacco. He reports that he does not currently use alcohol. He reports that he does not use drugs.  Allergies:  Allergies  Allergen Reactions   Metoprolol Anxiety, Hypertension, Nausea Only, Other (See Comments), Photosensitivity and Shortness Of Breath   Duloxetine Other (See Comments)    Made mood worse.   Gabapentin Diarrhea and Other (See Comments)    "Not tolerated well."   Hydrocodone Other (See Comments)    "Made me feel odd in my head."    Medications: I have reviewed the patient's current medications. Prior to Admission: (Not in a hospital admission)  Scheduled:  sodium chloride   Intravenous Once   citalopram  20 mg Oral BID   pantoprazole (PROTONIX) IV  40 mg Intravenous Q12H   Continuous:  diltiazem (CARDIZEM) infusion 15 mg/hr (10/22/23 1703)   YQI:[id number] **OR** acetaminophen, diclofenac Sodium, ondansetron **OR** ondansetron (ZOFRAN) IV  Results for orders placed or performed during the hospital encounter of 10/22/23 (from the past 48 hours)  CBC with Differential     Status: Abnormal   Collection Time: 10/22/23  1:07 PM  Result Value Ref Range   WBC 17.2 (H) 4.0 - 10.5 K/uL   RBC 3.09 (L) 4.22 - 5.81 MIL/uL   Hemoglobin 8.8 (L) 13.0 - 17.0 g/dL   HCT 64.3 (L) 32.9 - 51.8 %   MCV 92.9 80.0 - 100.0 fL   MCH 28.5 26.0 - 34.0 pg   MCHC 30.7 30.0 -  36.0 g/dL   RDW 16.1 09.6 - 04.5 %   Platelets 419 (H) 150 - 400 K/uL   nRBC 0.0 0.0 - 0.2 %   Neutrophils Relative % 79 %   Neutro Abs 13.7 (H) 1.7 - 7.7 K/uL   Lymphocytes Relative 12 %   Lymphs Abs 2.0 0.7 - 4.0 K/uL   Monocytes Relative 7 %   Monocytes Absolute 1.2 (H) 0.1 - 1.0 K/uL   Eosinophils Relative 0 %   Eosinophils Absolute 0.1 0.0 - 0.5 K/uL   Basophils Relative 1 %   Basophils Absolute 0.2 (H) 0.0 - 0.1 K/uL   Immature Granulocytes 1 %   Abs Immature Granulocytes 0.08 (H) 0.00 - 0.07 K/uL    Comment: Performed at [facility], [address], [place], [place] [address]  Protime-INR     Status: Abnormal   Collection Time: 10/22/23  1:07 PM  Result Value Ref Range   Prothrombin Time 16.1 (H) 11.4 - 15.2 seconds   INR 1.3 (H) 0.8 - 1.2    Comment: (NOTE) INR goal varies based on device and disease states. Performed at [facility], [address] [place], [place] [address]   Type and screen [facility]     Status: None (Preliminary result)   Collection Time: 10/22/23  1:41 PM  Result Value Ref Range   ABO/RH(D) O NEG    Antibody Screen NEG    Sample Expiration 10/25/2023,2359    Unit Number [id number]    Blood Component Type RBC LR PHER2    Unit division 00    Status of Unit ISSUED    Transfusion Status OK TO TRANSFUSE    Crossmatch Result      Compatible Performed at [facility], [address], [place], [place] [address]   POC occult blood, ED Provider will collect     Status: Abnormal   Collection Time: 10/22/23  1:50 PM  Result Value Ref Range   Fecal Occult Bld POSITIVE (A) NEGATIVE  I-stat chem 8, ED (not at [facility], [facility] or [facility])     Status: Abnormal   Collection Time: 10/22/23  1:51 PM  Result Value Ref Range   Sodium 137 135 - 145 mmol/L   Potassium 4.5 3.5 - 5.1 mmol/L   Chloride 107 98 - 111 mmol/L   BUN 27 (H) 8 - 23 mg/dL   Creatinine, Ser 2.95 0.61 - 1.24 mg/dL   Glucose, Bld 284 (H) 70 - 99 mg/dL    Comment: Glucose reference range applies only to samples taken after fasting for at least 8 hours.   Calcium, Ion 1.01 (L) 1.15 - 1.40 mmol/L   TCO2 23 22 - 32 mmol/L   Hemoglobin 8.5 (L) 13.0 - 17.0 g/dL   HCT 13.2 (L) 44.0 - 10.2 %  Prepare RBC (crossmatch)     Status: None   Collection Time: 10/22/23  2:00 PM  Result Value Ref Range   Order Confirmation      ORDER PROCESSED BY BLOOD BANK Performed at [facility], [address], [place], [place] [address]     No results found.  Review of Systems  Constitutional:  Positive for fatigue. Negative for  activity change, appetite change, chills, diaphoresis, fever and unexpected weight change.  HENT: Negative.    Eyes: Negative.   Respiratory:  Positive for shortness of breath. Negative for apnea, cough, choking, chest tightness, wheezing and stridor.   Cardiovascular:  Positive  for palpitations and leg swelling. Negative for chest pain.  Gastrointestinal:  Positive for anal bleeding and blood in stool. Negative for abdominal distention, abdominal pain, constipation, diarrhea and nausea.  Endocrine: Negative.   Genitourinary: Negative.   Musculoskeletal:  Positive for arthralgias and myalgias.  Allergic/Immunologic: Negative.   Neurological:  Positive for weakness.  Hematological: Negative.   Psychiatric/Behavioral: Negative.     Blood pressure 112/74, pulse (!) 143, temperature 99.2 F (37.3 C), temperature source Oral, resp. rate 16, height 5\' 10"  (1.778 m), weight (!) 190.1 kg, SpO2 100%. Physical Exam Constitutional:      General: He is not in acute distress.    Appearance: He is obese. He is not toxic-appearing.  HENT:     Head: Normocephalic and atraumatic.  Eyes:     Extraocular Movements: Extraocular movements intact.     Pupils: Pupils are equal, round, and reactive to light.  Cardiovascular:     Rate and Rhythm: Tachycardia present. Rhythm irregular.  Pulmonary:     Effort: Pulmonary effort is normal.     Breath sounds: Normal breath sounds.  Abdominal:     General: There is no distension.     Palpations: Abdomen is soft.     Tenderness: There is no abdominal tenderness. There is no guarding.  Musculoskeletal:     Cervical back: Neck supple.  Skin:    General: Skin is warm and dry.  Neurological:     General: No focal deficit present.     Mental Status: He is alert and oriented to person, place, and time.  Psychiatric:        Mood and Affect: Mood normal.        Behavior: Behavior normal.   Assessment/Plan: 1) Melenic stools for 23 days-patient has had a  history of gastric bypass-on Eliquis plans are to do an enteroscopy after couple of days once the Eliquis washes out. 2) Colonic diverticulosis. 3) GERD-on PPI's. 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 5) Atrial flutter with rapid ventricular response-recently started on Eliquis which is on hold-patient is on a Cardizem drip now. 6) HTN. 7) Depression. 8) Morbid obesity BMI is 60.12. 9) Severe obstructive sleep apnea. Tami Falcon 10/22/2023, 4:42 PM

## 2023-10-22 NOTE — ED Notes (Signed)
 MD made aware of temperature rise at bedside. Per MD Consuela Denier, continue transfusion at 165mL/hr and continue to monitor.

## 2023-10-22 NOTE — H&P (Signed)
 History and Physical    Patient: Cody Hampton ZOX:[id number] DOB: 08-14-1960 DOA: 10/22/2023 DOS: the patient was seen and examined on 10/22/2023 PCP: Mattie Marlin, DO  Patient coming from: Home  Chief Complaint:  Chief Complaint  Patient presents with   Weakness   HPI: Cody Hampton is a 63 y.o. male with medical history significant of atrial flutter on eliquis, HTN, HLD, GERD, MGUS, OSA, prediabetes, depression and anxiety, class 3 obesity presenting to the ED with black stools and weakness.  Patient recently admitted in March 2025 for acute GI bleed and atrial flutter/fib with RVR after starting eliquis about 10 days prior to that admission. GI bleed was suspected to be due to initiation of eliquis. EGD at that time did not show any obvious evidence of bleeding and no ulcers noted. He was also started on cardizem drip for his AF with RVR and eventually transitioned to PO cardizem. Patient also had a colonoscopy in 2021 which was incomplete due to having a redundant colon.  Patient states that he was doing fine after hospital discharge and has been compliant with his eliquis. About 3 days ago, he began experiencing black stools. This persisted into Saturday. He did not have a BM yesterday and last took a dose of eliquis yesterday evening. Earlier today, he began experiencing palpitations, generalized weakness, and dizziness with ambulation which is what prompted him to come to the ED. He denies any nausea, vomiting, fevers, chills, chest pain, abdominal pain, urinary changes.   ED course: Vital signs notable for tachycardia in the 140s. EKG showed atrial flutter with RVR. CBC with leukocytosis, reactive thrombocytosis, and hgb 8.8 (down from 13.6 about 12 days ago). INR 1.3. FOBT positive. CMP pending. He was type and screened and currently receiving 1u pRBCs. He was started on cardizem drip for rate control. Also given 1L IVF for rehydration. PCCM was initially consulted by ED provider  given hypotension but felt that patient is stable for medicine admission and BP would improve with IVF and blood transfusion. [facility] hospitalist asked to evaluate patient for admission.  Review of Systems: As mentioned in the history of present illness. All other systems reviewed and are negative. Past Medical History:  Diagnosis Date   Arthritis    in knees   Depression    Hypertension    Obesity    Severe sleep apnea    Past Surgical History:  Procedure Laterality Date   ESOPHAGOGASTRODUODENOSCOPY Left 10/09/2023   Procedure: EGD (ESOPHAGOGASTRODUODENOSCOPY);  Surgeon: Jeani Hawking, MD;  Location: [facility];  Service: Gastroenterology;  Laterality: Left;  blood in stool   Social History:  reports that he has never smoked. He has never used smokeless tobacco. He reports that he does not currently use alcohol. He reports that he does not use drugs.  Allergies  Allergen Reactions   Metoprolol Anxiety, Hypertension, Nausea Only, Other (See Comments), Photosensitivity and Shortness Of Breath   Duloxetine Other (See Comments)    Made mood worse.   Gabapentin Diarrhea and Other (See Comments)    "Not tolerated well."   Hydrocodone Other (See Comments)    "Made me feel odd in my head."    History reviewed. No pertinent family history.  Prior to Admission medications   Medication Sig Start Date End Date Taking? Authorizing Provider  apixaban (ELIQUIS) 5 MG TABS tablet Take 1 tablet (5 mg total) by mouth 2 (two) times daily. 10/18/23  Yes Eustace Pen, PA-C  CALCIUM PO Take 1 tablet by  mouth daily.   Yes [provider]  citalopram (CELEXA) 20 MG tablet Take 1 tablet (20 mg total) by mouth 2 (two) times daily. 10/10/23  Yes Amin, Ankit C, MD  diclofenac Sodium (VOLTAREN ARTHRITIS PAIN) 1 % GEL Apply 2 g topically 4 (four) times daily as needed (pain).   Yes [provider]  diltiazem (CARDIZEM CD) 180 MG 24 hr capsule Take 1 capsule (180 mg total) by mouth 2 (two)  times daily. 10/18/23  Yes Eustace Pen, PA-C  ibuprofen (ADVIL) 200 MG tablet Take 200 mg by mouth every 6 (six) hours as needed for headache, moderate pain (pain score 4-6) or mild pain (pain score 1-3).   Yes [provider]  loperamide (IMODIUM A-D) 2 MG tablet Take 4 mg by mouth daily.   Yes [provider]  Multiple Vitamins-Minerals (BARIATRIC MULTIVITAMINS/IRON) CAPS Take 1 capsule by mouth daily with breakfast.   Yes [provider]  oxyCODONE-acetaminophen (PERCOCET/ROXICET) 5-325 MG tablet Take 1 tablet by mouth daily as needed for moderate pain (pain score 4-6) or severe pain (pain score 7-10).   Yes [provider]  pantoprazole (PROTONIX) 40 MG tablet Take 1 tablet (40 mg total) by mouth as needed (for reflux or GERD symptoms). 10/10/23  Yes Amin, Ankit C, MD  polycarbophil (FIBERCON) 625 MG tablet Take 625 mg by mouth daily.   Yes [provider]  testosterone cypionate (DEPOTESTOSTERONE CYPIONATE) 200 MG/ML injection Inject into the muscle every 14 (fourteen) days. 09/14/23  Yes [provider]    Physical Exam: Vitals:   10/22/23 1430 10/22/23 1515 10/22/23 1544 10/22/23 1610  BP: 103/72 114/73 110/88 112/74  Pulse: (!) 141 (!) 140 (!) 140 (!) 143  Resp: 18 17 11 16   Temp:   97.9 F (36.6 C) 99.2 F (37.3 C)  TempSrc:   Oral Oral  SpO2: 100% 100% 100% 100%  Weight:      Height:       Physical Exam Constitutional:      Appearance: He is obese.     Comments: Chronically ill-appearing  HENT:     Head: Normocephalic and atraumatic.     Mouth/Throat:     Mouth: Mucous membranes are dry.     Pharynx: Oropharynx is clear. No oropharyngeal exudate.  Eyes:     General: No scleral icterus.    Extraocular Movements: Extraocular movements intact.     Conjunctiva/sclera: Conjunctivae normal.     Pupils: Pupils are equal, round, and reactive to light.  Cardiovascular:     Rate and Rhythm: Tachycardia present. Rhythm  irregular.     Heart sounds: No murmur heard.    No friction rub. No gallop.     Comments: AF with rates in the 130s-140s. Pulmonary:     Effort: Pulmonary effort is normal. No respiratory distress.     Breath sounds: Normal breath sounds.     Comments: Lung exam limited by habitus but no obvious wheezing, rales, or rhonchi noted. Saturating >92% on RA. Abdominal:     General: Bowel sounds are normal.     Palpations: Abdomen is soft.     Comments: Protuberant abdomen without any tenderness to palpation, guarding, or rigidity noted.  Musculoskeletal:        General: Normal range of motion.     Cervical back: Normal range of motion.     Comments: Trace peripheral edema in bilateral LE.  Skin:    General: Skin is warm and dry.  Coloration: Skin is pale.  Neurological:     General: No focal deficit present.     Mental Status: He is alert and oriented to person, place, and time.  Psychiatric:        Mood and Affect: Mood normal.        Behavior: Behavior normal.     Data Reviewed:  Results are pending, will review when available.    Latest Ref Rng & Units 10/22/2023    1:51 PM 10/22/2023    1:07 PM 10/10/2023    2:52 AM  CBC  WBC 4.0 - 10.5 K/uL  17.2  12.8   Hemoglobin 13.0 - 17.0 g/dL 8.5  8.8  19.1   Hematocrit 39.0 - 52.0 % 25.0  28.7  44.4   Platelets 150 - 400 K/uL  419  394       Latest Ref Rng & Units 10/22/2023    1:51 PM 10/22/2023    1:07 PM 10/10/2023    2:52 AM  CMP  Glucose 70 - 99 mg/dL 478  295  621   BUN 8 - 23 mg/dL 27  22  8    Creatinine 0.61 - 1.24 mg/dL 3.08  6.57  8.46   Sodium 135 - 145 mmol/L 137  135  136   Potassium 3.5 - 5.1 mmol/L 4.5  4.3  4.0   Chloride 98 - 111 mmol/L 107  106  104   CO2 22 - 32 mmol/L  20  25   Calcium 8.9 - 10.3 mg/dL  8.1  8.4   Total Protein 6.5 - 8.1 g/dL  5.8    Total Bilirubin 0.0 - 1.2 mg/dL  0.6    Alkaline Phos 38 - 126 U/L  60    AST 15 - 41 U/L  25    ALT 0 - 44 U/L  16     FOBT - positive  CMP -  pending  INR - 1.3  Assessment and Plan: No notes have been filed under this hospital service. Service: Hospitalist  Atrial flutter with RVR Patient with similar presentation last month.  He began experiencing palpitations, lightheadedness, generalized weakness earlier today after a several day history of black stools.  Found to be in atrial flutter with RVR, suspected to be secondary to GI bleeding.  He was started on IV Cardizem by the ED provider.  Despite this, his rates have remained in the 140s.  He is otherwise hemodynamically stable.  Will hold home Eliquis given GI bleed.  Cardiology has been consulted. Patient not a candidate for ablation or rhythm control due to morbid obesity and medication nonadherence per cardiology.  -cardiology consulted, appreciate assistance -on diltiazem drip -telemetry -SCDs for dvt ppx -admit to inpatient/progressive  Acute blood loss anemia Acute GI bleed in the setting of anticoagulation Patient presenting with several day history of melanic stools concerning for acute GI bleed.  He is on Eliquis with last dose being yesterday evening.  Hemoglobin decreased from 13.6 (12 days ago) to 8.8 today. FOBT positive. GI has been consulted. Patient underwent type and screen and currently receiving 1u pRBC. Per GI, likely low utility for CTA at this time. Plan to allow for eliquis wash out and then perform enteroscopy. -GI consulted, appreciate assistance -NPO -IV protonix BID -getting 1u PRBC -check post-transfusion CBC (discussed with RN who will order) -zofran q6h prn for nausea  Hypotension Patient initially with BP ranging in the 90s-100s/60s, has since improved to 120s-130s/70s with IVF and blood transfusion.  -  trend BP curve, goal MAP >65 -s/p IVF -receiving 1u pRBC  Hypocalcemia -received calcium gluconate 2g IV in ED -trend calcium level  Prediabetes with hyperglycemia Last A1c 6.3% in 08/2023. Not on any medications for this at home. Blood  glucose 178 on CMP today.  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 Inpatient status is judged to be reasonable and necessary in order to provide the required intensity of service to ensure the patient's safety. The patient's presenting symptoms, physical exam findings, and initial radiographic and laboratory data in the context of their chronic comorbidities is felt to place them at high risk for further clinical deterioration. Furthermore, it is not anticipated that the patient will be medically stable for discharge from the hospital within 2 midnights of admission.   * I certify that at the point of admission it is my clinical judgment that the patient will require inpatient hospital care spanning beyond 2 midnights from the point of admission due to high intensity of service, high risk for further deterioration and high frequency of surveillance required.*  Portions of this note were generated with Dragon dictation software. Dictation errors may occur despite best attempts at proofreading.  Author: Kahealani Yankovich H Verlie Hellenbrand, MD 10/22/2023 4:32 PM  For on call review [email or web address].

## 2023-10-22 NOTE — ED Notes (Signed)
 Per [facility] in blood bank, blood to be administered with temperature increase at MD discretion.

## 2023-10-22 NOTE — ED Notes (Signed)
 Pt states he had dark stools in the past. He was advised to stop Eliquis at that time. He was told to resume the Eliquis which he states he started to have dark stools again two days ago.

## 2023-10-22 NOTE — ED Notes (Signed)
 PA made aware of peripheral access issues.

## 2023-10-22 NOTE — ED Notes (Signed)
 MD messaged regarding patient's temperature rise during first 15 minutes of transfusion.

## 2023-10-22 NOTE — ED Triage Notes (Signed)
 Pt bib ems from home c/o weakness. Pt went to use the bathroom but didn't make it d/t feeling sweaty and about to pass out.    Afib/a-flutter 140's non-compliant with medications Cardizem dose missed for three days. Pt states he isn't taking the medication because he feels like it is messing with his heart.    Initial SBP 94 after 200 cc NS 112/63  CO2 24  RA 86 placed on 2L nasal canula CBG 199 T 97.8

## 2023-10-22 NOTE — Consult Note (Addendum)
 Cardiology Consultation   Patient ID: Echo Allsbrook MRN: [id number]; DOB: 02/13/1961  Admit date: 10/22/2023 Date of Consult: 10/22/2023  PCP:  Mattie Marlin, DO   [facility] Providers Cardiologist:  Afib clinic   Patient Profile:   Cody Hampton is a 63 y.o. male with a hx of hypertension, OSA, obesity, gastric bypass, atrial flutter who is being seen 10/22/2023 for the evaluation of GI bleed/anticoagulation at the request of Dr. Rosalia Hammers.  History of Present Illness:   Cody Hampton is a 63 year old male with past medical history noted above.  He has been followed by the A-fib clinic as well as EP after initially being diagnosed with atrial fibrillation in 06/2023.  There is mention of noncompliance previously.  Has been on Eliquis 5 mg twice daily but limited by cost.  Previously reported he stopped the medication as he felt it was affecting his "chemistry".  Echocardiogram 07/2023 with LVEF of 60 to 65%, no regional wall motion abnormality, severely enlarged RV, mildly dilated right atrium.  Seen by EP 09/27/2023 due to atrial flutter with RVR.  Felt to not be a good candidate for ablation secondary to his body habitus and not a candidate for TEE given large body habitus and obstructive breathing pattern.  His Lopressor was increased to 75 mg twice daily but he declined to take secondary to previously reported fatigue.  He was agreeable to starting bisoprolol 5 mg daily.  Felt to not be a candidate for antiarrhythmic therapy due to noncompliance and lack of anticoagulation.  Last seen 10/06/2023 in the ED after presenting with dark tarry stools while on Eliquis.  He was evaluated by Dr. Jacques Navy, fecal occult stool was positive.  He was  treated with IV diltiazem.  Seen by GI and underwent endoscopy with no evidence of obvious bleeding.  Was cleared to resume Eliquis at discharge.  He was transitioned to p.o. diltiazem.   Seen back in the A-fib clinic on 4/10 and was noted to be in  atrial flutter with RVR.  He was continued on diltiazem 120 mg as well as Eliquis 5 mg twice daily with recommendations to proceed with outpatient cardioversion.  Presented to the ED on 4/14 with dark stools and weakness.  Reports he was in the bathroom earlier today became lightheaded and dizzy.  In the ED labs showed sodium 135, potassium 4.3, creatinine 0.88, magnesium 1.8, WBC 17.2, hemoglobin 8.8, Hemoccult positive.  EKG showed atrial flutter with RVR, rate 143 bpm.  BPs were initially soft in the ED and he was seen by PCCM.  He was started on IV fluids, ordered for 1 unit PRBCs and placed on a diltiazem drip.  Admitted to internal medicine for further management.  GI has been consulted with plans for endoscopy once Eliquis washout complete.  Cardiology asked to evaluate.  Of note in talking with patient it sounds like he is continuing to have melenic stools intermittently since leaving the hospital.  Denies any chest pain or shortness of breath.  Past Medical History:  Diagnosis Date   Arthritis    in knees   Depression    Hypertension    Obesity    Severe sleep apnea     Past Surgical History:  Procedure Laterality Date   ESOPHAGOGASTRODUODENOSCOPY Left 10/09/2023   Procedure: EGD (ESOPHAGOGASTRODUODENOSCOPY);  Surgeon: Jeani Hawking, MD;  Location: [facility];  Service: Gastroenterology;  Laterality: Left;  blood in stool     Inpatient Medications: Scheduled Meds:  sodium chloride  Intravenous Once   Continuous Infusions:  calcium gluconate 2,000 mg (10/22/23 1535)   diltiazem (CARDIZEM) infusion 5 mg/hr (10/22/23 1443)   lactated ringers     PRN Meds:   Allergies:    Allergies  Allergen Reactions   Metoprolol Anxiety, Hypertension, Nausea Only, Other (See Comments), Photosensitivity and Shortness Of Breath   Duloxetine Other (See Comments)    Made mood worse.   Gabapentin Diarrhea and Other (See Comments)    "Not tolerated well."   Hydrocodone Other (See  Comments)    "Made me feel odd in my head."    Social History:   Social History   Socioeconomic History   Marital status: Married    Spouse name: Not on file   Number of children: Not on file   Years of education: Not on file   Highest education level: Not on file  Occupational History   Not on file  Tobacco Use   Smoking status: Never   Smokeless tobacco: Never   Tobacco comments:    Never smoked 07/05/23  Substance and Sexual Activity   Alcohol use: Not Currently   Drug use: Never   Sexual activity: Not on file  Other Topics Concern   Not on file  Social History Narrative   Not on file   Social Drivers of Health   Financial Resource Strain: Low Risk  (01/10/2022)   [facility] to [facility] visits prior to 09/09/2022[facility]   Overall Financial Resource Strain (CARDIA)    Difficulty of Paying Living Expenses: Not very hard  Food Insecurity: No Food Insecurity (10/06/2023)   Hunger Vital Sign    Worried About Running Out of Food in the Last Year: Never true    Ran Out of Food in the Last Year: Never true  Transportation Needs: No Transportation Needs (10/06/2023)   PRAPARE - Administrator, Civil Service (Medical): No    Lack of Transportation (Non-Medical): No  Physical Activity: Insufficiently Active (01/10/2022)   [facility], [facility] prior to 09/09/2022., [facility] visits prior to 09/09/2022[facility]   Exercise Vital Sign    Days of Exercise per Week: 3 days    Minutes of Exercise per Session: 10 min  Stress: Stress Concern Present (01/10/2022)   Received from [facility], [facility] visits prior to 09/09/2022., [facility], [facility] visits prior to 09/09/2022.   [employer] of Occupational Health - Occupational Stress  Questionnaire    Feeling of Stress : To some extent  Social Connections: Unknown (01/10/2022)   [facility] prior to 09/09/2022., [facility], [facility] visits prior to 09/09/2022., [facility]   Social Connection and Isolation Panel [NHANES]    Frequency of Communication with Friends and Family: Not on file    Frequency of Social Gatherings with Friends and Family: Never    Attends Religious Services: Never    Database administrator or Organizations: No    Attends Banker Meetings: Never    Marital Status: Married  Catering manager Violence: Not At Risk (10/06/2023)   Humiliation, Afraid, Rape, and Kick questionnaire    Fear of Current or Ex-Partner: No    Emotionally Abused: No    Physically Abused: No    Sexually Abused: No  Family History:   History reviewed. No pertinent family history.   ROS:  Please see the history of present illness.   All other ROS reviewed and negative.     Physical Exam/Data:   Vitals:   10/22/23 1330 10/22/23 1430 10/22/23 1515 10/22/23 1544  BP: 110/72 103/72 114/73 110/88  Pulse: (!) 142 (!) 141 (!) 140 (!) 140  Resp: (!) 26 18 17 11   Temp:    97.9 F (36.6 C)  TempSrc:    Oral  SpO2: 100% 100% 100% 100%  Weight:      Height:        Intake/Output Summary (Last 24 hours) at 10/22/2023 1558 Last data filed at 10/22/2023 1554 Gross per 24 hour  Intake 315 ml  Output --  Net 315 ml      10/22/2023   12:34 PM 10/18/2023   10:31 AM 10/06/2023   12:23 PM  Last 3 Weights  Weight (lbs) 419 lb 419 lb 441 lb 2.3 oz  Weight (kg) 190.057 kg 190.057 kg 200.1 kg     Body mass index is 60.12 kg/m.  General: Morbidly obese HEENT: normal Neck: no JVD Vascular: No carotid bruits; Distal pulses 2+ bilaterally Cardiac:  normal S1, S2; Tachy; no murmur  Lungs:  clear to auscultation bilaterally, no wheezing, rhonchi or rales  Abd: soft, nontender, no hepatomegaly  Ext:  no edema Musculoskeletal:  No deformities, BUE and BLE strength normal and equal Skin: warm and dry  Neuro:  CNs 2-12 intact, no focal abnormalities noted Psych:  Normal affect   EKG:  The EKG was personally reviewed and demonstrates: Atrial flutter with RVR, 143 bpm Telemetry:  Telemetry was personally reviewed and demonstrates: A flutter with rates 130s to 140s  Relevant CV Studies:  Echo: 07/2023  IMPRESSIONS     1. Left ventricular ejection fraction, by estimation, is 60 to 65%. The  left ventricle has normal function. The left ventricle has no regional  wall motion abnormalities. There is mild asymmetric left ventricular  hypertrophy of the septal segment. Left  ventricular diastolic parameters were normal.   2. Right ventricular systolic function is normal. The right ventricular  size is severely enlarged. There is normal pulmonary artery systolic  pressure.   3. Right atrial size was mildly dilated.   4. The mitral valve is normal in structure. No evidence of mitral valve  regurgitation. No evidence of mitral stenosis.   5. The aortic valve is tricuspid. Aortic valve regurgitation is not  visualized. No aortic stenosis is present.   Comparison(s): No prior Echocardiogram.   FINDINGS   Left Ventricle: Left ventricular ejection fraction, by estimation, is 60  to 65%. The left ventricle has normal function. The left ventricle has no  regional wall motion abnormalities. The left ventricular internal cavity  size was normal in size. There is   mild asymmetric left ventricular hypertrophy of the septal segment. Left  ventricular diastolic parameters were normal.   Right Ventricle: The right ventricular size is severely enlarged. No  increase in right ventricular wall thickness. Right ventricular systolic  function is normal. There is normal pulmonary artery systolic pressure.  The tricuspid regurgitant velocity is  2.75 m/s, and with an assumed right atrial pressure of 3  mmHg, the  estimated right ventricular systolic pressure is 33.2 mmHg.   Left Atrium: Left atrial size was normal in size.   Right Atrium: Right atrial size was mildly dilated.   Pericardium: There is no evidence of pericardial effusion.  Mitral Valve: The mitral valve is normal in structure. No evidence of  mitral valve regurgitation. No evidence of mitral valve stenosis.   Tricuspid Valve: The tricuspid valve is normal in structure. Tricuspid  valve regurgitation is not demonstrated. No evidence of tricuspid  stenosis.   Aortic Valve: The aortic valve is tricuspid. Aortic valve regurgitation is  not visualized. No aortic stenosis is present. Aortic valve mean gradient  measures 5.0 mmHg. Aortic valve peak gradient measures 9.5 mmHg. Aortic  valve area, by VTI measures 2.41  cm.   Pulmonic Valve: The pulmonic valve was normal in structure. Pulmonic valve  regurgitation is not visualized. No evidence of pulmonic stenosis.   Aorta: The aortic root is normal in size and structure.   IAS/Shunts: The interatrial septum was not well visualized.   Laboratory Data:  High Sensitivity Troponin:   Recent Labs  Lab 10/06/23 0305  TROPONINIHS 4     Chemistry Recent Labs  Lab 10/22/23 1351  NA 137  K 4.5  CL 107  GLUCOSE 153*  BUN 27*  CREATININE 0.80    No results for input(s): "PROT", "ALBUMIN", "AST", "ALT", "ALKPHOS", "BILITOT" in the last 168 hours. Lipids No results for input(s): "CHOL", "TRIG", "HDL", "LABVLDL", "LDLCALC", "CHOLHDL" in the last 168 hours.  Hematology Recent Labs  Lab 10/22/23 1351  HGB 8.5*  HCT 25.0*   Thyroid No results for input(s): "TSH", "FREET4" in the last 168 hours.  BNPNo results for input(s): "BNP", "PROBNP" in the last 168 hours.  DDimer No results for input(s): "DDIMER" in the last 168 hours.   Radiology/Studies:  No results found.   Assessment and Plan:   Cody Hampton is a 63 y.o. male with a hx of hypertension, OSA,  obesity, atrial flutter who is being seen 10/22/2023 for the evaluation of GI bleed/anticoagulation at the request of Dr. Rosalia Hammers.  Atrial flutter with RVR -- Known history of the same, has been evaluated by EP and felt not to be a good candidate for ablation and antiarrhythmic therapy in the past secondary to his obesity and noncompliance. -- Presents now in atrial flutter with RVR in the setting of acute bleed hemoglobin of 8.8. -- Heart rates in the 140s, has been started on IV diltiazem with blood pressures tolerating currently.  He is overall asymptomatic therefore would like to avoid adding amiodarone at this time given the risk of him being off of anticoagulation.  Reports he has not tolerated metoprolol in the past. -- Continue IV diltiazem, holding Eliquis  Acute blood loss anemia -- Recently admitted for the same and underwent GI workup with endoscopy and was cleared to resume Eliquis. -- now with repeat bleeding, occult positive, hemoglobin 8.8>>8.5 -- Receiving 1 unit PRBCs currently -- GI following with plans for endoscopy once Eliquis washout complete -- IV Protonix  Hypotension -- Initially hypotensive with pressures in the 70s, now has improved with IV fluids and PRBCs  OSA Morbid obesity History of noncompliance  Risk Assessment/Risk Scores:   CHA2DS2-VASc Score = 1   This indicates a 0.6% annual risk of stroke. The patient's score is based upon: CHF History: 0 HTN History: 1 Diabetes History: 0 Stroke History: 0 Vascular Disease History: 0 Age Score: 0 Gender Score: 0    For questions or updates, please contact [facility] Please consult [email or web address] for contact info under    Signed, Laverda Page, NP  10/22/2023 3:58 PM  I have personally seen and examined the patient.  My HPI, Exam,  and assessment and plan are below, independent of the NPP above.  Cody Hampton with atrial flutter and anemia due to a GI bleed, presents with lightheadedness and  bleeding.  He has a known history of atrial fibrillation and atrial flutter, currently experiencing rapid ventricular response (RVR) due to a gastrointestinal (GI) bleed. His hemoglobin levels have dropped from 13.6 to 8.8 and then to 8.5, indicating anemia due to blood loss.  He feels lightheaded, which occurred while he was in the bathroom. He also experienced bleeding, which is worse than a previous episode earlier this month. During that episode, he was evaluated and scoped by a gastroenterologist, but no corrective action was taken at that time.  He has a history of noncompliance with medications and is not a candidate for ablation or rhythm control due to his morbid obesity (BMI of 60) and other health factors. He is also not a candidate for anticoagulation or a Watchman device due to his size and the need for anticoagulation prior to the procedure.  His blood pressure has improved with IV fluids and hemoglobin administration, but he remains concerned about his heart rate and the potential for stroke.  His wife is part of his care team but is bedridden and unable to visit in person  Exam notable for  Gen: mild distress, super morbid obesity   Neck: No JVD Cardiac: No Rubs or Gallops, distant heart sounds, Regular tachycardia, +2 radial pulses Respiratory: Clear to auscultation bilaterally, normal effort, tachypnea GI: Soft, nontenderly distended MS: No edema;  moves all extremities Integument: Skin feels warm Neuro:  At time of evaluation, alert and oriented to person/place/time/situation   In review of his EKGs: one EKG 10/08/23 shows rate controlled AFL; all otheres appear to show AFL RVR Tele: AFL RVR 2:1 conduction  In assessment and plan:   Atrial flutter with rapid ventricular response (RVR) He presents with atrial flutter with RVR, asymptomatic from atrial fibrillation/flutter but experiencing lightheadedness likely due to anemia from a GI bleed. Heart rate is persistently at  142 bpm with 2:1 conduction. Not a candidate for ablation or rhythm control due to morbid obesity and medication noncompliance. Cardioversion scheduled for November 02, 2023, canceled due to GI bleed. Concerned about stroke risk with IV amiodarone. Heart rate compensatory due to anemia, heart function preserved. High stroke risk if IV amiodarone required before anticoagulation clearance. - Continue rate control with IV diltiazem. - Administer IV magnesium (ordered) - Avoid IV amiodarone unless necessary for symptomatic hypotension induced by atrial flutter given stroke risk  Anemia due to gastrointestinal bleed Experiencing anemia secondary to active GI bleed, with hemoglobin dropping from 13.6 to 8.8 and then 8.5. Currently receiving blood transfusion. GI team consulting, procedural intervention planned. Previous similar episodes with recent admission for anemia and GI evaluation. - Continue blood transfusion. - no AC or ASA until cleared by GI  Hypertension Experiences hypertension, worsened by metoprolol or bisoprolol, causing discomfort. Blood pressure improved with IV fluids and hemoglobin administration, initially in the 70s, now in the 120s.   Morbid obesity (BMI 60) Morbid obesity complicates cardiovascular management, ineligible for interventions like the Watchman device, affects blood pressure accuracy, and limits anticoagulation and other interventions.  Noncompliance (query from chart review) Noncompliance with medications complicates management and limits treatment options. - will not rechallenge beta blockers; he notes that he is adherence with his therapy  Will Follow into 10/23/23  Gloriann Larger, MD FASE [facility] Cardiologist [facility]  [facility]  [address] [place], [place]  [place], [place] [address] [phone]  5:35 PM

## 2023-10-22 NOTE — Consult Note (Signed)
 NAME:  Cody Hampton, MRN:  [id number], DOB:  10/03/1960, LOS: 0 ADMISSION DATE:  10/22/2023, CONSULTATION DATE:  10/22/23 REFERRING MD:  EDP CHIEF COMPLAINT:  GI Bleed   History of Present Illness:  Cody Hampton is a 63 year old male with history of obesity, sleep apnea, atrial fibrillation on eliquis, gastric bypass, and MGUS who returns to the ER for dark stools and weakness. He was noted to be in atrial fibrillation with RVR. He is hemoccult positive. Istat hemoglobin is 8.5g/dL where his baseline is around 13.6g/dL on prior admission. He was recently admitted 3/29 to 4/1 for GI bleeding. EGD done 4/1 did not show any abnormalities of the esophagus or stomach.   He denies chest pain, dizziness, nausea or abdominal pain. MAPs ranging 79-85. He has not received any IV fluids. Type and Screen performed, 1 unit PRBCs ordered. He has been started on diltiazem drip for a. Fib with RVR.  CCM consulted for low blood pressures.   Pertinent  Medical History   Past Medical History:  Diagnosis Date   Arthritis    in knees   Depression    Hypertension    Obesity    Severe sleep apnea    Significant Hospital Events: Including procedures, antibiotic start and stop dates in addition to other pertinent events     Interim History / Subjective:  As above  Objective   Blood pressure 103/72, pulse (!) 141, temperature 97.9 F (36.6 C), temperature source Oral, resp. rate 18, height 5\' 10"  (1.778 m), weight (!) 190.1 kg, SpO2 100%.       No intake or output data in the 24 hours ending 10/22/23 1503 Filed Weights   10/22/23 1234  Weight: (!) 190.1 kg    Examination: General: ill appearing male, obese, no acute distress HENT: [place]/AT, moist mucous membranes Lungs: clear to auscultation, no wheezing Cardiovascular: tachycardic, no murmur Abdomen: obese, non-tender, BS+ Extremities: warm, no edema Neuro: alert, oriented, moving all extremities GU: n/a  Resolved Hospital Problem list      Assessment & Plan:  Acute Blood Loss Anemia Gastrointestinal Bleeding in setting of anticoagulation Atrial Fibrillation with RVR Hypocalcemia Generalize Weakness  Plan - Give 1L LR bolus - Give 1 unit PRBC - Trend CBC q6 hours - GI consult recommended, likely distal small bowel lesions given normal EGD earlier this month - hold further anticoagulation - Continue diltiazem drip, consider switching to amiodarone - replete calcium - check magnesium level, replete if less than 2  Patient stable for progressive care unit under [facility]  Best Practice (right click and "Reselect all SmartList Selections" daily)   Diet/type: NPO DVT prophylaxis not indicated Pressure ulcer(s): N/A GI prophylaxis: N/A Lines: N/A Foley:  N/A Code Status:  full code Last date of multidisciplinary goals of care discussion [discussed with patient at bedside today]  Labs   CBC: Recent Labs  Lab 10/22/23 1351  HGB 8.5*  HCT 25.0*    Basic Metabolic Panel: Recent Labs  Lab 10/22/23 1351  NA 137  K 4.5  CL 107  GLUCOSE 153*  BUN 27*  CREATININE 0.80   GFR: Estimated Creatinine Clearance: 158.1 mL/min (by C-G formula based on SCr of 0.8 mg/dL). No results for input(s): "PROCALCITON", "WBC", "LATICACIDVEN" in the last 168 hours.  Liver Function Tests: No results for input(s): "AST", "ALT", "ALKPHOS", "BILITOT", "PROT", "ALBUMIN" in the last 168 hours. No results for input(s): "LIPASE", "AMYLASE" in the last 168 hours. No results for input(s): "AMMONIA" in the last  168 hours.  ABG    Component Value Date/Time   TCO2 23 10/22/2023 1351     Coagulation Profile: No results for input(s): "INR", "PROTIME" in the last 168 hours.  Cardiac Enzymes: No results for input(s): "CKTOTAL", "CKMB", "CKMBINDEX", "TROPONINI" in the last 168 hours.  HbA1C: No results found for: "HGBA1C"  CBG: No results for input(s): "GLUCAP" in the last 168 hours.  Review of Systems:   Review of Systems   Constitutional:  Negative for chills, fever, malaise/fatigue and weight loss.  HENT:  Negative for congestion, sinus pain and sore throat.   Eyes: Negative.   Respiratory:  Negative for cough, hemoptysis, sputum production, shortness of breath and wheezing.   Cardiovascular:  Negative for chest pain, palpitations, orthopnea, claudication and leg swelling.  Gastrointestinal:  Positive for constipation and melena. Negative for abdominal pain, heartburn, nausea and vomiting.  Genitourinary: Negative.   Musculoskeletal:  Negative for joint pain and myalgias.  Skin:  Negative for rash.  Neurological:  Positive for weakness.  Endo/Heme/Allergies: Negative.   Psychiatric/Behavioral: Negative.     Past Medical History:  He,  has a past medical history of Arthritis, Depression, Hypertension, Obesity, and Severe sleep apnea.   Surgical History:   Past Surgical History:  Procedure Laterality Date   ESOPHAGOGASTRODUODENOSCOPY Left 10/09/2023   Procedure: EGD (ESOPHAGOGASTRODUODENOSCOPY);  Surgeon: Alvis Jourdain, MD;  Location: [facility];  Service: Gastroenterology;  Laterality: Left;  blood in stool     Social History:   reports that he has never smoked. He has never used smokeless tobacco. He reports that he does not currently use alcohol. He reports that he does not use drugs.   Family History:  His family history is not on file.   Allergies Allergies  Allergen Reactions   Metoprolol Anxiety, Hypertension, Nausea Only, Other (See Comments), Photosensitivity and Shortness Of Breath   Duloxetine Other (See Comments)    Made mood worse.   Gabapentin Diarrhea and Other (See Comments)    "Not tolerated well."   Hydrocodone Other (See Comments)    "Made me feel odd in my head."     Home Medications  Prior to Admission medications   Medication Sig Start Date End Date Taking? Authorizing Provider  apixaban (ELIQUIS) 5 MG TABS tablet Take 1 tablet (5 mg total) by mouth 2 (two) times  daily. 10/18/23  Yes Nathanel Bal, PA-C  CALCIUM PO Take 1 tablet by mouth daily.   Yes [provider]  citalopram (CELEXA) 20 MG tablet Take 1 tablet (20 mg total) by mouth 2 (two) times daily. 10/10/23  Yes Amin, Ankit C, MD  diclofenac Sodium (VOLTAREN ARTHRITIS PAIN) 1 % GEL Apply 2 g topically 4 (four) times daily as needed (pain).   Yes [provider]  diltiazem (CARDIZEM CD) 180 MG 24 hr capsule Take 1 capsule (180 mg total) by mouth 2 (two) times daily. 10/18/23  Yes Nathanel Bal, PA-C  ibuprofen (ADVIL) 200 MG tablet Take 200 mg by mouth every 6 (six) hours as needed for headache, moderate pain (pain score 4-6) or mild pain (pain score 1-3).   Yes [provider]  loperamide (IMODIUM A-D) 2 MG tablet Take 4 mg by mouth daily.   Yes [provider]  Multiple Vitamins-Minerals (BARIATRIC MULTIVITAMINS/IRON) CAPS Take 1 capsule by mouth daily with breakfast.   Yes [provider]  oxyCODONE-acetaminophen (PERCOCET/ROXICET) 5-325 MG tablet Take 1 tablet by mouth daily as needed for moderate pain (pain score 4-6) or severe  pain (pain score 7-10).   Yes [provider]  pantoprazole (PROTONIX) 40 MG tablet Take 1 tablet (40 mg total) by mouth as needed (for reflux or GERD symptoms). 10/10/23  Yes Amin, Ankit C, MD  polycarbophil (FIBERCON) 625 MG tablet Take 625 mg by mouth daily.   Yes [provider]  testosterone cypionate (DEPOTESTOSTERONE CYPIONATE) 200 MG/ML injection Inject into the muscle every 14 (fourteen) days. 09/14/23  Yes [provider]     Critical care time: n/a    Duaine German, MD [facility] Office: [phone]   See Amion for personal pager PCCM on call pager [phone] until 7pm. Please call Elink 7p-7a. [phone]

## 2023-10-22 NOTE — ED Notes (Signed)
 Lab called regarding specimens without result since 1330

## 2023-10-22 NOTE — ED Notes (Signed)
 Consent for blood signed by patient and witnessed by this RN. Placed at bedside

## 2023-10-22 NOTE — ED Provider Notes (Signed)
 [place] EMERGENCY DEPARTMENT AT [facility] Provider Note   CSN: [id number] Arrival date & time: 10/22/23  1230     History  Chief Complaint  Patient presents with   Weakness   HPI Cody Hampton is a 63 y.o. male with h/o atrial flutter/afib on Eliquis, hypertension presenting for generalized weakness.  He states that started about a week ago.  Also in worsening of black tarry stools that he noticed about 4 days ago.  States he is short of breath but no chest pain.  Shortness of breath worse with exertion.  States he is also extremely fatigued and noticed that his heart rate has been much faster at home.  States he is compliant taking his diltiazem and his Eliquis.  Recent admission for GI bleed.   Weakness      Home Medications Prior to Admission medications   Medication Sig Start Date End Date Taking? Authorizing Provider  apixaban (ELIQUIS) 5 MG TABS tablet Take 1 tablet (5 mg total) by mouth 2 (two) times daily. 10/18/23  Yes Eustace Pen, PA-C  CALCIUM PO Take 1 tablet by mouth daily.   Yes [provider]  citalopram (CELEXA) 20 MG tablet Take 1 tablet (20 mg total) by mouth 2 (two) times daily. 10/10/23  Yes Amin, Ankit C, MD  diclofenac Sodium (VOLTAREN ARTHRITIS PAIN) 1 % GEL Apply 2 g topically 4 (four) times daily as needed (pain).   Yes [provider]  diltiazem (CARDIZEM CD) 180 MG 24 hr capsule Take 1 capsule (180 mg total) by mouth 2 (two) times daily. 10/18/23  Yes Eustace Pen, PA-C  ibuprofen (ADVIL) 200 MG tablet Take 200 mg by mouth every 6 (six) hours as needed for headache, moderate pain (pain score 4-6) or mild pain (pain score 1-3).   Yes [provider]  loperamide (IMODIUM A-D) 2 MG tablet Take 4 mg by mouth daily.   Yes [provider]  Multiple Vitamins-Minerals (BARIATRIC MULTIVITAMINS/IRON) CAPS Take 1 capsule by mouth daily with breakfast.   Yes [provider]  oxyCODONE-acetaminophen  (PERCOCET/ROXICET) 5-325 MG tablet Take 1 tablet by mouth daily as needed for moderate pain (pain score 4-6) or severe pain (pain score 7-10).   Yes [provider]  pantoprazole (PROTONIX) 40 MG tablet Take 1 tablet (40 mg total) by mouth as needed (for reflux or GERD symptoms). 10/10/23  Yes Amin, Ankit C, MD  polycarbophil (FIBERCON) 625 MG tablet Take 625 mg by mouth daily.   Yes [provider]  testosterone cypionate (DEPOTESTOSTERONE CYPIONATE) 200 MG/ML injection Inject into the muscle every 14 (fourteen) days. 09/14/23  Yes [provider]      Allergies    Metoprolol, Duloxetine, Gabapentin, and Hydrocodone    Review of Systems   Review of Systems  Neurological:  Positive for weakness.    Physical Exam Updated Vital Signs BP 110/88   Pulse (!) 140   Temp 97.9 F (36.6 C) (Oral)   Resp 11   Ht 5\' 10"  (1.778 m)   Wt (!) 190.1 kg   SpO2 100%   BMI 60.12 kg/m  Physical Exam Exam conducted with a chaperone present.  Constitutional:      Appearance: Normal appearance.  HENT:     Head: Normocephalic.     Nose: Nose normal.  Eyes:     Conjunctiva/sclera: Conjunctivae normal.  Cardiovascular:     Rate and Rhythm: Tachycardia present. Rhythm irregular.  Pulmonary:     Effort:  Pulmonary effort is normal.  Genitourinary:    Rectum: Guaiac result positive. No tenderness.     Comments: Anal region covered with black appearing stool Skin:    Coloration: Skin is pale.  Neurological:     Mental Status: He is alert.  Psychiatric:        Mood and Affect: Mood normal.     ED Results / Procedures / Treatments   Labs (all labs ordered are listed, but only abnormal results are displayed) Labs Reviewed  I-STAT CHEM 8, ED - Abnormal; Notable for the following components:      Result Value   BUN 27 (*)    Glucose, Bld 153 (*)    Calcium, Ion 1.01 (*)    Hemoglobin 8.5 (*)    HCT 25.0 (*)    All other components within normal limits  POC OCCULT  BLOOD, ED - Abnormal; Notable for the following components:   Fecal Occult Bld POSITIVE (*)    All other components within normal limits  COMPREHENSIVE METABOLIC PANEL WITH GFR  CBC WITH DIFFERENTIAL/PLATELET  PROTIME-INR  MAGNESIUM  TYPE AND SCREEN  PREPARE RBC (CROSSMATCH)    EKG EKG Interpretation Date/Time:  Monday October 22 2023 12:39:02 EDT Ventricular Rate:  143 PR Interval:    QRS Duration:  124 QT Interval:  345 QTC Calculation: 533 R Axis:   -63  Text Interpretation: Atrial flutter with varied AV block, Nonspecific IVCD with LAD Inferior infarct, recent Abnormal lateral Q waves Probable anterior infarct, age indeterminate Confirmed by Margarita Grizzle [phone]) on 10/22/2023 1:08:45 PM  Radiology No results found.  Procedures .Critical Care  Performed by: Gareth Eagle, PA-C Authorized by: Gareth Eagle, PA-C   Critical care provider statement:    Critical care time (minutes):  30   Critical care was necessary to treat or prevent imminent or life-threatening deterioration of the following conditions: Persistent flutter requiring diltiazem infusion, symptomatic GI bleed requiring blood transfusion.   Critical care was time spent personally by me on the following activities:  Development of treatment plan with patient or surrogate, discussions with consultants, evaluation of patient's response to treatment, examination of patient, ordering and review of laboratory studies, ordering and review of radiographic studies, ordering and performing treatments and interventions, pulse oximetry, re-evaluation of patient's condition and review of old charts     Medications Ordered in ED Medications  diltiazem (CARDIZEM) 125 mg in dextrose 5% 125 mL (1 mg/mL) infusion (5 mg/hr Intravenous New Bag/Given 10/22/23 1443)  0.9 %  sodium chloride infusion (Manually program via Guardrails IV Fluids) (0 mLs Intravenous Hold 10/22/23 1446)  calcium gluconate 2 g/ 100 mL sodium chloride IVPB  (2,000 mg Intravenous New Bag/Given 10/22/23 1535)  lactated ringers bolus 1,000 mL (has no administration in time range)  citalopram (CELEXA) tablet 20 mg (has no administration in time range)  pantoprazole (PROTONIX) injection 40 mg (has no administration in time range)  diltiazem (CARDIZEM) injection 10 mg (10 mg Intravenous Given 10/22/23 1337)  pantoprazole (PROTONIX) injection 40 mg (40 mg Intravenous Given 10/22/23 1343)    ED Course/ Medical Decision Making/ A&P Clinical Course as of 10/22/23 1603  Mon Oct 22, 2023  1411 BMI (Calculated): 60.12 [JR]  1421 BMI (Calculated): 60.12 [JR]    Clinical Course User Index [JR] Gareth Eagle, PA-C  Medical Decision Making Amount and/or Complexity of Data Reviewed Labs: ordered.  Risk Prescription drug management.   Initial Impression and Ddx 63 yo well appearing male presenting for generalized weakness.  Exam notable for irregular tachycardia and pale.  Ddx includes A-fib or a flutter with RVR, GI bleed, symptomatic anemia, sepsis, PE, other. Patient PMH that increases complexity of ED encounter:  h/o atrial flutter/afib on Eliquis, hypertension, recent admission for GI bleed  Interpretation of Diagnostics - I independent reviewed and interpreted the labs as followed: anemia (8.5, last was 13.6, 12 days ago), hemoccult positive  - I personally reviewed and interpreted EKG which revealed atrial flutter  Patient Reassessment and Ultimate Disposition/Management Reassessment, patient remained stable but persistently tachycardic.  Suspect GI bleed is a precipitant for his tachycardia.  Treated with IV Protonix started diltiazem drip and ordered 1 unit of blood.  Dr. Diania Fortes of ICU was available to evaluate the patient in person and advised that he would be appropriate for the floor at this time provided that he remains hemodynamically stable.  Admitted to hospital service with Dr. Consuela Denier.  Awaiting GI recs  at this time.  PA Lynnie Saucier is planning to discuss patient with GI and will close the loop with the hospital team with regards to their plan for him.  Patient management required discussion with the following services or consulting groups:  Hospitalist Service and Intensivist Service  Complexity of Problems Addressed Acute complicated illness or Injury  Additional Data Reviewed and Analyzed Further history obtained from: Further history from spouse/family member, Past medical history and medications listed in the EMR, and Prior ED visit notes  Patient Encounter Risk Assessment Consideration of hospitalization         Final Clinical Impression(s) / ED Diagnoses Final diagnoses:  Weakness    Rx / DC Orders ED Discharge Orders          Ordered    Amb referral to AFIB Clinic        10/22/23 1310              Kerim, [place], PA-C 10/22/23 1603    Auston Blush, MD 10/25/23 1620

## 2023-10-23 ENCOUNTER — Institutional Professional Consult (permissible substitution): Payer: Self-pay | Admitting: Neurology

## 2023-10-23 DIAGNOSIS — I4891 Unspecified atrial fibrillation: Secondary | ICD-10-CM | POA: Diagnosis not present

## 2023-10-23 DIAGNOSIS — I4892 Unspecified atrial flutter: Secondary | ICD-10-CM | POA: Diagnosis not present

## 2023-10-23 DIAGNOSIS — D62 Acute posthemorrhagic anemia: Secondary | ICD-10-CM | POA: Diagnosis not present

## 2023-10-23 DIAGNOSIS — K922 Gastrointestinal hemorrhage, unspecified: Secondary | ICD-10-CM | POA: Diagnosis not present

## 2023-10-23 LAB — URINALYSIS, ROUTINE W REFLEX MICROSCOPIC
Bilirubin Urine: NEGATIVE
Glucose, UA: NEGATIVE mg/dL
Hgb urine dipstick: NEGATIVE
Ketones, ur: 5 mg/dL — AB
Leukocytes,Ua: NEGATIVE
Nitrite: NEGATIVE
Protein, ur: NEGATIVE mg/dL
Specific Gravity, Urine: 1.027 (ref 1.005–1.030)
pH: 5 (ref 5.0–8.0)

## 2023-10-23 LAB — CBC
HCT: 27.3 % — ABNORMAL LOW (ref 39.0–52.0)
HCT: 27.8 % — ABNORMAL LOW (ref 39.0–52.0)
Hemoglobin: 8.8 g/dL — ABNORMAL LOW (ref 13.0–17.0)
Hemoglobin: 8.8 g/dL — ABNORMAL LOW (ref 13.0–17.0)
MCH: 28.7 pg (ref 26.0–34.0)
MCH: 28.3 pg (ref 26.0–34.0)
MCHC: 32.2 g/dL (ref 30.0–36.0)
MCHC: 31.7 g/dL (ref 30.0–36.0)
MCV: 88.9 fL (ref 80.0–100.0)
MCV: 89.4 fL (ref 80.0–100.0)
Platelets: 391 10*3/uL (ref 150–400)
Platelets: 415 10*3/uL — ABNORMAL HIGH (ref 150–400)
RBC: 3.07 MIL/uL — ABNORMAL LOW (ref 4.22–5.81)
RBC: 3.11 MIL/uL — ABNORMAL LOW (ref 4.22–5.81)
RDW: 14.6 % (ref 11.5–15.5)
RDW: 14.9 % (ref 11.5–15.5)
WBC: 15.7 10*3/uL — ABNORMAL HIGH (ref 4.0–10.5)
WBC: 14.5 10*3/uL — ABNORMAL HIGH (ref 4.0–10.5)
nRBC: 0 % (ref 0.0–0.2)
nRBC: 0 % (ref 0.0–0.2)

## 2023-10-23 LAB — BASIC METABOLIC PANEL WITH GFR
Anion gap: 7 (ref 5–15)
BUN: 17 mg/dL (ref 8–23)
CO2: 21 mmol/L — ABNORMAL LOW (ref 22–32)
Calcium: 8 mg/dL — ABNORMAL LOW (ref 8.9–10.3)
Chloride: 108 mmol/L (ref 98–111)
Creatinine, Ser: 0.76 mg/dL (ref 0.61–1.24)
GFR, Estimated: >60 mL/min (ref >60)
Glucose, Bld: 131 mg/dL — ABNORMAL HIGH (ref 70–99)
Potassium: 4 mmol/L (ref 3.5–5.1)
Sodium: 136 mmol/L (ref 135–145)

## 2023-10-23 LAB — TYPE AND SCREEN
ABO/RH(D): O NEG
Antibody Screen: NEGATIVE
Unit division: 0

## 2023-10-23 LAB — BPAM RBC
Blood Product Expiration Date: 202504292359
ISSUE DATE / TIME: 202504141547
Unit Type and Rh: 9500

## 2023-10-23 LAB — GLUCOSE, CAPILLARY: Glucose-Capillary: 133 mg/dL — ABNORMAL HIGH (ref 70–99)

## 2023-10-23 MED ORDER — DIGOXIN 125 MCG PO TABS
0.1250 mg | ORAL_TABLET | Freq: Every day | ORAL | Status: DC
  Administered 2023-10-23 – 2023-10-29 (×7): 0.125 mg via ORAL
  Filled 2023-10-23 (×7): qty 1

## 2023-10-23 MED ORDER — PANTOPRAZOLE SODIUM 40 MG PO TBEC
40.0000 mg | DELAYED_RELEASE_TABLET | Freq: Every day | ORAL | Status: DC
  Administered 2023-10-23 – 2023-10-30 (×7): 40 mg via ORAL
  Filled 2023-10-23: qty 1
  Filled 2023-10-23: qty 2
  Filled 2023-10-23 (×2): qty 1
  Filled 2023-10-23: qty 2
  Filled 2023-10-23 (×2): qty 1

## 2023-10-23 NOTE — TOC CM/SW Note (Signed)
 Transition of Care [facility]) - Inpatient Brief Assessment   Patient Details  Name: Cody Hampton MRN: [id number] Date of Birth: 1961/06/21  Transition of [facility]) CM/SW Contact:    Arron Big, LCSWA Phone Number: 10/23/2023, 3:39 PM   Clinical Narrative: Patient is from home with spouse and his children - son listed in chart was at bedside today. Patient states that at DC his wife can potentially arrange transportation. CSW expressed to patient that if wife is unable to provide transportation to let let TOC know. Patient has a PCP and uses [facility]. Patient reports the following DME: cane and rolator. Patient states he has had HH in the past but did not know the name of the agency. According to [facility], [facility] had [facility].   TOC will continue to follow.     Transition of Care Asessment: Insurance and Status: Insurance coverage has been reviewed Patient has primary care physician: Yes Home environment has been reviewed: home with spouse Prior level of function:: independent Prior/Current Home Services: No current home services Social Drivers of Health Review: SDOH reviewed no interventions necessary Readmission risk has been reviewed: Yes Transition of care needs: no transition of care needs at this time

## 2023-10-23 NOTE — Progress Notes (Signed)
   10/23/23 2300  BiPAP/CPAP/SIPAP  Reason BIPAP/CPAP not in use Non-compliant (Pt says he doesnt want to wear it tonight.)

## 2023-10-23 NOTE — Plan of Care (Signed)

## 2023-10-23 NOTE — Progress Notes (Signed)
 Subjective: No complaints.  Objective: Vital signs in last 24 hours: Temp:  [97.6 F (36.4 C)-99.2 F (37.3 C)] 97.6 F (36.4 C) (04/15 1200) Pulse Rate:  [117-143] 117 (04/15 0444) Resp:  [11-21] 20 (04/15 1200) BP: (98-137)/(45-90) 137/71 (04/15 1200) SpO2:  [92 %-100 %] 96 % (04/15 1200) Weight:  [186 kg] 186 kg (04/15 0444) Last BM Date : 10/22/23  Intake/Output from previous day: 04/14 0701 - 04/15 0700 In: 1874.3 [P.O.:960; I.V.:225.3; Blood:689] Out: 300 [Urine:300] Intake/Output this shift: No intake/output data recorded.  General appearance: alert and no distress GI: morbidly obese, soft  Lab Results: Recent Labs    10/22/23 1307 10/22/23 1351 10/22/23 2022 10/23/23 0321  WBC 17.2*  --   --  15.7*  HGB 8.8* 8.5* 8.9* 8.8*  HCT 28.7* 25.0* 27.4* 27.3*  PLT 419*  --   --  391   BMET Recent Labs    10/22/23 1307 10/22/23 1351 10/23/23 0321  NA 135 137 136  K 4.3 4.5 4.0  CL 106 107 108  CO2 20*  --  21*  GLUCOSE 174* 153* 131*  BUN 22 27* 17  CREATININE 0.88 0.80 0.76  CALCIUM 8.1*  --  8.0*   LFT Recent Labs    10/22/23 1307  PROT 5.8*  ALBUMIN 3.0*  AST 25  ALT 16  ALKPHOS 60  BILITOT 0.6   PT/INR Recent Labs    10/22/23 1307  LABPROT 16.1*  INR 1.3*   Hepatitis Panel No results for input(s): "HEPBSAG", "HCVAB", "HEPAIGM", "HEPBIGM" in the last 72 hours. C-Diff No results for input(s): "CDIFFTOX" in the last 72 hours. Fecal Lactopherrin No results for input(s): "FECLLACTOFRN" in the last 72 hours.  Studies/Results: No results found.  Medications: Scheduled:  sodium chloride   Intravenous Once   citalopram  20 mg Oral BID   digoxin  0.125 mg Oral Daily   pantoprazole (PROTONIX) IV  40 mg Intravenous Q12H   Continuous:  diltiazem (CARDIZEM) infusion 15 mg/hr (10/23/23 1430)    Assessment/Plan: 1) Melena. 2) IDA.   The patient is stable.  His recent EGD was negative for any overt bleeding site.  The patient had a  short afferent limb (blind pouch).  A VCE is appropriate for further evaluation.  Plan: 1) VCE tomorrow. 2) Follow HGB and transfuse as necessary. 3) Transition to pantoprazole 40 mg every day.  LOS: 1 day   Tushar Enns D 10/23/2023, 3:25 PM

## 2023-10-23 NOTE — Progress Notes (Signed)
 Progress Note  Patient Name: Cody Hampton Date of Encounter: 10/23/2023 Primary Cardiologist: Parke Poisson, MD   Subjective   Overnight heart rates have decreased. Patient notes no CP, SOB, Palpitations. No further BM.   Vital Signs    Vitals:   10/23/23 0630 10/23/23 0645 10/23/23 0700 10/23/23 0800  BP: 120/69 130/61 119/70 (!) 116/55  Pulse:      Resp: 14 15 18 18   Temp:    97.6 F (36.4 C)  TempSrc:    Oral  SpO2:    96%  Weight:      Height:        Intake/Output Summary (Last 24 hours) at 10/23/2023 0949 Last data filed at 10/23/2023 0658 Gross per 24 hour  Intake 1874.33 ml  Output 300 ml  Net 1574.33 ml   Filed Weights   10/22/23 1234 10/23/23 0444  Weight: (!) 190.1 kg (!) 186 kg    Physical Exam   GEN: No acute distress.   Neck: Thick neck Cardiac: Regular tachycardia, Distant heart sounds.  Respiratory: Clear to auscultation anteriorly. GI: Soft, nontender, non-distended    Labs   Telemetry: AFL RVR   Chemistry Recent Labs  Lab 10/22/23 1307 10/22/23 1351 10/23/23 0321  NA 135 137 136  K 4.3 4.5 4.0  CL 106 107 108  CO2 20*  --  21*  GLUCOSE 174* 153* 131*  BUN 22 27* 17  CREATININE 0.88 0.80 0.76  CALCIUM 8.1*  --  8.0*  PROT 5.8*  --   --   ALBUMIN 3.0*  --   --   AST 25  --   --   ALT 16  --   --   ALKPHOS 60  --   --   BILITOT 0.6  --   --   GFRNONAA >60  --  >60  ANIONGAP 9  --  7     Hematology Recent Labs  Lab 10/22/23 1307 10/22/23 1351 10/22/23 2022 10/23/23 0321  WBC 17.2*  --   --  15.7*  RBC 3.09*  --   --  3.07*  HGB 8.8* 8.5* 8.9* 8.8*  HCT 28.7* 25.0* 27.4* 27.3*  MCV 92.9  --   --  88.9  MCH 28.5  --   --  28.7  MCHC 30.7  --   --  32.2  RDW 14.7  --   --  14.6  PLT 419*  --   --  391    Cardiac Studies   Cardiac Studies & Procedures   ______________________________________________________________________________________________     ECHOCARDIOGRAM  ECHOCARDIOGRAM COMPLETE  08/07/2023  Narrative ECHOCARDIOGRAM REPORT    Patient Name:   Cody Hampton Date of Exam: 08/07/2023 Medical Rec #:  [id number]   Height:       70.0 in Accession #:    [id number]  Weight:       410.6 lb Date of Birth:  1960/11/12   BSA:          2.834 m Patient Age:    62 years    BP:           151/77 mmHg Patient Gender: M           HR:           67 bpm. Exam Location:  Inpatient  Procedure: 2D Echo, Cardiac Doppler and Color Doppler  Indications:    Atrial Fibrillation  History:        Patient has no prior history of  Echocardiogram examinations. Risk Factors:Hypertension and Sleep Apnea.  Sonographer:    Reta Cassis Referring Phys: [phone] Nathanel Bal   Sonographer Comments: Technically difficult study due to poor echo windows, patient is obese, suboptimal parasternal window, suboptimal apical window and suboptimal subcostal window. IMPRESSIONS   1. Left ventricular ejection fraction, by estimation, is 60 to 65%. The left ventricle has normal function. The left ventricle has no regional wall motion abnormalities. There is mild asymmetric left ventricular hypertrophy of the septal segment. Left ventricular diastolic parameters were normal. 2. Right ventricular systolic function is normal. The right ventricular size is severely enlarged. There is normal pulmonary artery systolic pressure. 3. Right atrial size was mildly dilated. 4. The mitral valve is normal in structure. No evidence of mitral valve regurgitation. No evidence of mitral stenosis. 5. The aortic valve is tricuspid. Aortic valve regurgitation is not visualized. No aortic stenosis is present.  Comparison(s): No prior Echocardiogram.  FINDINGS Left Ventricle: Left ventricular ejection fraction, by estimation, is 60 to 65%. The left ventricle has normal function. The left ventricle has no regional wall motion abnormalities. The left ventricular internal cavity size was normal in size. There is mild asymmetric left  ventricular hypertrophy of the septal segment. Left ventricular diastolic parameters were normal.  Right Ventricle: The right ventricular size is severely enlarged. No increase in right ventricular wall thickness. Right ventricular systolic function is normal. There is normal pulmonary artery systolic pressure. The tricuspid regurgitant velocity is 2.75 m/s, and with an assumed right atrial pressure of 3 mmHg, the estimated right ventricular systolic pressure is 33.2 mmHg.  Left Atrium: Left atrial size was normal in size.  Right Atrium: Right atrial size was mildly dilated.  Pericardium: There is no evidence of pericardial effusion.  Mitral Valve: The mitral valve is normal in structure. No evidence of mitral valve regurgitation. No evidence of mitral valve stenosis.  Tricuspid Valve: The tricuspid valve is normal in structure. Tricuspid valve regurgitation is not demonstrated. No evidence of tricuspid stenosis.  Aortic Valve: The aortic valve is tricuspid. Aortic valve regurgitation is not visualized. No aortic stenosis is present. Aortic valve mean gradient measures 5.0 mmHg. Aortic valve peak gradient measures 9.5 mmHg. Aortic valve area, by VTI measures 2.41 cm.  Pulmonic Valve: The pulmonic valve was normal in structure. Pulmonic valve regurgitation is not visualized. No evidence of pulmonic stenosis.  Aorta: The aortic root is normal in size and structure.  IAS/Shunts: The interatrial septum was not well visualized.   LEFT VENTRICLE PLAX 2D LVIDd:         5.30 cm      Diastology LVIDs:         3.30 cm      LV e' medial:    9.03 cm/s LV PW:         1.10 cm      LV E/e' medial:  12.4 LV IVS:        1.30 cm      LV e' lateral:   14.00 cm/s LVOT diam:     1.90 cm      LV E/e' lateral: 8.0 LV SV:         73 LV SV Index:   26 LVOT Area:     2.84 cm  LV Volumes (MOD) LV vol d, MOD A4C: 145.0 ml LV vol s, MOD A4C: 71.1 ml LV SV MOD A4C:     145.0 ml  RIGHT VENTRICLE RV  Basal diam:  5.50 cm RV S  prime:     18.00 cm/s TAPSE (M-mode): 2.9 cm  LEFT ATRIUM             Index        RIGHT ATRIUM           Index LA diam:        3.80 cm 1.34 cm/m   RA Area:     26.00 cm LA Vol (A2C):   50.8 ml 17.93 ml/m  RA Volume:   99.50 ml  35.11 ml/m LA Vol (A4C):   92.8 ml 32.75 ml/m LA Biplane Vol: 74.1 ml 26.15 ml/m AORTIC VALVE AV Area (Vmax):    2.28 cm AV Area (Vmean):   2.16 cm AV Area (VTI):     2.41 cm AV Vmax:           154.00 cm/s AV Vmean:          100.000 cm/s AV VTI:            0.301 m AV Peak Grad:      9.5 mmHg AV Mean Grad:      5.0 mmHg LVOT Vmax:         124.00 cm/s LVOT Vmean:        76.200 cm/s LVOT VTI:          0.256 m LVOT/AV VTI ratio: 0.85  AORTA Ao Root diam: 3.10 cm  MITRAL VALVE                TRICUSPID VALVE MV Area (PHT): 3.23 cm     TR Peak grad:   30.2 mmHg MV Decel Time: 235 msec     TR Vmax:        275.00 cm/s MV E velocity: 112.00 cm/s MV A velocity: 91.70 cm/s   SHUNTS MV E/A ratio:  1.22         Systemic VTI:  0.26 m Systemic Diam: 1.90 cm  Riley Lam MD Electronically signed by Riley Lam MD Signature Date/Time: 08/07/2023/12:07:41 PM    Final    MONITORS  LONG TERM MONITOR (3-14 DAYS) 07/27/2023  Narrative Patch Wear Time:  13 days and 23 hours  HR 49 - 211, average 71 bpm. No atrial fibrillation detected. Occasional supraventricular ectopy, 3.5%. Rare ventricular ectopy. No sustained arrhythmias. No symptom trigger episodes. 41 SVT episodes, all less than 12 seconds     [facility] Cardiac Electrophysiology       ______________________________________________________________________________________________       Assessment & Plan   Atrial flutter with RVR - unable to tolerated AC - 09/20/23 had rate controlled AFL; BB is intolerance to bisoprolol and metoprolol - currently on diltiazem infusion, post procedurally we will attempt to wean - avoiding  amiodarone if possible due to stroke risk - digoxin was started today  - preserved EF on echo    GI bleed/acute blood loss anemia - pending GI work up, no AC until cleared   Hypertension (history) Hypotension - tolerating CCB; unable to further titrate meds   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      Riley Lam, MD FASE [facility] Cardiologist [facility]  [address] [place], [address] [place], [place] [address] [phone]  9:49 AM

## 2023-10-23 NOTE — Progress Notes (Signed)
 [facility] PROGRESS NOTE   Cody Hampton UXL:[id number] DOB: 25-May-1961 DOA: 10/22/2023  PCP: Mattie Marlin, DO  Brief History: 63 y.o. male with medical history significant of atrial flutter on eliquis, HTN, HLD, GERD, MGUS, OSA, prediabetes, depression and anxiety, class 3 obesity presenting to the ED with black stools and weakness.  Found to have low hemoglobin.  Recently admitted in March 2025 for GI bleed and underwent EGD which did not show any obvious source of bleeding.  Patient hospitalized again.    Consultants:  Cardiology and gastroenterology   Procedures: None yet    Subjective/Interval History: Feels well.  Denies any abdominal pain nausea or vomiting.  No chest pain or shortness of breath.    Assessment/Plan:  Atrial flutter with RVR Prior to admission patient was on diltiazem for rate control.  Was on apixaban for anticoagulation. Currently on diltiazem infusion. Apixaban on hold due to concern for GI bleed. Cardiology is following. Most recent echocardiogram from January 2025 showed normal LVEF with enlarged RV but normal RV function.  GI bleed/acute blood loss anemia Hemoglobin was 13.6 earlier this month.  Presented with hemoglobin of 8.8.  Has been having dark-colored stools. Anticoagulation placed on hold. Gastroenterology consulted. Patient was transfused 1 unit of PRBC yesterday. Continue to monitor hemoglobin. Plan is for repeat endoscopy and push enteroscopy during this admission.  Waiting on Eliquis to washout. Remains on PPI.  Hypotension Seems to have improved.  Continue to monitor closely.  Hypocalcemia Received calcium gluconate in the ED.  Monitor daily for now.  Leukocytosis No obvious source of infection identified.  Could be reactive.  Improved this morning.  Recheck tomorrow.  History of anxiety and depression Continue Celexa.  Class III obesity Estimated body mass index is 58.84 kg/m as calculated from the  following:   Height as of this encounter: 5\' 10"  (1.778 m).   Weight as of this encounter: 186 kg.  DVT Prophylaxis: On SCDs Code Status: Full code Family Communication: Discussed with patient Disposition Plan: Hopefully return home when improved  Status is: Inpatient Remains inpatient appropriate because: Atrial flutter with RVR, GI bleed, acute blood loss anemia      Medications: Scheduled:  sodium chloride   Intravenous Once   citalopram  20 mg Oral BID   pantoprazole (PROTONIX) IV  40 mg Intravenous Q12H   Continuous:  diltiazem (CARDIZEM) infusion 12.5 mg/hr (10/23/23 0723)   ZDG:[id number] **OR** acetaminophen, diclofenac Sodium, ondansetron **OR** ondansetron (ZOFRAN) IV  Antibiotics: Anti-infectives (From admission, onward)    None       Objective:  Vital Signs  Vitals:   10/23/23 0630 10/23/23 0645 10/23/23 0700 10/23/23 0800  BP: 120/69 130/61 119/70 (!) 116/55  Pulse:      Resp: 14 15 18 18   Temp:    97.6 F (36.4 C)  TempSrc:    Oral  SpO2:    96%  Weight:      Height:        Intake/Output Summary (Last 24 hours) at 10/23/2023 0843 Last data filed at 10/23/2023 0658 Gross per 24 hour  Intake 1874.33 ml  Output 300 ml  Net 1574.33 ml   Filed Weights   10/22/23 1234 10/23/23 0444  Weight: (!) 190.1 kg (!) 186 kg    General appearance: Awake alert.  In no distress Resp: Clear to auscultation bilaterally.  Normal effort Cardio: S1-S2 is tachycardic GI: Abdomen is soft.  Nontender nondistended.  Bowel sounds are present normal.  No masses organomegaly  Extremities: Mild edema bilateral lower extremities.  Physical deconditioning is noted. Neurologic: Alert and oriented x3.  No focal neurological deficits.    Lab Results:  Data Reviewed: I have personally reviewed following labs and reports of the imaging studies  CBC: Recent Labs  Lab 10/22/23 1307 10/22/23 1351 10/22/23 2022 10/23/23 0321  WBC 17.2*  --   --  15.7*  NEUTROABS  13.7*  --   --   --   HGB 8.8* 8.5* 8.9* 8.8*  HCT 28.7* 25.0* 27.4* 27.3*  MCV 92.9  --   --  88.9  PLT 419*  --   --  391    Basic Metabolic Panel: Recent Labs  Lab 10/22/23 1307 10/22/23 1351 10/23/23 0321  NA 135 137 136  K 4.3 4.5 4.0  CL 106 107 108  CO2 20*  --  21*  GLUCOSE 174* 153* 131*  BUN 22 27* 17  CREATININE 0.88 0.80 0.76  CALCIUM 8.1*  --  8.0*  MG 1.8  --   --     GFR: Estimated Creatinine Clearance: 156 mL/min (by C-G formula based on SCr of 0.76 mg/dL).  Liver Function Tests: Recent Labs  Lab 10/22/23 1307  AST 25  ALT 16  ALKPHOS 60  BILITOT 0.6  PROT 5.8*  ALBUMIN 3.0*     Coagulation Profile: Recent Labs  Lab 10/22/23 1307  INR 1.3*    CBG: Recent Labs  Lab 10/23/23 0759  GLUCAP 133*     Radiology Studies: No results found.     LOS: 1 day   Cody Hampton  [facility] Pager on [email or web address]  10/23/2023, 8:43 AM

## 2023-10-23 NOTE — Plan of Care (Signed)
  Problem: Education: Goal: Knowledge of disease or condition will improve Outcome: Progressing Goal: Understanding of medication regimen will improve Outcome: Progressing   Problem: Education: Goal: Knowledge of General Education information will improve Description: Including pain rating scale, medication(s)/side effects and non-pharmacologic comfort measures Outcome: Progressing   Problem: Activity: Goal: Ability to tolerate increased activity will improve Outcome: Not Progressing   Problem: Cardiac: Goal: Ability to achieve and maintain adequate cardiopulmonary perfusion will improve Outcome: Not Progressing   Problem: Health Behavior/Discharge Planning: Goal: Ability to safely manage health-related needs after discharge will improve Outcome: Not Progressing   Problem: Health Behavior/Discharge Planning: Goal: Ability to manage health-related needs will improve Outcome: Not Progressing

## 2023-10-24 ENCOUNTER — Encounter (HOSPITAL_COMMUNITY): Payer: Self-pay | Admitting: Gastroenterology

## 2023-10-24 ENCOUNTER — Encounter (HOSPITAL_COMMUNITY)
Admission: EM | Disposition: A | Discharge status: Home / Self Care | Payer: Self-pay | Source: Home / Self Care | Attending: Internal Medicine

## 2023-10-24 DIAGNOSIS — I4892 Unspecified atrial flutter: Secondary | ICD-10-CM | POA: Diagnosis not present

## 2023-10-24 DIAGNOSIS — I4891 Unspecified atrial fibrillation: Secondary | ICD-10-CM | POA: Diagnosis not present

## 2023-10-24 HISTORY — PX: GIVENS CAPSULE STUDY: SHX5432

## 2023-10-24 LAB — BASIC METABOLIC PANEL WITH GFR
Anion gap: 8 (ref 5–15)
BUN: 10 mg/dL (ref 8–23)
CO2: 22 mmol/L (ref 22–32)
Calcium: 8.2 mg/dL — ABNORMAL LOW (ref 8.9–10.3)
Chloride: 105 mmol/L (ref 98–111)
Creatinine, Ser: 0.74 mg/dL (ref 0.61–1.24)
GFR, Estimated: >60 mL/min (ref >60)
Glucose, Bld: 144 mg/dL — ABNORMAL HIGH (ref 70–99)
Potassium: 3.9 mmol/L (ref 3.5–5.1)
Sodium: 135 mmol/L (ref 135–145)

## 2023-10-24 LAB — CBC
HCT: 27.1 % — ABNORMAL LOW (ref 39.0–52.0)
Hemoglobin: 8.9 g/dL — ABNORMAL LOW (ref 13.0–17.0)
MCH: 29.5 pg (ref 26.0–34.0)
MCHC: 32.8 g/dL (ref 30.0–36.0)
MCV: 89.7 fL (ref 80.0–100.0)
Platelets: 383 10*3/uL (ref 150–400)
RBC: 3.02 MIL/uL — ABNORMAL LOW (ref 4.22–5.81)
RDW: 14.9 % (ref 11.5–15.5)
WBC: 16.7 10*3/uL — ABNORMAL HIGH (ref 4.0–10.5)
nRBC: 0 % (ref 0.0–0.2)

## 2023-10-24 LAB — MAGNESIUM: Magnesium: 2 mg/dL (ref 1.7–2.4)

## 2023-10-24 SURGERY — IMAGING PROCEDURE, GI TRACT, INTRALUMINAL, VIA CAPSULE
Anesthesia: LOCAL

## 2023-10-24 MED ORDER — DILTIAZEM HCL 60 MG PO TABS
90.0000 mg | ORAL_TABLET | Freq: Four times a day (QID) | ORAL | Status: AC
  Administered 2023-10-24 – 2023-10-25 (×5): 90 mg via ORAL
  Filled 2023-10-24 (×6): qty 1

## 2023-10-24 SURGICAL SUPPLY — 1 items: TOWEL COTTON PACK 4EA (MISCELLANEOUS) ×2 IMPLANT

## 2023-10-24 NOTE — Progress Notes (Addendum)
 Progress Note  Patient Name: Cody Hampton Date of Encounter: 10/24/2023 Primary Cardiologist: Parke Poisson, MD   Subjective   This AM has started capsule endoscopy BP has improved with blood transfusion.  Vital Signs    Vitals:   10/23/23 2003 10/24/23 0000 10/24/23 0113 10/24/23 0357  BP: (!) 120/90  (!) 141/48 113/62  Pulse: (!) 116  (!) 114 (!) 110  Resp: 19  18 19   Temp: 98.4 F (36.9 C) 98.3 F (36.8 C) 98.1 F (36.7 C) 98.4 F (36.9 C)  TempSrc: Oral Oral Oral Oral  SpO2: 98% 98% 98% 98%  Weight:    (!) 188.3 kg  Height:        Intake/Output Summary (Last 24 hours) at 10/24/2023 0959 Last data filed at 10/24/2023 1610 Gross per 24 hour  Intake --  Output 300 ml  Net -300 ml   Filed Weights   10/22/23 1234 10/23/23 0444 10/24/23 0357  Weight: (!) 190.1 kg (!) 186 kg (!) 188.3 kg    Physical Exam   GEN: No acute distress.   Neck: Thick neck Cardiac: Regular tachycardia, Distant heart sounds.  Respiratory: Clear to auscultation anteriorly. GI: Soft, nontender, non-distended   Labs   Telemetry: AFL RVR   Chemistry Recent Labs  Lab 10/22/23 1307 10/22/23 1351 10/23/23 0321 10/24/23 0258  NA 135 137 136 135  K 4.3 4.5 4.0 3.9  CL 106 107 108 105  CO2 20*  --  21* 22  GLUCOSE 174* 153* 131* 144*  BUN 22 27* 17 10  CREATININE 0.88 0.80 0.76 0.74  CALCIUM 8.1*  --  8.0* 8.2*  PROT 5.8*  --   --   --   ALBUMIN 3.0*  --   --   --   AST 25  --   --   --   ALT 16  --   --   --   ALKPHOS 60  --   --   --   BILITOT 0.6  --   --   --   GFRNONAA >60  --  >60 >60  ANIONGAP 9  --  7 8     Hematology Recent Labs  Lab 10/23/23 0321 10/23/23 1521 10/24/23 0258  WBC 15.7* 14.5* 16.7*  RBC 3.07* 3.11* 3.02*  HGB 8.8* 8.8* 8.9*  HCT 27.3* 27.8* 27.1*  MCV 88.9 89.4 89.7  MCH 28.7 28.3 29.5  MCHC 32.2 31.7 32.8  RDW 14.6 14.9 14.9  PLT 391 415* 383    Cardiac Studies   Cardiac Studies & Procedures    ______________________________________________________________________________________________     ECHOCARDIOGRAM  ECHOCARDIOGRAM COMPLETE 08/07/2023  Narrative ECHOCARDIOGRAM REPORT    Patient Name:   CHER EGNOR Date of Exam: 08/07/2023 Medical Rec #:  [id number]   Height:       70.0 in Accession #:    [id number]  Weight:       410.6 lb Date of Birth:  [birth year]   BSA:          2.834 m Patient Age:    63 years    BP:           151/77 mmHg Patient Gender: M           HR:           67 bpm. Exam Location:  Inpatient  Procedure: 2D Echo, Cardiac Doppler and Color Doppler  Indications:    Atrial Fibrillation  History:  Patient has no prior history of Echocardiogram examinations. Risk Factors:Hypertension and Sleep Apnea.  Sonographer:    Reta Cassis Referring Phys: [phone] Nathanel Bal   Sonographer Comments: Technically difficult study due to poor echo windows, patient is obese, suboptimal parasternal window, suboptimal apical window and suboptimal subcostal window. IMPRESSIONS   1. Left ventricular ejection fraction, by estimation, is 60 to 65%. The left ventricle has normal function. The left ventricle has no regional wall motion abnormalities. There is mild asymmetric left ventricular hypertrophy of the septal segment. Left ventricular diastolic parameters were normal. 2. Right ventricular systolic function is normal. The right ventricular size is severely enlarged. There is normal pulmonary artery systolic pressure. 3. Right atrial size was mildly dilated. 4. The mitral valve is normal in structure. No evidence of mitral valve regurgitation. No evidence of mitral stenosis. 5. The aortic valve is tricuspid. Aortic valve regurgitation is not visualized. No aortic stenosis is present.  Comparison(s): No prior Echocardiogram.  FINDINGS Left Ventricle: Left ventricular ejection fraction, by estimation, is 60 to 65%. The left ventricle has normal function. The left  ventricle has no regional wall motion abnormalities. The left ventricular internal cavity size was normal in size. There is mild asymmetric left ventricular hypertrophy of the septal segment. Left ventricular diastolic parameters were normal.  Right Ventricle: The right ventricular size is severely enlarged. No increase in right ventricular wall thickness. Right ventricular systolic function is normal. There is normal pulmonary artery systolic pressure. The tricuspid regurgitant velocity is 2.75 m/s, and with an assumed right atrial pressure of 3 mmHg, the estimated right ventricular systolic pressure is 33.2 mmHg.  Left Atrium: Left atrial size was normal in size.  Right Atrium: Right atrial size was mildly dilated.  Pericardium: There is no evidence of pericardial effusion.  Mitral Valve: The mitral valve is normal in structure. No evidence of mitral valve regurgitation. No evidence of mitral valve stenosis.  Tricuspid Valve: The tricuspid valve is normal in structure. Tricuspid valve regurgitation is not demonstrated. No evidence of tricuspid stenosis.  Aortic Valve: The aortic valve is tricuspid. Aortic valve regurgitation is not visualized. No aortic stenosis is present. Aortic valve mean gradient measures 5.0 mmHg. Aortic valve peak gradient measures 9.5 mmHg. Aortic valve area, by VTI measures 2.41 cm.  Pulmonic Valve: The pulmonic valve was normal in structure. Pulmonic valve regurgitation is not visualized. No evidence of pulmonic stenosis.  Aorta: The aortic root is normal in size and structure.  IAS/Shunts: The interatrial septum was not well visualized.   LEFT VENTRICLE PLAX 2D LVIDd:         5.30 cm      Diastology LVIDs:         3.30 cm      LV e' medial:    9.03 cm/s LV PW:         1.10 cm      LV E/e' medial:  12.4 LV IVS:        1.30 cm      LV e' lateral:   14.00 cm/s LVOT diam:     1.90 cm      LV E/e' lateral: 8.0 LV SV:         73 LV SV Index:   26 LVOT Area:      2.84 cm  LV Volumes (MOD) LV vol d, MOD A4C: 145.0 ml LV vol s, MOD A4C: 71.1 ml LV SV MOD A4C:     145.0 ml  RIGHT VENTRICLE RV Basal  diam:  5.50 cm RV S prime:     18.00 cm/s TAPSE (M-mode): 2.9 cm  LEFT ATRIUM             Index        RIGHT ATRIUM           Index LA diam:        3.80 cm 1.34 cm/m   RA Area:     26.00 cm LA Vol (A2C):   50.8 ml 17.93 ml/m  RA Volume:   99.50 ml  35.11 ml/m LA Vol (A4C):   92.8 ml 32.75 ml/m LA Biplane Vol: 74.1 ml 26.15 ml/m AORTIC VALVE AV Area (Vmax):    2.28 cm AV Area (Vmean):   2.16 cm AV Area (VTI):     2.41 cm AV Vmax:           154.00 cm/s AV Vmean:          100.000 cm/s AV VTI:            0.301 m AV Peak Grad:      9.5 mmHg AV Mean Grad:      5.0 mmHg LVOT Vmax:         124.00 cm/s LVOT Vmean:        76.200 cm/s LVOT VTI:          0.256 m LVOT/AV VTI ratio: 0.85  AORTA Ao Root diam: 3.10 cm  MITRAL VALVE                TRICUSPID VALVE MV Area (PHT): 3.23 cm     TR Peak grad:   30.2 mmHg MV Decel Time: 235 msec     TR Vmax:        275.00 cm/s MV E velocity: 112.00 cm/s MV A velocity: 91.70 cm/s   SHUNTS MV E/A ratio:  1.22         Systemic VTI:  0.26 m Systemic Diam: 1.90 cm  Riley Lam MD Electronically signed by Riley Lam MD Signature Date/Time: 08/07/2023/12:07:41 PM    Final    MONITORS  LONG TERM MONITOR (3-14 DAYS) 07/27/2023  Narrative Patch Wear Time:  13 days and 23 hours  HR 49 - 211, average 71 bpm. No atrial fibrillation detected. Occasional supraventricular ectopy, 3.5%. Rare ventricular ectopy. No sustained arrhythmias. No symptom trigger episodes. 41 SVT episodes, all less than 12 seconds     [facility] Cardiac Electrophysiology       ______________________________________________________________________________________________       Assessment & Plan   Atrial flutter with RVR - unable to tolerated AC - 09/20/23 had rate controlled AFL;  BB is intolerance to bisoprolol and metoprolol - s/p capsul endoscopy; will transition to PO diltiazem today - digoxin was started 10/25/23 - preserved EF on echo    GI bleed/acute blood loss anemia - pending GI work up, no AC until cleared   Hypertension (history) Hypotension (resolving) - tolerating CCB; BP ADDENDUM: He clarifies that he cannot take long acting medications after gastric bypass; we will aim to switch to BID diltiazem XL after optimizing the dosing.   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      Riley Lam, MD FASE [facility] Cardiologist [facility]  [facility]  [address] [place], [address] [place], [place] [address] [phone]  9:59 AM

## 2023-10-24 NOTE — Plan of Care (Signed)
  Problem: Education: Goal: Knowledge of disease or condition will improve Outcome: Progressing Goal: Understanding of medication regimen will improve Outcome: Progressing   Problem: Education: Goal: Knowledge of General Education information will improve Description: Including pain rating scale, medication(s)/side effects and non-pharmacologic comfort measures Outcome: Progressing   Problem: Nutrition: Goal: Adequate nutrition will be maintained Outcome: Progressing   Problem: Elimination: Goal: Will not experience complications related to bowel motility Outcome: Progressing Goal: Will not experience complications related to urinary retention Outcome: Progressing   Problem: Activity: Goal: Ability to tolerate increased activity will improve Outcome: Not Progressing   Problem: Health Behavior/Discharge Planning: Goal: Ability to safely manage health-related needs after discharge will improve Outcome: Not Progressing   Problem: Health Behavior/Discharge Planning: Goal: Ability to manage health-related needs will improve Outcome: Not Progressing   Problem: Pain Managment: Goal: General experience of comfort will improve and/or be controlled Outcome: Not Progressing

## 2023-10-24 NOTE — Plan of Care (Signed)
  Problem: Education: Goal: Knowledge of disease or condition will improve Outcome: Progressing Goal: Understanding of medication regimen will improve 10/24/2023 1955 by Essie Hefty, RN Outcome: Progressing 10/24/2023 0613 by Essie Hefty, RN Outcome: Progressing Goal: Individualized Educational Video(s) Outcome: Not Applicable   Problem: Activity: Goal: Ability to tolerate increased activity will improve 10/24/2023 1955 by Essie Hefty, RN Outcome: Progressing 10/24/2023 0613 by Essie Hefty, RN Outcome: Progressing   Problem: Cardiac: Goal: Ability to achieve and maintain adequate cardiopulmonary perfusion will improve Outcome: Progressing   Problem: Education: Goal: Knowledge of General Education information will improve Description: Including pain rating scale, medication(s)/side effects and non-pharmacologic comfort measures Outcome: Progressing   Problem: Health Behavior/Discharge Planning: Goal: Ability to manage health-related needs will improve Outcome: Progressing

## 2023-10-24 NOTE — Progress Notes (Signed)
 PROGRESS NOTE    Cody Hampton  QIO:[id number] DOB: 12/06/1960 DOA: 10/22/2023 PCP: Mattie Marlin, DO     Brief Narrative:  Cody Hampton is a 63 y.o. male with medical history significant of atrial flutter on eliquis, HTN, HLD, GERD, MGUS, OSA, prediabetes, depression and anxiety, class 3 obesity presenting to the ED with black stools and weakness.  Found to have low hemoglobin.  Recently admitted in March 2025 for GI bleed and underwent EGD which did not show any obvious source of bleeding.  Patient hospitalized again for GI work up.   New events last 24 hours / Subjective: Feeling great.  Undergoing video capsule endoscopy.  Reports black stool around midnight.  Assessment & Plan:   Principal Problem:   Atrial flutter with rapid ventricular response (HCC) Active Problems:   Anxiety and depression   Class 3 severe obesity due to excess calories with serious comorbidity and body mass index (BMI) greater than or equal to 70 in adult [facility])   Essential (primary) hypertension   Gastroesophageal reflux disease   OSA (obstructive sleep apnea)   S/P bariatric surgery   GI bleeding   Chronic anticoagulation   Atrial flutter with RVR Prior to admission patient was on diltiazem for rate control.  Was on apixaban for anticoagulation. Currently on diltiazem infusion --> PO today, digoxin.  Patient unable to tolerate bisoprolol, metoprolol Apixaban on hold due to concern for GI bleed. Cardiology is following. Most recent echocardiogram from January 2025 showed normal LVEF with enlarged RV but normal RV function.   GI bleed/acute blood loss anemia Hemoglobin was 13.6 earlier this month.  Presented with hemoglobin of 8.8.  Has been having dark-colored stools. Anticoagulation placed on hold. Gastroenterology consulted. Patient was transfused 1 unit of PRBC 10/22/23. Remains on PPI VCE in process    Hypotension Resolved    Hypocalcemia Received calcium gluconate in the ED    Leukocytosis No obvious source of infection identified.  Could be reactive. Monitor    History of anxiety and depression Continue Celexa.   Class III obesity Estimated body mass index is 59.56 kg/m as calculated from the following:   Height as of this encounter: 5\' 10"  (1.778 m).   Weight as of this encounter: 188.3 kg.   DVT prophylaxis:  SCDs Start: 10/22/23 1605  Code Status: Full Family Communication: None at bedside Disposition Plan:  Pending work up  Status is: Inpatient Remains inpatient appropriate because: remains on cardizem gtt. VCE today.     Antimicrobials:  Anti-infectives (From admission, onward)    None        Objective: Vitals:   10/23/23 2003 10/24/23 0000 10/24/23 0113 10/24/23 0357  BP: (!) 120/90  (!) 141/48 113/62  Pulse: (!) 116  (!) 114 (!) 110  Resp: 19  18 19   Temp: 98.4 F (36.9 C) 98.3 F (36.8 C) 98.1 F (36.7 C) 98.4 F (36.9 C)  TempSrc: Oral Oral Oral Oral  SpO2: 98% 98% 98% 98%  Weight:    (!) 188.3 kg  Height:        Intake/Output Summary (Last 24 hours) at 10/24/2023 1235 Last data filed at 10/24/2023 3244 Gross per 24 hour  Intake --  Output 300 ml  Net -300 ml   Filed Weights   10/22/23 1234 10/23/23 0444 10/24/23 0357  Weight: (!) 190.1 kg (!) 186 kg (!) 188.3 kg    Examination:  General exam: Appears calm and comfortable  Respiratory system: Clear to auscultation. Respiratory effort  normal. No respiratory distress. No conversational dyspnea.  Cardiovascular system: S1 & S2 heard, irreg rhythm, rate 100s  Gastrointestinal system: Abdomen is nondistended, soft and nontender. Normal bowel sounds heard. Central nervous system: Alert and oriented. No focal neurological deficits. Speech clear.  Extremities: Symmetric in appearance  Skin: No rashes, lesions or ulcers on exposed skin  Psychiatry: Judgement and insight appear normal. Mood & affect appropriate.   Data Reviewed: I have personally reviewed following  labs and imaging studies  CBC: Recent Labs  Lab 10/22/23 1307 10/22/23 1351 10/22/23 2022 10/23/23 0321 10/23/23 1521 10/24/23 0258  WBC 17.2*  --   --  15.7* 14.5* 16.7*  NEUTROABS 13.7*  --   --   --   --   --   HGB 8.8* 8.5* 8.9* 8.8* 8.8* 8.9*  HCT 28.7* 25.0* 27.4* 27.3* 27.8* 27.1*  MCV 92.9  --   --  88.9 89.4 89.7  PLT 419*  --   --  391 415* 383   Basic Metabolic Panel: Recent Labs  Lab 10/22/23 1307 10/22/23 1351 10/23/23 0321 10/24/23 0258  NA 135 137 136 135  K 4.3 4.5 4.0 3.9  CL 106 107 108 105  CO2 20*  --  21* 22  GLUCOSE 174* 153* 131* 144*  BUN 22 27* 17 10  CREATININE 0.88 0.80 0.76 0.74  CALCIUM 8.1*  --  8.0* 8.2*  MG 1.8  --   --  2.0   GFR: Estimated Creatinine Clearance: 157.1 mL/min (by C-G formula based on SCr of 0.74 mg/dL). Liver Function Tests: Recent Labs  Lab 10/22/23 1307  AST 25  ALT 16  ALKPHOS 60  BILITOT 0.6  PROT 5.8*  ALBUMIN 3.0*   No results for input(s): "LIPASE", "AMYLASE" in the last 168 hours. No results for input(s): "AMMONIA" in the last 168 hours. Coagulation Profile: Recent Labs  Lab 10/22/23 1307  INR 1.3*   Cardiac Enzymes: No results for input(s): "CKTOTAL", "CKMB", "CKMBINDEX", "TROPONINI" in the last 168 hours. BNP (last 3 results) No results for input(s): "PROBNP" in the last 8760 hours. HbA1C: No results for input(s): "HGBA1C" in the last 72 hours. CBG: Recent Labs  Lab 10/23/23 0759  GLUCAP 133*   Lipid Profile: No results for input(s): "CHOL", "HDL", "LDLCALC", "TRIG", "CHOLHDL", "LDLDIRECT" in the last 72 hours. Thyroid Function Tests: No results for input(s): "TSH", "T4TOTAL", "FREET4", "T3FREE", "THYROIDAB" in the last 72 hours. Anemia Panel: No results for input(s): "VITAMINB12", "FOLATE", "FERRITIN", "TIBC", "IRON", "RETICCTPCT" in the last 72 hours. Sepsis Labs: No results for input(s): "PROCALCITON", "LATICACIDVEN" in the last 168 hours.  No results found for this or any  previous visit (from the past 240 hours).    Radiology Studies: No results found.    Scheduled Meds:  sodium chloride   Intravenous Once   citalopram  20 mg Oral BID   digoxin  0.125 mg Oral Daily   diltiazem  90 mg Oral Q6H   pantoprazole  40 mg Oral Daily   Continuous Infusions:   LOS: 2 days   Time spent: 30 minutes   Noralee Stain, DO [facility] 10/24/2023, 12:35 PM   Available via Epic secure chat 7am-7pm After these hours, please refer to coverage provider listed on [email or web address]

## 2023-10-24 NOTE — Progress Notes (Signed)
 Cody Hampton capsule endoscopy ordered by MD Nickey Barn.  Patient ingested capsule at 0840.  Per Given's capsule instructions, patient to remain NPO until 1040 at which time they may progress to clear liquid diet. At 1240  patient may have a small snack such as a half a sandwich or a bowl of soup. At 1640 patient may progress to previously ordered diet.  The capsule endoscopy study will conclude at 2040 (8:40pm) at which time the recorder and leads or belt can be removed and placed in a patient belongings bag. Endoscopy staff will pick up the equipment in the AM.  Instructions provided to patient and inpatient RN. Patient and RN demonstrated understanding.

## 2023-10-24 NOTE — Plan of Care (Signed)
  Problem: Education: Goal: Knowledge of disease or condition will improve Outcome: Progressing Goal: Understanding of medication regimen will improve Outcome: Progressing Goal: Individualized Educational Video(s) Outcome: Not Applicable   Problem: Activity: Goal: Ability to tolerate increased activity will improve Outcome: Progressing   Problem: Education: Goal: Knowledge of General Education information will improve Description: Including pain rating scale, medication(s)/side effects and non-pharmacologic comfort measures Outcome: Progressing   Problem: Health Behavior/Discharge Planning: Goal: Ability to manage health-related needs will improve Outcome: Progressing

## 2023-10-25 DIAGNOSIS — I4892 Unspecified atrial flutter: Secondary | ICD-10-CM | POA: Diagnosis not present

## 2023-10-25 LAB — GLUCOSE, CAPILLARY: Glucose-Capillary: 200 mg/dL — ABNORMAL HIGH (ref 70–99)

## 2023-10-25 LAB — BASIC METABOLIC PANEL WITH GFR
Anion gap: 10 (ref 5–15)
BUN: 10 mg/dL (ref 8–23)
CO2: 22 mmol/L (ref 22–32)
Calcium: 8.2 mg/dL — ABNORMAL LOW (ref 8.9–10.3)
Chloride: 104 mmol/L (ref 98–111)
Creatinine, Ser: 0.81 mg/dL (ref 0.61–1.24)
GFR, Estimated: >60 mL/min (ref >60)
Glucose, Bld: 138 mg/dL — ABNORMAL HIGH (ref 70–99)
Potassium: 3.9 mmol/L (ref 3.5–5.1)
Sodium: 136 mmol/L (ref 135–145)

## 2023-10-25 LAB — CBC
HCT: 29.3 % — ABNORMAL LOW (ref 39.0–52.0)
Hemoglobin: 9.1 g/dL — ABNORMAL LOW (ref 13.0–17.0)
MCH: 28.2 pg (ref 26.0–34.0)
MCHC: 31.1 g/dL (ref 30.0–36.0)
MCV: 90.7 fL (ref 80.0–100.0)
Platelets: 456 10*3/uL — ABNORMAL HIGH (ref 150–400)
RBC: 3.23 MIL/uL — ABNORMAL LOW (ref 4.22–5.81)
RDW: 14.9 % (ref 11.5–15.5)
WBC: 17 10*3/uL — ABNORMAL HIGH (ref 4.0–10.5)
nRBC: 0 % (ref 0.0–0.2)

## 2023-10-25 MED ORDER — APIXABAN 5 MG PO TABS
5.0000 mg | ORAL_TABLET | Freq: Two times a day (BID) | ORAL | Status: DC
  Filled 2023-10-25: qty 1

## 2023-10-25 MED ORDER — DILTIAZEM HCL ER COATED BEADS 180 MG PO CP24
180.0000 mg | ORAL_CAPSULE | Freq: Two times a day (BID) | ORAL | Status: DC
  Administered 2023-10-25: 180 mg via ORAL
  Filled 2023-10-25: qty 1

## 2023-10-25 NOTE — Plan of Care (Signed)
  Problem: Education: Goal: Knowledge of disease or condition will improve Outcome: Progressing   Problem: Activity: Goal: Ability to tolerate increased activity will improve Outcome: Progressing   Problem: Cardiac: Goal: Ability to achieve and maintain adequate cardiopulmonary perfusion will improve Outcome: Progressing   Problem: Health Behavior/Discharge Planning: Goal: Ability to safely manage health-related needs after discharge will improve Outcome: Progressing   Problem: Education: Goal: Knowledge of General Education information will improve Description: Including pain rating scale, medication(s)/side effects and non-pharmacologic comfort measures Outcome: Progressing

## 2023-10-25 NOTE — Progress Notes (Addendum)
 PROGRESS NOTE    Cody Hampton  ZOX:[id number] DOB: 03-13-1961 DOA: 10/22/2023 PCP: Lazoff, Shawn P, DO     Brief Narrative:  Cody Hampton is a 63 y.o. male with medical history significant of atrial flutter on eliquis , HTN, HLD, GERD, MGUS, OSA, prediabetes, depression and anxiety, class 3 obesity presenting to the ED with black stools and weakness.  Found to have low hemoglobin.  Recently admitted in March 2025 for GI bleed and underwent EGD which did not show any obvious source of bleeding.  Patient hospitalized again for GI work up.   New events last 24 hours / Subjective: Has no complaints.  Heart rate on telemetry during examination ranges 80-140.  He has no complaints of palpitations or chest pain.  Had a small bowel movement earlier, was black  Assessment & Plan:   Principal Problem:   Atrial flutter with rapid ventricular response (HCC) Active Problems:   Anxiety and depression   Class 3 severe obesity due to excess calories with serious comorbidity and body mass index (BMI) greater than or equal to 70 in adult [facility])   Essential (primary) hypertension   Gastroesophageal reflux disease   OSA (obstructive sleep apnea)   S/P bariatric surgery   GI bleeding   Chronic anticoagulation   Atrial flutter with RVR Prior to admission patient was on diltiazem  for rate control.  Was on apixaban  for anticoagulation. P.o. Cardizem , digoxin .  Patient unable to tolerate bisoprolol , metoprolol  Apixaban  on hold due to concern for GI bleed. Cardiology is following. Most recent echocardiogram from January 2025 showed normal LVEF with enlarged RV but normal RV function.   GI bleed/acute blood loss anemia Hemoglobin was 13.6 earlier this month.  Presented with hemoglobin of 8.8.  Has been having dark-colored stools. Anticoagulation placed on hold. Gastroenterology consulted. Patient was transfused 1 unit of PRBC 10/22/23. Remains on PPI Per Dr. Nickey Barn: VCE without evidence of any  bleeding. 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 May resume Eliquis . If bleeding recurs, he needs a CTA. His primary GI is with [facility] and he will need outpatient follow up.    Hypotension Resolved    Hypocalcemia Received calcium  gluconate in the ED   Leukocytosis No obvious source of infection identified.  Could be reactive. Monitor    History of anxiety and depression Continue Celexa .   Class III obesity Estimated body mass index is 58.71 kg/m as calculated from the following:   Height as of this encounter: 5\' 10"  (1.778 m).   Weight as of this encounter: 185.6 kg.   DVT prophylaxis:  SCDs Start: 10/22/23 1605  Code Status: Full Family Communication: None at bedside Disposition Plan:  Pending work up  Status is: Inpatient Remains inpatient appropriate because: Awaiting VCE results   Antimicrobials:  Anti-infectives (From admission, onward)    None        Objective: Vitals:   10/25/23 0458 10/25/23 0749 10/25/23 0928 10/25/23 1155  BP: (!) 148/70 (!) 143/58  131/67  Pulse: (!) 105 (!) 56 97 (!) 103  Resp: 18 16  18   Temp: 98.2 F (36.8 C) 98.4 F (36.9 C)    TempSrc: Oral Oral    SpO2: 92% 99%  97%  Weight: (!) 185.6 kg     Height:        Intake/Output Summary (Last 24 hours) at 10/25/2023 1204 Last data filed at 10/25/2023 1155 Gross per 24 hour  Intake 177 ml  Output 600 ml  Net -423 ml   Filed Weights   10/23/23 0444 10/24/23 0357 10/25/23 0458  Weight: (!) 186 kg (!) 188.3 kg (!) 185.6 kg    Examination:  General exam: Appears calm and comfortable  Respiratory system: Clear to auscultation. Respiratory effort normal. No respiratory distress. No conversational dyspnea.  Cardiovascular system: S1 & S2 heard, irreg rhythm, rate 80-140s  Gastrointestinal system: Abdomen is nondistended, soft and nontender. Normal bowel sounds heard. Central nervous system: Alert and  oriented. No focal neurological deficits. Speech clear.  Extremities: Symmetric in appearance  Skin: No rashes, lesions or ulcers on exposed skin  Psychiatry: Judgement and insight appear normal. Mood & affect appropriate.   Data Reviewed: I have personally reviewed following labs and imaging studies  CBC: Recent Labs  Lab 10/22/23 1307 10/22/23 1351 10/22/23 2022 10/23/23 0321 10/23/23 1521 10/24/23 0258 10/25/23 0316  WBC 17.2*  --   --  15.7* 14.5* 16.7* 17.0*  NEUTROABS 13.7*  --   --   --   --   --   --   HGB 8.8*   < > 8.9* 8.8* 8.8* 8.9* 9.1*  HCT 28.7*   < > 27.4* 27.3* 27.8* 27.1* 29.3*  MCV 92.9  --   --  88.9 89.4 89.7 90.7  PLT 419*  --   --  391 415* 383 456*   < > = values in this interval not displayed.   Basic Metabolic Panel: Recent Labs  Lab 10/22/23 1307 10/22/23 1351 10/23/23 0321 10/24/23 0258 10/25/23 0316  NA 135 137 136 135 136  K 4.3 4.5 4.0 3.9 3.9  CL 106 107 108 105 104  CO2 20*  --  21* 22 22  GLUCOSE 174* 153* 131* 144* 138*  BUN 22 27* 17 10 10   CREATININE 0.88 0.80 0.76 0.74 0.81  CALCIUM  8.1*  --  8.0* 8.2* 8.2*  MG 1.8  --   --  2.0  --    GFR: Estimated Creatinine Clearance: 153.8 mL/min (by C-G formula based on SCr of 0.81 mg/dL). Liver Function Tests: Recent Labs  Lab 10/22/23 1307  AST 25  ALT 16  ALKPHOS 60  BILITOT 0.6  PROT 5.8*  ALBUMIN 3.0*   No results for input(s): "LIPASE", "AMYLASE" in the last 168 hours. No results for input(s): "AMMONIA" in the last 168 hours. Coagulation Profile: Recent Labs  Lab 10/22/23 1307  INR 1.3*   Cardiac Enzymes: No results for input(s): "CKTOTAL", "CKMB", "CKMBINDEX", "TROPONINI" in the last 168 hours. BNP (last 3 results) No results for input(s): "PROBNP" in the last 8760 hours. HbA1C: No results for input(s): "HGBA1C" in the last 72 hours. CBG: Recent Labs  Lab 10/23/23 0759 10/25/23 0924  GLUCAP 133* 200*   Lipid Profile: No results for input(s): "CHOL", "HDL",  "LDLCALC", "TRIG", "CHOLHDL", "LDLDIRECT" in the last 72 hours. Thyroid  Function Tests: No results for input(s): "TSH", "T4TOTAL", "FREET4", "T3FREE", "THYROIDAB" in the last 72 hours. Anemia Panel: No results for input(s): "VITAMINB12", "FOLATE", "FERRITIN", "TIBC", "IRON ", "RETICCTPCT" in the last 72 hours. Sepsis Labs: No results for input(s): "PROCALCITON", "LATICACIDVEN" in the last 168 hours.  No results found for this or any previous visit (from the past 240 hours).    Radiology Studies: No results found.    Scheduled Meds:  citalopram   20 mg Oral BID   digoxin   0.125 mg Oral Daily   diltiazem   90 mg Oral Q6H   pantoprazole   40 mg Oral Daily  Continuous Infusions:   LOS: 3 days   Time spent: 25 minutes   Daren Eck, DO [facility] 10/25/2023, 12:04 PM   Available via Epic secure chat 7am-7pm After these hours, please refer to coverage provider listed on [email or web address]

## 2023-10-25 NOTE — Care Management Important Message (Signed)
 Important Message  Patient Details  Name: Cody Hampton MRN: [id number] Date of Birth: 02/04/1961   Important Message Given:  Yes - Medicare IM     Janith Melnick 10/25/2023, 10:42 AM

## 2023-10-25 NOTE — Progress Notes (Signed)
 Per Dr. [facility], hold Eliquis pending results of GI study.

## 2023-10-25 NOTE — Progress Notes (Signed)
 Progress Note  Patient Name: Cody Hampton Date of Encounter: 10/25/2023 Primary Cardiologist: Parke Poisson, MD   Subjective   Overnight has had paroxysms of Sinus bradycardia.  Off IV meds  Vital Signs    Vitals:   10/25/23 0458 10/25/23 0749 10/25/23 0928 10/25/23 1155  BP: (!) 148/70 (!) 143/58  131/67  Pulse: (!) 105 (!) 56 97 (!) 103  Resp: 18 16  18   Temp: 98.2 F (36.8 C) 98.4 F (36.9 C)    TempSrc: Oral Oral    SpO2: 92% 99%  97%  Weight: (!) 185.6 kg     Height:        Intake/Output Summary (Last 24 hours) at 10/25/2023 1213 Last data filed at 10/25/2023 1155 Gross per 24 hour  Intake 177 ml  Output 600 ml  Net -423 ml   Filed Weights   10/23/23 0444 10/24/23 0357 10/25/23 0458  Weight: (!) 186 kg (!) 188.3 kg (!) 185.6 kg    Physical Exam   GEN: No acute distress.   Neck: Thick neck Cardiac: Regular tachycardia, Distant heart sounds.  Respiratory: Clear to auscultation anteriorly. GI: Soft, nontender, non-distended   Labs   Telemetry: AFL SR-> AFL   Chemistry Recent Labs  Lab 10/22/23 1307 10/22/23 1351 10/23/23 0321 10/24/23 0258 10/25/23 0316  NA 135   < > 136 135 136  K 4.3   < > 4.0 3.9 3.9  CL 106   < > 108 105 104  CO2 20*  --  21* 22 22  GLUCOSE 174*   < > 131* 144* 138*  BUN 22   < > 17 10 10   CREATININE 0.88   < > 0.76 0.74 0.81  CALCIUM 8.1*  --  8.0* 8.2* 8.2*  PROT 5.8*  --   --   --   --   ALBUMIN 3.0*  --   --   --   --   AST 25  --   --   --   --   ALT 16  --   --   --   --   ALKPHOS 60  --   --   --   --   BILITOT 0.6  --   --   --   --   GFRNONAA >60  --  >60 >60 >60  ANIONGAP 9  --  7 8 10    < > = values in this interval not displayed.     Hematology Recent Labs  Lab 10/23/23 1521 10/24/23 0258 10/25/23 0316  WBC 14.5* 16.7* 17.0*  RBC 3.11* 3.02* 3.23*  HGB 8.8* 8.9* 9.1*  HCT 27.8* 27.1* 29.3*  MCV 89.4 89.7 90.7  MCH 28.3 29.5 28.2  MCHC 31.7 32.8 31.1  RDW 14.9 14.9 14.9  PLT 415* 383  456*    Cardiac Studies   Cardiac Studies & Procedures   ______________________________________________________________________________________________     ECHOCARDIOGRAM  ECHOCARDIOGRAM COMPLETE 08/07/2023  Narrative ECHOCARDIOGRAM REPORT    Patient Name:   Cody Hampton Date of Exam: 08/07/2023 Medical Rec #:  [id number]   Height:       70.0 in Accession #:    [id number]  Weight:       410.6 lb Date of Birth:  [birth year]   BSA:          2.834 m Patient Age:    62 years    BP:           151/77 mmHg Patient  Gender: M           HR:           67 bpm. Exam Location:  Inpatient  Procedure: 2D Echo, Cardiac Doppler and Color Doppler  Indications:    Atrial Fibrillation  History:        Patient has no prior history of Echocardiogram examinations. Risk Factors:Hypertension and Sleep Apnea.  Sonographer:    Karma Ganja Referring Phys: [id number] Eustace Pen   Sonographer Comments: Technically difficult study due to poor echo windows, patient is obese, suboptimal parasternal window, suboptimal apical window and suboptimal subcostal window. IMPRESSIONS   1. Left ventricular ejection fraction, by estimation, is 60 to 65%. The left ventricle has normal function. The left ventricle has no regional wall motion abnormalities. There is mild asymmetric left ventricular hypertrophy of the septal segment. Left ventricular diastolic parameters were normal. 2. Right ventricular systolic function is normal. The right ventricular size is severely enlarged. There is normal pulmonary artery systolic pressure. 3. Right atrial size was mildly dilated. 4. The mitral valve is normal in structure. No evidence of mitral valve regurgitation. No evidence of mitral stenosis. 5. The aortic valve is tricuspid. Aortic valve regurgitation is not visualized. No aortic stenosis is present.  Comparison(s): No prior Echocardiogram.  FINDINGS Left Ventricle: Left ventricular ejection fraction, by estimation, is  60 to 65%. The left ventricle has normal function. The left ventricle has no regional wall motion abnormalities. The left ventricular internal cavity size was normal in size. There is mild asymmetric left ventricular hypertrophy of the septal segment. Left ventricular diastolic parameters were normal.  Right Ventricle: The right ventricular size is severely enlarged. No increase in right ventricular wall thickness. Right ventricular systolic function is normal. There is normal pulmonary artery systolic pressure. The tricuspid regurgitant velocity is 2.75 m/s, and with an assumed right atrial pressure of 3 mmHg, the estimated right ventricular systolic pressure is 33.2 mmHg.  Left Atrium: Left atrial size was normal in size.  Right Atrium: Right atrial size was mildly dilated.  Pericardium: There is no evidence of pericardial effusion.  Mitral Valve: The mitral valve is normal in structure. No evidence of mitral valve regurgitation. No evidence of mitral valve stenosis.  Tricuspid Valve: The tricuspid valve is normal in structure. Tricuspid valve regurgitation is not demonstrated. No evidence of tricuspid stenosis.  Aortic Valve: The aortic valve is tricuspid. Aortic valve regurgitation is not visualized. No aortic stenosis is present. Aortic valve mean gradient measures 5.0 mmHg. Aortic valve peak gradient measures 9.5 mmHg. Aortic valve area, by VTI measures 2.41 cm.  Pulmonic Valve: The pulmonic valve was normal in structure. Pulmonic valve regurgitation is not visualized. No evidence of pulmonic stenosis.  Aorta: The aortic root is normal in size and structure.  IAS/Shunts: The interatrial septum was not well visualized.   LEFT VENTRICLE PLAX 2D LVIDd:         5.30 cm      Diastology LVIDs:         3.30 cm      LV e' medial:    9.03 cm/s LV PW:         1.10 cm      LV E/e' medial:  12.4 LV IVS:        1.30 cm      LV e' lateral:   14.00 cm/s LVOT diam:     1.90 cm      LV E/e'  lateral: 8.0 LV SV:  73 LV SV Index:   26 LVOT Area:     2.84 cm  LV Volumes (MOD) LV vol d, MOD A4C: 145.0 ml LV vol s, MOD A4C: 71.1 ml LV SV MOD A4C:     145.0 ml  RIGHT VENTRICLE RV Basal diam:  5.50 cm RV S prime:     18.00 cm/s TAPSE (M-mode): 2.9 cm  LEFT ATRIUM             Index        RIGHT ATRIUM           Index LA diam:        3.80 cm 1.34 cm/m   RA Area:     26.00 cm LA Vol (A2C):   50.8 ml 17.93 ml/m  RA Volume:   99.50 ml  35.11 ml/m LA Vol (A4C):   92.8 ml 32.75 ml/m LA Biplane Vol: 74.1 ml 26.15 ml/m AORTIC VALVE AV Area (Vmax):    2.28 cm AV Area (Vmean):   2.16 cm AV Area (VTI):     2.41 cm AV Vmax:           154.00 cm/s AV Vmean:          100.000 cm/s AV VTI:            0.301 m AV Peak Grad:      9.5 mmHg AV Mean Grad:      5.0 mmHg LVOT Vmax:         124.00 cm/s LVOT Vmean:        76.200 cm/s LVOT VTI:          0.256 m LVOT/AV VTI ratio: 0.85  AORTA Ao Root diam: 3.10 cm  MITRAL VALVE                TRICUSPID VALVE MV Area (PHT): 3.23 cm     TR Peak grad:   30.2 mmHg MV Decel Time: 235 msec     TR Vmax:        275.00 cm/s MV E velocity: 112.00 cm/s MV A velocity: 91.70 cm/s   SHUNTS MV E/A ratio:  1.22         Systemic VTI:  0.26 m Systemic Diam: 1.90 cm  Riley Lam MD Electronically signed by Riley Lam MD Signature Date/Time: 08/07/2023/12:07:41 PM    Final    MONITORS  LONG TERM MONITOR (3-14 DAYS) 07/27/2023  Narrative Patch Wear Time:  13 days and 23 hours  HR 49 - 211, average 71 bpm. No atrial fibrillation detected. Occasional supraventricular ectopy, 3.5%. Rare ventricular ectopy. No sustained arrhythmias. No symptom trigger episodes. 41 SVT episodes, all less than 12 seconds     [facility] Cardiac Electrophysiology       ______________________________________________________________________________________________       Assessment & Plan   Atrial flutter with  RVR - unable to tolerated AC - 09/20/23 had rate controlled AFL; BB is intolerance to bisoprolol and metoprolol - s/p capsul endoscopy pending results to decide clearance for [facility] - digoxin was started 10/25/23 - transitioned to diltiazem 180 mg XL  PO BID   GI bleed/acute blood loss anemia - pending GI work up, no AC until cleared   Hypertension (history) Hypotension (resolving) - tolerating CCB; BP, improved with GI bleed intervention   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      Gloriann Larger, MD FASE [facility] Cardiologist [facility]  [address], [address] [place], [place] [address] [phone]  12:13 PM

## 2023-10-26 DIAGNOSIS — I4892 Unspecified atrial flutter: Secondary | ICD-10-CM | POA: Diagnosis not present

## 2023-10-26 LAB — BASIC METABOLIC PANEL WITH GFR
Anion gap: 11 (ref 5–15)
BUN: 12 mg/dL (ref 8–23)
CO2: 22 mmol/L (ref 22–32)
Calcium: 8.2 mg/dL — ABNORMAL LOW (ref 8.9–10.3)
Chloride: 105 mmol/L (ref 98–111)
Creatinine, Ser: 0.73 mg/dL (ref 0.61–1.24)
GFR, Estimated: >60 mL/min (ref >60)
Glucose, Bld: 117 mg/dL — ABNORMAL HIGH (ref 70–99)
Potassium: 3.8 mmol/L (ref 3.5–5.1)
Sodium: 138 mmol/L (ref 135–145)

## 2023-10-26 LAB — CBC
HCT: 30.9 % — ABNORMAL LOW (ref 39.0–52.0)
Hemoglobin: 9.6 g/dL — ABNORMAL LOW (ref 13.0–17.0)
MCH: 28.8 pg (ref 26.0–34.0)
MCHC: 31.1 g/dL (ref 30.0–36.0)
MCV: 92.8 fL (ref 80.0–100.0)
Platelets: 419 10*3/uL — ABNORMAL HIGH (ref 150–400)
RBC: 3.33 MIL/uL — ABNORMAL LOW (ref 4.22–5.81)
RDW: 15.1 % (ref 11.5–15.5)
WBC: 14.3 10*3/uL — ABNORMAL HIGH (ref 4.0–10.5)
nRBC: 0.1 % (ref 0.0–0.2)

## 2023-10-26 LAB — GLUCOSE, CAPILLARY: Glucose-Capillary: 110 mg/dL — ABNORMAL HIGH (ref 70–99)

## 2023-10-26 MED ORDER — DOCUSATE SODIUM 100 MG PO CAPS: 100.0000 mg | ORAL_CAPSULE | Freq: Two times a day (BID) | ORAL | Status: DC | PRN

## 2023-10-26 MED ORDER — SODIUM CHLORIDE 0.9% FLUSH
3.0000 mL | Freq: Two times a day (BID) | INTRAVENOUS | Status: DC
  Administered 2023-10-26 – 2023-10-27 (×3): 10 mL via INTRAVENOUS
  Administered 2023-10-27: 3 mL via INTRAVENOUS
  Administered 2023-10-28 (×2): 10 mL via INTRAVENOUS

## 2023-10-26 MED ORDER — SODIUM CHLORIDE 0.9% FLUSH
3.0000 mL | INTRAVENOUS | Status: DC | PRN
  Administered 2023-10-26: 10 mL via INTRAVENOUS

## 2023-10-26 MED ORDER — POLYETHYLENE GLYCOL 3350 17 G PO PACK
17.0000 g | PACK | Freq: Every day | ORAL | Status: DC
  Administered 2023-10-26 – 2023-10-28 (×3): 17 g via ORAL
  Filled 2023-10-26 (×4): qty 1

## 2023-10-26 MED ORDER — APIXABAN 5 MG PO TABS
5.0000 mg | ORAL_TABLET | Freq: Two times a day (BID) | ORAL | Status: DC
  Administered 2023-10-26 – 2023-10-30 (×9): 5 mg via ORAL
  Filled 2023-10-26 (×9): qty 1

## 2023-10-26 MED ORDER — DILTIAZEM HCL ER COATED BEADS 240 MG PO CP24
240.0000 mg | ORAL_CAPSULE | Freq: Two times a day (BID) | ORAL | Status: DC
  Administered 2023-10-26 – 2023-10-30 (×9): 240 mg via ORAL
  Filled 2023-10-26 (×9): qty 1

## 2023-10-26 NOTE — Progress Notes (Addendum)
 Patient Name: Cody Hampton Date of Encounter: 10/26/2023 [facility] Cardiologist: Soyla DELENA Merck, MD   Interval Summary  .    Patient reports feeling well this AM. HE has been in bed all morning. HR is elevated to the 140s. He has occasional fluttering in his chest, but is otherwise asymptomatic.  Vital Signs .    Vitals:   10/26/23 0120 10/26/23 0533 10/26/23 0700 10/26/23 0730  BP: (!) 164/63 (!) 140/75 (!) 148/76 131/76  Pulse: (!) 112 (!) 105 (!) 103 (!) 107  Resp: 17 (!) 21  16  Temp: 97.7 F (36.5 C) 97.9 F (36.6 C) 98.2 F (36.8 C) 98.3 F (36.8 C)  TempSrc: Oral Oral Oral Oral  SpO2: 97% 99%  93%  Weight:  (!) 186.1 kg    Height:        Intake/Output Summary (Last 24 hours) at 10/26/2023 9177 Last data filed at 10/25/2023 1848 Gross per 24 hour  Intake 354 ml  Output 600 ml  Net -246 ml      10/26/2023    5:33 AM 10/25/2023    4:58 AM 10/24/2023    3:57 AM  Last 3 Weights  Weight (lbs) 410 lb 4.4 oz 409 lb 2.8 oz 415 lb 2 oz  Weight (kg) 186.1 kg 185.6 kg 188.3 kg      Telemetry/ECG    Atrial flutter with HR in the 140s - Personally Reviewed  Physical Exam .   GEN: Obese male. Sitting comfortably in the bed in no acute distress  Neck: No JVD Cardiac:  Regular rhythm, tachycardic, no murmurs, rubs, or gallops.  Respiratory: Clear to auscultation bilaterally. Normal WOB on room air  GI: Soft, nontender, non-distended  MS: No edema in BLE   Assessment & Plan .     Atrial flutter with RVR  - This AM, patient remains in atrial flutter with HR 140s. He has been in bed all AM. Overnight, HR was better controlled and in the 80s-100s. He is overall asymptomatic. - Treatment options limited as patient is currently not on [facility], was previously intolerant of beta blockers  - Continue digoxin  0.125 mg daily - Continue increase cardizem  CD to 240 mg BID  - Previously evaluated by EP- not a candidate for ablation due to BMI. Currently not a good  candidate for AAD due to history of noncompliance. He was scheduled for cardioversion on 4/25. Unfortunately, this had to be canceled as eliquis  was held for GI bleed - CHADs-VASc 1 (HTN). Per GI- patient is OK to resume eliquis  5 mg BID. However, patient refused to resume until he speaks to GI   GI bleed  Acute blood loss anemia  - Hemoglobin as low as 8.5 this admission. Received 1 unit PRBCs 4/14 - Improved to 9.6 today  - Per Dr. Rollin- VCE without evidence of any bleeding. 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 May resume Eliquis . If bleeding recurs, he needs a CTA. His primary GI is with [facility] and he will need outpatient follow up. - Continue protonix  40 mg daily  - As above, patient does not want to resume eliquis  until he speaks to GI about results of his study. He is very anxious to resume eliquis    HTN  - Earlier this admission patient had hypotension in the setting of GI bleed. BP has since improved  - Increase cardizem  CD to 240 mg BID as above for  tachycardia   Super morbid obesity  - BMI 58.87 - Previously failed gastric bypass  - Patient was evaluated by EP, because of BMI he is currently not a candidate for ablation  - Needs outpatient sleep study   For questions or updates, please contact [facility] Please consult [email or web address] for contact info under        Signed, Rollo FABIENE Louder, PA-C   I have personally seen and examined the patient.  My HPI, Exam, and assessment and plan are below, independent of the NPP above.  Mr. Schewe has had his heart rates have been elevated to the 140s. He is currently on diltiazem  extended release and digoxin . He is not amenable to beta blockers and is not a candidate for rhythm control strategies due to his current condition.  He is concerned about the long-term use of blood thinners and wishes to consult with his gastroenterologist before  considering anticoagulation again. His CHADS-VASc score is low, but anticoagulation is not currently an option due to concerns about GI bleeding.  He is recovering from acute blood loss anemia due to a GI bleed. His hemoglobin levels have stabilized and improved following a blood transfusion, and there is no evidence of active bleeding on his capsule endoscopy. His blood pressure has recovered following the transfusion.  He has a history of super morbid obesity and is status post gastric bypass surgery. His obesity complicates his cardiovascular management as he has not tolerated XL medications.  Exam notable for  Gen: no distress, super morbid obesity   Neck: No JVD Cardiac: No Rubs or Gallops, no Murmur, regular tachycardia +2 radial pulses Respiratory: Lungs clear to auscultation anteriorly with normal respiratory rate GI: Soft, nontender, non-distended  MS: No edema;  moves all extremities Integument: Skin feels warm Neuro:  At time of evaluation, alert and oriented to person/place/time/situation   Tele: AFK RVR  In assessment and plan:   Paroxysmal atrial flutter with rapid ventricular response Mr. Gastelum has paroxysmal atrial flutter with rapid ventricular response, with heart rates elevated to the 140s. He is on diltiazem  (240 mg BID) and digoxin . He is not amenable to beta blockers and is not a candidate for rhythm control strategy. His CHADS-VASc score is low, but anticoagulation is not an option due to concerns about GI bleeding. He wishes to consult with his gastroenterologist before considering anticoagulation. Without anticoagulation, rate control strategy will be challenging. - Continue diltiazem  240 mg BID (dose increased today) - Continue digoxin   Acute blood loss anemia secondary to GI bleed Mr. Mangino is recovering from acute blood loss anemia due to a GI bleed. His hemoglobin levels have improved following a blood transfusion. There is no evidence of active bleeding on  capsule endoscopy. - Monitor hemoglobin levels, I think inpatient AC challenge is very reasonable if he is amenable  Hypertension Mr. Delia blood pressure has improved following a blood transfusion. He is on diltiazem  for hypertension management.  Super morbid obesity status post gastric bypass Mr. Bevis has super morbid obesity and has undergone gastric bypass surgery. His obesity complicates his current medical management and he is not a candidate for ablation.  Stanly Leavens, MD FASE [facility] Cardiologist [facility]  [address] [place], [address] [place], [place] [address] [phone]  11:42 AM

## 2023-10-26 NOTE — Evaluation (Signed)
 Physical Therapy Evaluation Patient Details Name: Cody Hampton MRN: [id number] DOB: 01/29/1961 Today's Date: 10/26/2023  History of Present Illness  63yo M admitted 10/22/23 with acute GIB and Afib/flutter in RVR, now returns with new black stools, palpitations, weakness, dizziness. Found to be back in Aflutter with RVR in the ED, FOBT (+) with  new GIB and admitted for care. PMH OA, depression, HTN, obesity  Clinical Impression   Still with elevated HR to 140s at rest, however bed was soiled and nursing staff was requesting help transferring to chair- able to do so with MinAx1 and standby of one for safety. Getting to full upright was a little hard but once up with RW he did very well with main limiting factor being HR today. We discussed post-acute rehab given repeat hospitalizations with limited mobility and a hx of falls at home, he much prefers aggressive PT in house with HHPT for f/u given that he is primary caregiver for his wife. Left up in recliner with all needs met, nursing staff present.         If plan is discharge home, recommend the following: Assistance with cooking/housework;A lot of help with bathing/dressing/bathroom;A little help with walking and/or transfers;Assist for transportation;Help with stairs or ramp for entrance   Can travel by private vehicle        Equipment Recommendations BSC/3in1;Rolling walker (2 wheels) (bariatric)  Recommendations for Other Services       Functional Status Assessment Patient has had a recent decline in their functional status and demonstrates the ability to make significant improvements in function in a reasonable and predictable amount of time.     Precautions / Restrictions Precautions Precautions: Fall Precaution/Restrictions Comments: monitor HR Restrictions Weight Bearing Restrictions Per Provider Order: No      Mobility  Bed Mobility Overal bed mobility: Modified Independent Bed Mobility: Supine to Sit     Supine  to sit: HOB elevated, Used rails     General bed mobility comments: with increased time.    Transfers Overall transfer level: Needs assistance Equipment used: Rolling walker (2 wheels) Transfers: Sit to/from Stand, Bed to chair/wheelchair/BSC Sit to Stand: Min assist Stand pivot transfers: Supervision         General transfer comment: MinA and Mod VC to boost up to standing but once up did really well with mobility    Ambulation/Gait               General Gait Details: deferred, elevated HR to 146 max observed at rest and with transfers  Stairs            Wheelchair Mobility     Tilt Bed    Modified Rankin (Stroke Patients Only)       Balance Overall balance assessment: Needs assistance Sitting-balance support: Bilateral upper extremity supported Sitting balance-Leahy Scale: Good     Standing balance support: Bilateral upper extremity supported, During functional activity, Reliant on assistive device for balance Standing balance-Leahy Scale: Fair                               Pertinent Vitals/Pain Pain Assessment Pain Assessment: No/denies pain    Home Living Family/patient expects to be discharged to:: Private residence Living Arrangements: Spouse/significant other;Children Available Help at Discharge: Family;Available PRN/intermittently Type of Home: House Home Access: Stairs to enter Entrance Stairs-Rails: None Entrance Stairs-Number of Steps: 1 large step   Home Layout: One level Home Equipment: Rollator (4  wheels)      Prior Function Prior Level of Function : Independent/Modified Independent;Driving               ADLs Comments: patient indicated spending majority of time in bed at home with three children living at home with him. patient reported three children have special needs with patient indicating he is the only one who drives in house with patients wife bed bound at home. TD for LB dressing at home at baseline  per patient report. No changes in equipment/DME or home set up since last hospital admission     Extremity/Trunk Assessment   Upper Extremity Assessment Upper Extremity Assessment: Defer to OT evaluation    Lower Extremity Assessment Lower Extremity Assessment: Generalized weakness    Cervical / Trunk Assessment Cervical / Trunk Assessment: Other exceptions Cervical / Trunk Exceptions: body habitus impacting ability to maintain sitting balance in upright posture without BUE support.  Communication   Communication Communication: No apparent difficulties    Cognition Arousal: Alert Behavior During Therapy: WFL for tasks assessed/performed   PT - Cognitive impairments: No apparent impairments                                 Cueing       General Comments General comments (skin integrity, edema, etc.): elevated HR limited session    Exercises     Assessment/Plan    PT Assessment Patient needs continued PT services  PT Problem List Decreased strength;Cardiopulmonary status limiting activity;Decreased activity tolerance;Decreased mobility;Decreased knowledge of precautions;Decreased balance       PT Treatment Interventions DME instruction;Therapeutic exercise;Gait training;Functional mobility training;Therapeutic activities;Patient/family education    PT Goals (Current goals can be found in the Care Plan section)  Acute Rehab PT Goals Patient Stated Goal: go home PT Goal Formulation: With patient Time For Goal Achievement: 11/09/23 Potential to Achieve Goals: Fair    Frequency Min 1X/week     Co-evaluation               AM-PAC PT "6 Clicks" Mobility  Outcome Measure Help needed turning from your back to your side while in a flat bed without using bedrails?: A Little Help needed moving from lying on your back to sitting on the side of a flat bed without using bedrails?: A Little Help needed moving to and from a bed to a chair (including a  wheelchair)?: A Little Help needed standing up from a chair using your arms (e.g., wheelchair or bedside chair)?: A Little Help needed to walk in hospital room?: A Little Help needed climbing 3-5 steps with a railing? : A Lot 6 Click Score: 17    End of Session   Activity Tolerance: Patient tolerated treatment well Patient left: in chair;with call bell/phone within reach;with nursing/sitter in room Nurse Communication: Mobility status PT Visit Diagnosis: Unsteadiness on feet (R26.81);Muscle weakness (generalized) (M62.81)    Time: 6045-4098 PT Time Calculation (min) (ACUTE ONLY): 20 min   Charges:   PT Evaluation $PT Eval Moderate Complexity: 1 Mod   PT General Charges $$ ACUTE PT VISIT: 1 Visit        Terrel Ferries, PT, DPT 10/26/23 10:26 AM

## 2023-10-26 NOTE — Progress Notes (Signed)
 PROGRESS NOTE    Cody Hampton  WUJ:[id number] DOB: 01/03/1961 DOA: 10/22/2023 PCP: Lazoff, Shawn P, DO     Brief Narrative:  Cody Hampton is a 63 y.o. male with medical history significant of atrial flutter on eliquis , HTN, HLD, GERD, MGUS, OSA, prediabetes, depression and anxiety, class 3 obesity presenting to the ED with black stools and weakness.  Found to have low hemoglobin.  Recently admitted in March 2025 for GI bleed and underwent EGD which did not show any obvious source of bleeding.  Patient hospitalized again for GI work up.   New events last 24 hours / Subjective: No complaints today. Anxious about getting his purewick removed and making it to the commode. HR 145 without symptoms. Discussed starting Eliquis  today, he was agreeable to start and monitor Hgb and stool   Assessment & Plan:   Principal Problem:   Atrial flutter with rapid ventricular response (HCC) Active Problems:   Anxiety and depression   Class 3 severe obesity due to excess calories with serious comorbidity and body mass index (BMI) greater than or equal to 70 in adult [facility])   Essential (primary) hypertension   Gastroesophageal reflux disease   OSA (obstructive sleep apnea)   S/P bariatric surgery   GI bleeding   Chronic anticoagulation   Atrial flutter with RVR Prior to admission patient was on diltiazem  for rate control.  Was on apixaban  for anticoagulation. P.o. Cardizem , digoxin .  Patient unable to tolerate bisoprolol , metoprolol  Cardiology is following. Most recent echocardiogram from January 2025 showed normal LVEF with enlarged RV but normal RV function.   GI bleed/acute blood loss anemia Hemoglobin was 13.6 earlier this month.  Presented with hemoglobin of 8.8.  Has been having dark-colored stools. Gastroenterology consulted. Patient was transfused 1 unit of PRBC 10/22/23 Remains on PPI Per Dr. Nickey Barn: VCE without evidence of any bleeding. 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 May resume Eliquis . If bleeding recurs, he needs a CTA. His primary GI is with [facility] and he will need outpatient follow up.    Hypotension Resolved    Hypocalcemia Received calcium  gluconate in the ED   Leukocytosis No obvious source of infection identified.  Could be reactive. Monitor    History of anxiety and depression Continue Celexa .   Class III obesity Estimated body mass index is 58.87 kg/m as calculated from the following:   Height as of this encounter: 5\' 10"  (1.778 m).   Weight as of this encounter: 186.1 kg.   DVT prophylaxis:  Place and maintain sequential compression device Start: 10/25/23 2310 SCDs Start: 10/22/23 1605  Code Status: Full Family Communication: None at bedside Disposition Plan:  Home  Status is: Inpatient Remains inpatient appropriate because: Awaiting improvement in HR and monitor stool starting back on Eliquis     Antimicrobials:  Anti-infectives (From admission, onward)    None        Objective: Vitals:   10/26/23 0533 10/26/23 0700 10/26/23 0730 10/26/23 1055  BP: (!) 140/75 (!) 148/76 131/76 123/84  Pulse: (!) 105 (!) 103 (!) 107 100  Resp: (!) 21  16 15   Temp: 97.9 F (36.6 C) 98.2 F (36.8 C) 98.3 F (36.8 C)   TempSrc: Oral Oral Oral   SpO2: 99%  93% 95%  Weight: (!) 186.1 kg     Height:        Intake/Output Summary (Last 24 hours) at 10/26/2023 1155 Last data filed at  10/25/2023 1848 Gross per 24 hour  Intake 177 ml  Output --  Net 177 ml   Filed Weights   10/24/23 0357 10/25/23 0458 10/26/23 0533  Weight: (!) 188.3 kg (!) 185.6 kg (!) 186.1 kg    Examination:  General exam: Appears calm and comfortable  Respiratory system: Clear to auscultation. Respiratory effort normal. No respiratory distress. No conversational dyspnea.  Cardiovascular system: S1 & S2 heard, irreg rhythm, rate 140s Gastrointestinal system: Abdomen is nondistended, soft and  nontender. Normal bowel sounds heard. Central nervous system: Alert and oriented. No focal neurological deficits. Speech clear.  Extremities: Symmetric in appearance  Skin: No rashes, lesions or ulcers on exposed skin  Psychiatry: Judgement and insight appear normal. Mood & affect appropriate.   Data Reviewed: I have personally reviewed following labs and imaging studies  CBC: Recent Labs  Lab 10/22/23 1307 10/22/23 1351 10/23/23 0321 10/23/23 1521 10/24/23 0258 10/25/23 0316 10/26/23 0243  WBC 17.2*  --  15.7* 14.5* 16.7* 17.0* 14.3*  NEUTROABS 13.7*  --   --   --   --   --   --   HGB 8.8*   < > 8.8* 8.8* 8.9* 9.1* 9.6*  HCT 28.7*   < > 27.3* 27.8* 27.1* 29.3* 30.9*  MCV 92.9  --  88.9 89.4 89.7 90.7 92.8  PLT 419*  --  391 415* 383 456* 419*   < > = values in this interval not displayed.   Basic Metabolic Panel: Recent Labs  Lab 10/22/23 1307 10/22/23 1351 10/23/23 0321 10/24/23 0258 10/25/23 0316 10/26/23 0243  NA 135 137 136 135 136 138  K 4.3 4.5 4.0 3.9 3.9 3.8  CL 106 107 108 105 104 105  CO2 20*  --  21* 22 22 22   GLUCOSE 174* 153* 131* 144* 138* 117*  BUN 22 27* 17 10 10 12   CREATININE 0.88 0.80 0.76 0.74 0.81 0.73  CALCIUM  8.1*  --  8.0* 8.2* 8.2* 8.2*  MG 1.8  --   --  2.0  --   --    GFR: Estimated Creatinine Clearance: 156 mL/min (by C-G formula based on SCr of 0.73 mg/dL). Liver Function Tests: Recent Labs  Lab 10/22/23 1307  AST 25  ALT 16  ALKPHOS 60  BILITOT 0.6  PROT 5.8*  ALBUMIN 3.0*   No results for input(s): "LIPASE", "AMYLASE" in the last 168 hours. No results for input(s): "AMMONIA" in the last 168 hours. Coagulation Profile: Recent Labs  Lab 10/22/23 1307  INR 1.3*   Cardiac Enzymes: No results for input(s): "CKTOTAL", "CKMB", "CKMBINDEX", "TROPONINI" in the last 168 hours. BNP (last 3 results) No results for input(s): "PROBNP" in the last 8760 hours. HbA1C: No results for input(s): "HGBA1C" in the last 72  hours. CBG: Recent Labs  Lab 10/23/23 0759 10/25/23 0924 10/26/23 1053  GLUCAP 133* 200* 110*   Lipid Profile: No results for input(s): "CHOL", "HDL", "LDLCALC", "TRIG", "CHOLHDL", "LDLDIRECT" in the last 72 hours. Thyroid  Function Tests: No results for input(s): "TSH", "T4TOTAL", "FREET4", "T3FREE", "THYROIDAB" in the last 72 hours. Anemia Panel: No results for input(s): "VITAMINB12", "FOLATE", "FERRITIN", "TIBC", "IRON ", "RETICCTPCT" in the last 72 hours. Sepsis Labs: No results for input(s): "PROCALCITON", "LATICACIDVEN" in the last 168 hours.  No results found for this or any previous visit (from the past 240 hours).    Radiology Studies: No results found.    Scheduled Meds:  citalopram   20 mg Oral BID   digoxin   0.125 mg Oral  Daily   diltiazem   240 mg Oral BID   pantoprazole   40 mg Oral Daily   polyethylene glycol  17 g Oral Daily   sodium chloride  flush  3-10 mL Intravenous Q12H   Continuous Infusions:   LOS: 4 days   Time spent: 25 minutes   Daren Eck, DO [facility] 10/26/2023, 11:55 AM   Available via Epic secure chat 7am-7pm After these hours, please refer to coverage provider listed on [email or web address]

## 2023-10-26 NOTE — TOC Progression Note (Addendum)
 Transition of Care [facility]) - Progression Note    Patient Details  Name: Cody Hampton MRN: [id number] Date of Birth: Dec 07, 1960  Transition of Care [facility]) CM/SW Contact  Jennett Model, RN Phone Number: 10/26/2023, 4:35 PM  Clinical Narrative:    From home with spouse, has PCP and insurance on file, states has no HH services in place at this time , he has a rollator and a rolling walker , a cane and a cpap machine at home.  States family member will transport them home at [employer] and family is support system, states gets medications from  [facility] on [address] .  Pta self ambulatory with rollator.  He also still drives.  Per pt eval rec HHPT, NCM offered choice, he chose [facility] since he had them in the past,  NCM made referral to Artavia with [facility], she will check to see if she can take referral and call this NCM back.  Per call from Artavia, she can take referrral.  Soc will begin on Monday.          Expected Discharge Plan and Services                                               Social Determinants of Health (SDOH) Interventions SDOH Screenings   Food Insecurity: No Food Insecurity (10/23/2023)  Housing: Low Risk  (10/23/2023)  Transportation Needs: No Transportation Needs (10/23/2023)  Utilities: Not At Risk (10/23/2023)  Financial Resource Strain: Low Risk  (01/10/2022)   [facility] prior to 09/09/2022[facility] visits prior to 09/09/2022., [facility]  Physical Activity: Insufficiently Active (01/10/2022)   [facility], [facility] prior to 09/09/2022., [facility] visits prior to 09/09/2022., [facility]  Social Connections: Unknown (01/10/2022)   [facility] visits prior to 09/09/2022., [facility], [facility] visits prior to 09/09/2022., [facility]  Stress: Stress Concern Present (01/10/2022)   [facility], [facility] visits prior to 09/09/2022., [facility], [facility] visits prior to 09/09/2022.  Tobacco Use: Low Risk  (10/22/2023)    Readmission Risk Interventions     No data to display

## 2023-10-27 DIAGNOSIS — E66813 Obesity, class 3: Secondary | ICD-10-CM | POA: Diagnosis not present

## 2023-10-27 DIAGNOSIS — I4892 Unspecified atrial flutter: Secondary | ICD-10-CM | POA: Diagnosis not present

## 2023-10-27 DIAGNOSIS — Z6841 Body Mass Index (BMI) 40.0 and over, adult: Secondary | ICD-10-CM

## 2023-10-27 DIAGNOSIS — K922 Gastrointestinal hemorrhage, unspecified: Secondary | ICD-10-CM | POA: Diagnosis not present

## 2023-10-27 LAB — BASIC METABOLIC PANEL WITH GFR
Anion gap: 10 (ref 5–15)
BUN: 14 mg/dL (ref 8–23)
CO2: 23 mmol/L (ref 22–32)
Calcium: 8 mg/dL — ABNORMAL LOW (ref 8.9–10.3)
Chloride: 102 mmol/L (ref 98–111)
Creatinine, Ser: 0.67 mg/dL (ref 0.61–1.24)
GFR, Estimated: >60 mL/min (ref >60)
Glucose, Bld: 112 mg/dL — ABNORMAL HIGH (ref 70–99)
Potassium: 3.5 mmol/L (ref 3.5–5.1)
Sodium: 135 mmol/L (ref 135–145)

## 2023-10-27 LAB — GLUCOSE, CAPILLARY: Glucose-Capillary: 128 mg/dL — ABNORMAL HIGH (ref 70–99)

## 2023-10-27 LAB — CBC
HCT: 28.1 % — ABNORMAL LOW (ref 39.0–52.0)
Hemoglobin: 8.9 g/dL — ABNORMAL LOW (ref 13.0–17.0)
MCH: 28.8 pg (ref 26.0–34.0)
MCHC: 31.7 g/dL (ref 30.0–36.0)
MCV: 90.9 fL (ref 80.0–100.0)
Platelets: 472 10*3/uL — ABNORMAL HIGH (ref 150–400)
RBC: 3.09 MIL/uL — ABNORMAL LOW (ref 4.22–5.81)
RDW: 15 % (ref 11.5–15.5)
WBC: 14.2 10*3/uL — ABNORMAL HIGH (ref 4.0–10.5)
nRBC: 0.2 % (ref 0.0–0.2)

## 2023-10-27 MED ORDER — OXYCODONE-ACETAMINOPHEN 5-325 MG PO TABS
1.0000 | ORAL_TABLET | Freq: Four times a day (QID) | ORAL | Status: DC | PRN
  Administered 2023-10-27 – 2023-10-28 (×2): 1 via ORAL
  Filled 2023-10-27 (×2): qty 1

## 2023-10-27 NOTE — Progress Notes (Signed)
 Cardiology Progress Note  Patient ID: Cody Hampton MRN: [id number] DOB: [birth year] Date of Encounter: 10/27/2023 Primary Cardiologist: Euell Herrlich, MD  Subjective   Chief Complaint: None.   HPI: Remains in atrial flutter versus fibrillation.  Heart rates 110-120s.  ROS:  All other ROS reviewed and negative. Pertinent positives noted in the HPI.     Telemetry  Overnight telemetry shows A-fib heart rate 110 to 120 bpm, which I personally reviewed.   Physical Exam   Vitals:   10/26/23 1653 10/26/23 1952 10/27/23 0039 10/27/23 0412  BP:  124/69 (!) 142/66 124/67  Pulse:  (!) 105 (!) 104 (!) 110  Resp:  19 17 19   Temp:  98 F (36.7 C) 98.3 F (36.8 C) 98.1 F (36.7 C)  TempSrc: Oral Oral Oral Oral  SpO2:  97% 97% 98%  Weight:    (!) 184.4 kg  Height:        Intake/Output Summary (Last 24 hours) at 10/27/2023 0826 Last data filed at 10/27/2023 0041 Gross per 24 hour  Intake 177 ml  Output 675 ml  Net -498 ml       10/27/2023    4:12 AM 10/26/2023    5:33 AM 10/25/2023    4:58 AM  Last 3 Weights  Weight (lbs) 406 lb 8.5 oz 410 lb 4.4 oz 409 lb 2.8 oz  Weight (kg) 184.4 kg 186.1 kg 185.6 kg    Body mass index is 58.33 kg/m.  General: Obese male, no acute distress Head: Atraumatic, normal size  Eyes: PEERLA, EOMI  Neck: Supple, no JVD Endocrine: No thryomegaly Cardiac: Normal S1, S2; tachycardia, irregular rhythm noted Lungs: Clear to auscultation bilaterally, no wheezing, rhonchi or rales  Abd: Soft, nontender, no hepatomegaly  Ext: No edema, pulses 2+ Musculoskeletal: No deformities, BUE and BLE strength normal and equal Skin: Warm and dry, no rashes   Neuro: Alert and oriented to person, place, time, and situation, CNII-XII grossly intact, no focal deficits  Psych: Normal mood and affect   Cardiac Studies  TTE 08/07/2023  1. Left ventricular ejection fraction, by estimation, is 60 to 65%. The  left ventricle has normal function. The left ventricle has  no regional  wall motion abnormalities. There is mild asymmetric left ventricular  hypertrophy of the septal segment. Left  ventricular diastolic parameters were normal.   2. Right ventricular systolic function is normal. The right ventricular  size is severely enlarged. There is normal pulmonary artery systolic  pressure.   3. Right atrial size was mildly dilated.   4. The mitral valve is normal in structure. No evidence of mitral valve  regurgitation. No evidence of mitral stenosis.   5. The aortic valve is tricuspid. Aortic valve regurgitation is not  visualized. No aortic stenosis is present.   Patient Profile  Cody Hampton is a 63 y.o. male with morbid obesity (BMI 58), OSA, atrial flutter on Eliquis , hypertension, hyperlipidemia who was admitted on 10/22/2023 for acute GI bleed and atrial flutter.  Assessment & Plan   # Atrial flutter with RVR - Currently admitted with GI bleed.  EGD without source of bleeding.  Eliquis  has been restarted.  Will watch him closely as we restart this. - Plan is to rate control is atrial flutter and as long as anemia and no further bleeding could be considered for outpatient cardioversion.  I think the best approach is to control his heart rate and to ensure that he will be able to tolerate uninterrupted anticoagulation.  We did discuss that any cardioversion would require uninterrupted anticoagulation.  I am a bit worried about pursuing this currently.  I think the best course of action is rate control with reevaluation as an outpatient. - Atrial flutter is very difficult to control.  Would continue diltiazem  240 mg CD capsule twice daily.  Continue digoxin  0.125 mg daily. - He is intolerant of metoprolol .  Could consider bisoprolol  or other beta-blocker if rates are not improved.  As long as his rates are around 110 I think this is the best we will be able to do. - We will await further guidance from GI on that he will need further procedures.  We can  see how he does with Eliquis .  # Acute GI bleed - EGD without source of bleeding.  Restarting Eliquis .  # Morbid obesity, BMI 58 - Complicates matters.  Not a candidate for aggressive cardiovascular care.      For questions or updates, please contact [facility] Please consult [email or web address] for contact info under         Signed, Melodee Spruce T. Rolm Clos, MD, [facility]   [facility]  10/27/2023 8:26 AM

## 2023-10-27 NOTE — Plan of Care (Signed)
 Pt is able to get out of bed with minimal assistance and use of walker to ambulate

## 2023-10-27 NOTE — Progress Notes (Signed)
 Occupational Therapy Treatment Patient Details Name: Cody Hampton MRN: [id number] DOB: [birth year] Today's Date: 10/27/2023   History of present illness 63yo M admitted 10/22/23 with acute GIB and Afib/flutter in RVR, now returns with new black stools, palpitations, weakness, dizziness. Found to be back in Aflutter with RVR in the ED, FOBT (+) with  new GIB and admitted for care. PMH OA, depression, HTN, obesity   OT comments  Pt seen for f/u session to work on bed mobility and discuss energy conservation strategies. D/c plan remains appropriate.      If plan is discharge home, recommend the following:  A little help with walking and/or transfers;A little help with bathing/dressing/bathroom;A lot of help with bathing/dressing/bathroom;Assist for transportation;Help with stairs or ramp for entrance   Equipment Recommendations  None recommended by OT    Recommendations for Other Services      Precautions / Restrictions Precautions Precautions: Fall Precaution/Restrictions Comments: monitor HR Restrictions Weight Bearing Restrictions Per Provider Order: No       Mobility Bed Mobility Overal bed mobility: Needs Assistance Bed Mobility: Sit to Supine     Supine to sit: HOB elevated, Used rails Sit to supine: Contact guard assist   General bed mobility comments: with increased time.    Transfers Overall transfer level: Needs assistance Equipment used: Rolling walker (2 wheels) Transfers: Sit to/from Stand Sit to Stand: Min assist           General transfer comment: to/from EOB, bed height elevated. pt side stepped and took pivotal steps at bedside. Min A to steady during pivotal steps and during powerup to standing.     Balance Overall balance assessment: Needs assistance Sitting-balance support: Bilateral upper extremity supported Sitting balance-Leahy Scale: Good     Standing balance support: Bilateral upper extremity supported, During functional activity,  Reliant on assistive device for balance Standing balance-Leahy Scale: Poor Standing balance comment: fair static, poor dynamic                           ADL either performed or assessed with clinical judgement   ADL Overall ADL's : Needs assistance/impaired Eating/Feeding: Independent   Grooming: Sitting;Set up   Upper Body Bathing: Sitting;Minimal assistance   Lower Body Bathing: Moderate assistance;Sit to/from stand   Upper Body Dressing : Sitting;Minimal assistance   Lower Body Dressing: Moderate assistance;Sit to/from stand   Toilet Transfer: Minimal Cabin crew Details (indicate cue type and reason): HR up to 153 while taking pivotal steps Toileting- Clothing Manipulation and Hygiene: Moderate assistance;Sit to/from stand         General ADL Comments: Provided and reviewed energy conservation handout as pertains to ADL managment.    Extremity/Trunk Assessment Upper Extremity Assessment Upper Extremity Assessment: Generalized weakness   Lower Extremity Assessment Lower Extremity Assessment: Defer to PT evaluation   Cervical / Trunk Assessment Cervical / Trunk Assessment: Other exceptions Cervical / Trunk Exceptions: large body habitus    Vision       Perception     Praxis     Communication Communication Communication: No apparent difficulties   Cognition Arousal: Alert Behavior During Therapy: WFL for tasks assessed/performed Cognition: No apparent impairments                               Following commands: Intact        Cueing      Exercises  Shoulder Instructions       General Comments elevated HR limited session. MD arrived while pt standing at bedside. Helped to sit EOB for MD assessment.    Pertinent Vitals/ Pain       Pain Assessment Pain Assessment: No/denies pain  Home Living Family/patient expects to be discharged to:: Private residence Living Arrangements:  Spouse/significant other;Children Available Help at Discharge: Family;Available PRN/intermittently Type of Home: House Home Access: Stairs to enter [employer] of Steps: 1 large step Entrance Stairs-Rails: None Home Layout: One level     Bathroom Shower/Tub: Runner, broadcasting/film/video: Rollator (4 wheels);BSC/3in1;Hand held shower head   Additional Comments: lives with spouse who is bedbound per pt report, 2 adult sons in the home.      Prior Functioning/Environment              Frequency  Min 2X/week        Progress Toward Goals  OT Goals(current goals can now be found in the care plan section)  Progress towards OT goals: Progressing toward goals  Acute Rehab OT Goals Patient Stated Goal: home OT Goal Formulation: With patient Time For Goal Achievement: 11/10/23 Potential to Achieve Goals: Good ADL Goals Pt Will Perform Upper Body Dressing: with set-up;sitting Pt Will Perform Lower Body Dressing: with min assist;sit to/from stand Pt Will Transfer to Toilet: with contact guard assist;ambulating Pt Will Perform Toileting - Clothing Manipulation and hygiene: with min assist;sit to/from stand  Plan      Co-evaluation                 AM-PAC OT "6 Clicks" Daily Activity     Outcome Measure   Help from another person eating meals?: None Help from another person taking care of personal grooming?: A Little Help from another person toileting, which includes using toliet, bedpan, or urinal?: A Little Help from another person bathing (including washing, rinsing, drying)?: A Lot Help from another person to put on and taking off regular upper body clothing?: A Little Help from another person to put on and taking off regular lower body clothing?: A Lot 6 Click Score: 17    End of Session Equipment Utilized During Treatment: Rolling walker (2 wheels)  OT Visit Diagnosis: Unsteadiness on feet (R26.81);Other abnormalities of gait and  mobility (R26.89)   Activity Tolerance   Patient Left in bed;with call bell/phone within reach;with bed alarm set;with SCD's reapplied;Other (comment)   Nurse Communication          Time: 1610-9604 OT Time Calculation (min): 8 min  Charges: OT General Charges $OT Visit: 1 Visit   Lael Pierce, OT Acute Rehabilitation Services  Office: [phone]   Brinton Canavan 10/27/2023, 10:57 AM

## 2023-10-27 NOTE — Progress Notes (Signed)
 PROGRESS NOTE    Cody Hampton  ZOX:[id number] DOB: 20-Mar-1961 DOA: 10/22/2023 PCP: Lazoff, Shawn P, DO     Brief Narrative:  Cody Hampton is a 63 y.o. male with medical history significant of atrial flutter on eliquis , HTN, HLD, GERD, MGUS, OSA, prediabetes, depression and anxiety, class 3 obesity presenting to the ED with black stools and weakness.  Found to have low hemoglobin.  Recently admitted in March 2025 for GI bleed and underwent EGD which did not show any obvious source of bleeding.  Patient hospitalized again for GI work up.  He underwent video capsule endoscopy which did not show any evidence of GI bleeding.  He was restarted on Eliquis  4/18.  New events last 24 hours / Subjective: Patient getting up to the side of bed with OT this morning.  Heart rate 145.  He tells me that he takes naproxen daily at home for pain.  We discussed that he needs to stop all naproxen/NSAID/ibuprofen over-the-counter indefinitely.  Discussed using Tylenol  or oxycodone  for pain control.  Had a bowel movement this morning which was not black or bloody.  Assessment & Plan:   Principal Problem:   Atrial flutter with rapid ventricular response (HCC) Active Problems:   Anxiety and depression   Class 3 severe obesity due to excess calories with serious comorbidity and body mass index (BMI) greater than or equal to 70 in adult [facility])   Essential (primary) hypertension   Gastroesophageal reflux disease   OSA (obstructive sleep apnea)   S/P bariatric surgery   GI bleeding   Chronic anticoagulation   Atrial flutter with RVR Prior to admission patient was on diltiazem  for rate control.  Was on apixaban  for anticoagulation. Most recent echocardiogram from January 2025 showed normal LVEF with enlarged RV but normal RV function. P.o. Cardizem , digoxin .  Patient unable to tolerate bisoprolol , metoprolol  Resumed on Eliquis  Cardiology is following.   GI bleed/acute blood loss anemia Hemoglobin was  13.6 earlier this month.  Presented with hemoglobin of 8.8.  Has been having dark-colored stools. Gastroenterology consulted. Patient was transfused 1 unit of PRBC 10/22/23 Remains on PPI Per Dr. Nickey Barn: VCE without evidence of any bleeding. 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 May resume Eliquis . If bleeding recurs, he needs a CTA. His primary GI is with [facility] and he will need outpatient follow up.    Hypotension Resolved    Hypocalcemia Received calcium  gluconate in the ED   Leukocytosis No obvious source of infection identified.  Could be reactive. Monitor    History of anxiety and depression Continue Celexa .   Class III obesity Estimated body mass index is 58.33 kg/m as calculated from the following:   Height as of this encounter: 5\' 10"  (1.778 m).   Weight as of this encounter: 184.4 kg.   DVT prophylaxis:  Place and maintain sequential compression device Start: 10/25/23 2310 SCDs Start: 10/22/23 1605 apixaban  (ELIQUIS ) tablet 5 mg  Code Status: Full Family Communication: None at bedside Disposition Plan:  Home  Status is: Inpatient Remains inpatient appropriate because: Awaiting improvement in HR and monitor stool starting back on Eliquis     Antimicrobials:  Anti-infectives (From admission, onward)    None        Objective: Vitals:   10/27/23 0039 10/27/23 0412 10/27/23 0833 10/27/23 0838  BP: (!) 142/66 124/67  132/85  Pulse: (!) 104 (!) 110 (!) 124   Resp: 17 19  15  Temp: 98.3 F (36.8 C) 98.1 F (36.7 C)  97.9 F (36.6 C)  TempSrc: Oral Oral  Oral  SpO2: 97% 98%  99%  Weight:  (!) 184.4 kg    Height:        Intake/Output Summary (Last 24 hours) at 10/27/2023 1051 Last data filed at 10/27/2023 0041 Gross per 24 hour  Intake 177 ml  Output 675 ml  Net -498 ml   Filed Weights   10/25/23 0458 10/26/23 0533 10/27/23 0412  Weight: (!) 185.6 kg (!) 186.1 kg (!) 184.4 kg     Examination: General exam: Appears calm and comfortable  Respiratory system: Clear to auscultation. Respiratory effort normal.   Cardiovascular system: S1 & S2 heard, tachycardic rate 125.  Gastrointestinal system: Abdomen is nondistended, soft and nontender. Normal bowel sounds heard. Central nervous system: Alert and oriented. Non focal exam. Speech clear  Extremities: Symmetric in appearance bilaterally  Skin: No rashes, lesions or ulcers on exposed skin  Psychiatry: Judgement and insight appear stable. Mood & affect appropriate.    Data Reviewed: I have personally reviewed following labs and imaging studies  CBC: Recent Labs  Lab 10/22/23 1307 10/22/23 1351 10/23/23 1521 10/24/23 0258 10/25/23 0316 10/26/23 0243 10/27/23 0321  WBC 17.2*   < > 14.5* 16.7* 17.0* 14.3* 14.2*  NEUTROABS 13.7*  --   --   --   --   --   --   HGB 8.8*   < > 8.8* 8.9* 9.1* 9.6* 8.9*  HCT 28.7*   < > 27.8* 27.1* 29.3* 30.9* 28.1*  MCV 92.9   < > 89.4 89.7 90.7 92.8 90.9  PLT 419*   < > 415* 383 456* 419* 472*   < > = values in this interval not displayed.   Basic Metabolic Panel: Recent Labs  Lab 10/22/23 1307 10/22/23 1351 10/23/23 0321 10/24/23 0258 10/25/23 0316 10/26/23 0243 10/27/23 0321  NA 135   < > 136 135 136 138 135  K 4.3   < > 4.0 3.9 3.9 3.8 3.5  CL 106   < > 108 105 104 105 102  CO2 20*  --  21* 22 22 22 23   GLUCOSE 174*   < > 131* 144* 138* 117* 112*  BUN 22   < > 17 10 10 12 14   CREATININE 0.88   < > 0.76 0.74 0.81 0.73 0.67  CALCIUM  8.1*  --  8.0* 8.2* 8.2* 8.2* 8.0*  MG 1.8  --   --  2.0  --   --   --    < > = values in this interval not displayed.   GFR: Estimated Creatinine Clearance: 155.2 mL/min (by C-G formula based on SCr of 0.67 mg/dL). Liver Function Tests: Recent Labs  Lab 10/22/23 1307  AST 25  ALT 16  ALKPHOS 60  BILITOT 0.6  PROT 5.8*  ALBUMIN 3.0*   No results for input(s): "LIPASE", "AMYLASE" in the last 168 hours. No results for  input(s): "AMMONIA" in the last 168 hours. Coagulation Profile: Recent Labs  Lab 10/22/23 1307  INR 1.3*   Cardiac Enzymes: No results for input(s): "CKTOTAL", "CKMB", "CKMBINDEX", "TROPONINI" in the last 168 hours. BNP (last 3 results) No results for input(s): "PROBNP" in the last 8760 hours. HbA1C: No results for input(s): "HGBA1C" in the last 72 hours. CBG: Recent Labs  Lab 10/23/23 0759 10/25/23 0924 10/26/23 1053 10/27/23 0643  GLUCAP 133* 200* 110* 128*   Lipid Profile: No results for input(s): "CHOL", "  HDL", "LDLCALC", "TRIG", "CHOLHDL", "LDLDIRECT" in the last 72 hours. Thyroid  Function Tests: No results for input(s): "TSH", "T4TOTAL", "FREET4", "T3FREE", "THYROIDAB" in the last 72 hours. Anemia Panel: No results for input(s): "VITAMINB12", "FOLATE", "FERRITIN", "TIBC", "IRON ", "RETICCTPCT" in the last 72 hours. Sepsis Labs: No results for input(s): "PROCALCITON", "LATICACIDVEN" in the last 168 hours.  No results found for this or any previous visit (from the past 240 hours).    Radiology Studies: No results found.    Scheduled Meds:  apixaban   5 mg Oral BID   citalopram   20 mg Oral BID   digoxin   0.125 mg Oral Daily   diltiazem   240 mg Oral BID   pantoprazole   40 mg Oral Daily   polyethylene glycol  17 g Oral Daily   sodium chloride  flush  3-10 mL Intravenous Q12H   Continuous Infusions:   LOS: 5 days   Time spent: 25 minutes   Daren Eck, DO [facility] 10/27/2023, 10:51 AM   Available via Epic secure chat 7am-7pm After these hours, please refer to coverage provider listed on [email or web address]

## 2023-10-27 NOTE — Evaluation (Signed)
 Occupational Therapy Evaluation Patient Details Name: Nicasio Barlowe MRN: [id number] DOB: 10/13/1960 Today's Date: 10/27/2023   History of Present Illness   63yo M admitted 10/22/23 with acute GIB and Afib/flutter in RVR, now returns with new black stools, palpitations, weakness, dizziness. Found to be back in Aflutter with RVR in the ED, FOBT (+) with  new GIB and admitted for care. PMH OA, depression, HTN, obesity     Clinical Impressions Pt admitted with the above diagnoses and presents with below problem list. Pt will benefit from continued acute OT to address the below listed deficits and maximize independence with basic ADLs prior to d/c. At baseline, pt lives with his spouse who is bedbound and 2 adult sons who assist with pt's LB ADLs and bathing. Pt stood, sidestepped and took pivotal steps at bed side. HR 128 at start of session, 140s standing bedside, up to 153 taking pivotal steps. Pt EOB with MD assessing at end of session.      If plan is discharge home, recommend the following:   A little help with walking and/or transfers;A little help with bathing/dressing/bathroom;A lot of help with bathing/dressing/bathroom;Assist for transportation;Help with stairs or ramp for entrance     Functional Status Assessment   Patient has had a recent decline in their functional status and demonstrates the ability to make significant improvements in function in a reasonable and predictable amount of time.     Equipment Recommendations   None recommended by OT     Recommendations for Other Services         Precautions/Restrictions   Precautions Precautions: Fall Precaution/Restrictions Comments: monitor HR Restrictions Weight Bearing Restrictions Per Provider Order: No     Mobility Bed Mobility Overal bed mobility: Modified Independent Bed Mobility: Supine to Sit     Supine to sit: HOB elevated, Used rails     General bed mobility comments: with increased  time.    Transfers Overall transfer level: Needs assistance Equipment used: Rolling walker (2 wheels) Transfers: Sit to/from Stand Sit to Stand: Min assist           General transfer comment: to/from EOB, bed height elevated. pt side stepped and took pivotal steps at bedside. Min A to steady during pivotal steps and during powerup to standing.      Balance Overall balance assessment: Needs assistance Sitting-balance support: Bilateral upper extremity supported Sitting balance-Leahy Scale: Good     Standing balance support: Bilateral upper extremity supported, During functional activity, Reliant on assistive device for balance Standing balance-Leahy Scale: Poor Standing balance comment: fair static, poor dynamic                           ADL either performed or assessed with clinical judgement   ADL Overall ADL's : Needs assistance/impaired Eating/Feeding: Independent   Grooming: Sitting;Set up   Upper Body Bathing: Sitting;Minimal assistance   Lower Body Bathing: Moderate assistance;Sit to/from stand   Upper Body Dressing : Sitting;Minimal assistance   Lower Body Dressing: Moderate assistance;Sit to/from stand   Toilet Transfer: Minimal Cabin crew Details (indicate cue type and reason): HR up to 153 while taking pivotal steps Toileting- Clothing Manipulation and Hygiene: Moderate assistance;Sit to/from stand         General ADL Comments: Pt stood, sidestepped and took pivotal steps at bed side. HR 128 at start of session, 140s standing bedside, up to 153 taking pivotal steps.     Vision  Perception         Praxis         Pertinent Vitals/Pain Pain Assessment Pain Assessment: No/denies pain     Extremity/Trunk Assessment Upper Extremity Assessment Upper Extremity Assessment: Generalized weakness   Lower Extremity Assessment Lower Extremity Assessment: Defer to PT evaluation   Cervical / Trunk  Assessment Cervical / Trunk Assessment: Other exceptions Cervical / Trunk Exceptions: large body habitus   Communication Communication Communication: No apparent difficulties   Cognition Arousal: Alert Behavior During Therapy: WFL for tasks assessed/performed Cognition: No apparent impairments                               Following commands: Intact       Cueing  General Comments      elevated HR limited session. MD arrived while pt standing at bedside. Helped to sit EOB for MD assessment.   Exercises     Shoulder Instructions      Home Living Family/patient expects to be discharged to:: Private residence Living Arrangements: Spouse/significant other;Children Available Help at Discharge: Family;Available PRN/intermittently Type of Home: House Home Access: Stairs to enter [employer] of Steps: 1 large step Entrance Stairs-Rails: None Home Layout: One level     Bathroom Shower/Tub: Runner, broadcasting/film/video: Rollator (4 wheels);BSC/3in1;Hand held shower head   Additional Comments: lives with spouse who is bedbound per pt report, 2 adult sons in the home.      Prior Functioning/Environment Prior Level of Function : Independent/Modified Independent;Driving               ADLs Comments: PT note: patient indicated spending majority of time in bed at home with three children living at home with him. patient reported three children have special needs with patient indicating he is the only one who drives in house with patients wife bed bound at home. TD for LB dressing at home at baseline per patient report. No changes in equipment/DME or home set up since last hospital admission    OT Problem List: Impaired balance (sitting and/or standing);Decreased knowledge of precautions;Cardiopulmonary status limiting activity;Decreased safety awareness;Decreased activity tolerance   OT Treatment/Interventions: Self-care/ADL training;DME  and/or AE instruction;Therapeutic activities;Balance training;Therapeutic exercise;Energy conservation;Patient/family education      OT Goals(Current goals can be found in the care plan section)   Acute Rehab OT Goals Patient Stated Goal: home OT Goal Formulation: With patient Time For Goal Achievement: 11/10/23 Potential to Achieve Goals: Fair ADL Goals Pt Will Perform Upper Body Dressing: with set-up;sitting Pt Will Perform Lower Body Dressing: with min assist;sit to/from stand Pt Will Transfer to Toilet: with contact guard assist;ambulating Pt Will Perform Toileting - Clothing Manipulation and hygiene: with min assist;sit to/from stand   OT Frequency:  Min 2X/week    Co-evaluation              AM-PAC OT "6 Clicks" Daily Activity     Outcome Measure Help from another person eating meals?: None Help from another person taking care of personal grooming?: A Little Help from another person toileting, which includes using toliet, bedpan, or urinal?: A Little Help from another person bathing (including washing, rinsing, drying)?: A Lot Help from another person to put on and taking off regular upper body clothing?: A Little Help from another person to put on and taking off regular lower body clothing?: A Lot 6 Click Score: 17   End  of Session Equipment Utilized During Treatment: Rolling walker (2 wheels)  Activity Tolerance: Other (comment) (HR mostly 140s, up to 153 with minimal OOB activity. Pt reported feeling a bit off immediately after supine>sit. Few minutes rest break before standing.) Patient left: in bed;with call bell/phone within reach;with bed alarm set;with SCD's reapplied;Other (comment) (with MD)  OT Visit Diagnosis: Unsteadiness on feet (R26.81);Other abnormalities of gait and mobility (R26.89)                Time: 1308-6578 OT Time Calculation (min): 19 min Charges:  OT General Charges $OT Visit: 1 Visit OT Evaluation $OT Eval Moderate Complexity: 1  Mod  Lael Pierce, OT Acute Rehabilitation Services Office: [phone]   Brinton Canavan 10/27/2023, 10:50 AM

## 2023-10-27 NOTE — Progress Notes (Signed)
 Physical Therapy Treatment Patient Details Name: Cody Hampton MRN: [id number] DOB: 1961/06/20 Today's Date: 10/27/2023   History of Present Illness 62yo M admitted 10/22/23 with acute GIB and Afib/flutter in RVR, now returns with new black stools, palpitations, weakness, dizziness. Found to be back in Aflutter with RVR in the ED, FOBT (+) with  new GIB and admitted for care. PMH OA, depression, HTN, obesity    PT Comments  Pt in bed upon arrival and agreeable to PT session. Pt continues to be limited by elevated HR with minimal activity. With light LE exercises, HR would elevate to 130 BPM. Pt was able to perform x3 stands with increased rest breaks in between due to HR elevating to 150 BPM. Pt required CGA to stand with RW. He was able to stand for 1 minute before becoming fatigued and needing to return to sitting. Current d/c recs remain appropriate pending continued progress with mobility. Acute PT to follow.      If plan is discharge home, recommend the following: Assistance with cooking/housework;A lot of help with bathing/dressing/bathroom;A little help with walking and/or transfers;Assist for transportation;Help with stairs or ramp for entrance     Equipment Recommendations  BSC/3in1;Rolling walker (2 wheels) (bariatric)       Precautions / Restrictions Precautions Precautions: Fall Precaution/Restrictions Comments: monitor HR Restrictions Weight Bearing Restrictions Per Provider Order: No     Mobility  Bed Mobility Overal bed mobility: Modified Independent Bed Mobility: Supine to Sit, Sit to Supine    Supine to sit: HOB elevated, Used rails    General bed mobility comments: with increased time.    Transfers Overall transfer level: Needs assistance Equipment used: Rolling walker (2 wheels) Transfers: Sit to/from Stand Sit to Stand: Contact guard assist  General transfer comment: CGA for safety, x3 standing with increased rest breaks in between. Able to stand for ~1  min before becoming fatigued and returning to EOB    Ambulation/Gait  General Gait Details: deferred, elevated HR to 150 with standing     Balance Overall balance assessment: Needs assistance Sitting-balance support: Bilateral upper extremity supported Sitting balance-Leahy Scale: Good     Standing balance support: Bilateral upper extremity supported, During functional activity, Reliant on assistive device for balance Standing balance-Leahy Scale: Poor Standing balance comment: fair static, poor dynamic       Communication Communication Communication: No apparent difficulties  Cognition Arousal: Alert Behavior During Therapy: WFL for tasks assessed/performed   PT - Cognitive impairments: No apparent impairments      Following commands: Intact      Cueing Cueing Techniques: Verbal cues  Exercises General Exercises - Lower Extremity Ankle Circles/Pumps: AROM, Both, 10 reps, Supine Long Arc Quad: AROM, Both, 10 reps, Seated (2x5) Hip ABduction/ADduction: AROM, Both, 10 reps, Supine Hip Flexion/Marching: AROM, Both, 10 reps, Seated (2x5)        Pertinent Vitals/Pain Pain Assessment Pain Assessment: No/denies pain    Home Living Family/patient expects to be discharged to:: Private residence Living Arrangements: Spouse/significant other;Children Available Help at Discharge: Family;Available PRN/intermittently Type of Home: House Home Access: Stairs to enter Entrance Stairs-Rails: None Entrance Stairs-Number of Steps: 1 large step   Home Layout: One level Home Equipment: Rollator (4 wheels);BSC/3in1;Hand held shower head Additional Comments: lives with spouse who is bedbound per pt report, 2 adult sons in the home.        PT Goals (current goals can now be found in the care plan section) Acute Rehab PT Goals PT Goal Formulation: With patient  Time For Goal Achievement: 11/09/23 Potential to Achieve Goals: Fair Progress towards PT goals: Progressing toward  goals    Frequency    Min 2X/week       AM-PAC PT "6 Clicks" Mobility   Outcome Measure  Help needed turning from your back to your side while in a flat bed without using bedrails?: None Help needed moving from lying on your back to sitting on the side of a flat bed without using bedrails?: None Help needed moving to and from a bed to a chair (including a wheelchair)?: A Little Help needed standing up from a chair using your arms (e.g., wheelchair or bedside chair)?: A Little Help needed to walk in hospital room?: A Lot Help needed climbing 3-5 steps with a railing? : A Lot 6 Click Score: 18    End of Session Equipment Utilized During Treatment: Gait belt Activity Tolerance: Treatment limited secondary to medical complications (Comment) (elevated HR) Patient left: in bed;with call bell/phone within reach Nurse Communication: Mobility status PT Visit Diagnosis: Unsteadiness on feet (R26.81);Muscle weakness (generalized) (M62.81)     Time: 1610-9604 PT Time Calculation (min) (ACUTE ONLY): 40 min  Charges:    $Therapeutic Exercise: 8-22 mins $Therapeutic Activity: 23-37 mins PT General Charges $$ ACUTE PT VISIT: 1 Visit                     Cody Hampton, PT, DPT Secure Chat Preferred  Rehab Office [phone]    Cody Hampton 10/27/2023, 4:32 PM

## 2023-10-28 DIAGNOSIS — I4892 Unspecified atrial flutter: Secondary | ICD-10-CM | POA: Diagnosis not present

## 2023-10-28 LAB — CBC
HCT: 29.1 % — ABNORMAL LOW (ref 39.0–52.0)
Hemoglobin: 9.1 g/dL — ABNORMAL LOW (ref 13.0–17.0)
MCH: 28.3 pg (ref 26.0–34.0)
MCHC: 31.3 g/dL (ref 30.0–36.0)
MCV: 90.4 fL (ref 80.0–100.0)
Platelets: 495 10*3/uL — ABNORMAL HIGH (ref 150–400)
RBC: 3.22 MIL/uL — ABNORMAL LOW (ref 4.22–5.81)
RDW: 14.8 % (ref 11.5–15.5)
WBC: 14 10*3/uL — ABNORMAL HIGH (ref 4.0–10.5)
nRBC: 0.1 % (ref 0.0–0.2)

## 2023-10-28 LAB — GLUCOSE, CAPILLARY: Glucose-Capillary: 117 mg/dL — ABNORMAL HIGH (ref 70–99)

## 2023-10-28 MED ORDER — SODIUM CHLORIDE 0.9 % IV SOLN: INTRAVENOUS | Status: DC

## 2023-10-28 NOTE — Progress Notes (Signed)
 Cardiology Progress Note  Patient ID: Cody Hampton MRN: [id number] DOB: 1960/10/09 Date of Encounter: 10/28/2023 Primary Cardiologist: Euell Herrlich, MD  Subjective   Chief Complaint: SOB  HPI: HR poorly controlled. HGB stable.   ROS:  All other ROS reviewed and negative. Pertinent positives noted in the HPI.     Telemetry  Overnight telemetry shows Aflutter 140s, which I personally reviewed.   Physical Exam   Vitals:   10/27/23 2000 10/28/23 0038 10/28/23 0426 10/28/23 0750  BP: 139/65 (!) 143/52 125/72 (!) 126/54  Pulse: 81 85 95 79  Resp: 20 16 15 18   Temp: 98.1 F (36.7 C) 97.8 F (36.6 C) 98.1 F (36.7 C) 98 F (36.7 C)  TempSrc: Oral Oral Oral Oral  SpO2: 96% 97% 97% 96%  Weight:   (!) 185.3 kg   Height:        Intake/Output Summary (Last 24 hours) at 10/28/2023 0911 Last data filed at 10/28/2023 0038 Gross per 24 hour  Intake 725.9 ml  Output 1000 ml  Net -274.1 ml       10/28/2023    4:26 AM 10/27/2023    4:12 AM 10/26/2023    5:33 AM  Last 3 Weights  Weight (lbs) 408 lb 8 oz 406 lb 8.5 oz 410 lb 4.4 oz  Weight (kg) 185.294 kg 184.4 kg 186.1 kg    Body mass index is 58.61 kg/m.  General: Well nourished, well developed, in no acute distress Head: Atraumatic, normal size  Eyes: PEERLA, EOMI  Neck: Supple, no JVD Endocrine: No thryomegaly Cardiac: Normal S1, S2; irregular rhythm, no murmurs Lungs: Clear to auscultation bilaterally, no wheezing, rhonchi or rales  Abd: Soft, nontender, no hepatomegaly  Ext: No edema, pulses 2+ Musculoskeletal: No deformities, BUE and BLE strength normal and equal Skin: Warm and dry, no rashes   Neuro: Alert and oriented to person, place, time, and situation, CNII-XII grossly intact, no focal deficits  Psych: Normal mood and affect   Cardiac Studies  TTE 08/07/2023  1. Left ventricular ejection fraction, by estimation, is 60 to 65%. The  left ventricle has normal function. The left ventricle has no regional   wall motion abnormalities. There is mild asymmetric left ventricular  hypertrophy of the septal segment. Left  ventricular diastolic parameters were normal.   2. Right ventricular systolic function is normal. The right ventricular  size is severely enlarged. There is normal pulmonary artery systolic  pressure.   3. Right atrial size was mildly dilated.   4. The mitral valve is normal in structure. No evidence of mitral valve  regurgitation. No evidence of mitral stenosis.   5. The aortic valve is tricuspid. Aortic valve regurgitation is not  visualized. No aortic stenosis is present.   Patient Profile  Cody Hampton is a 63 y.o. male with morbid obesity (BMI 58), OSA, atrial flutter on Eliquis , hypertension, hyperlipidemia who was admitted on 10/22/2023 for acute GI bleed and atrial flutter.  Assessment & Plan   # Atrial flutter with RVR - Admitted with GI bleed.  EGD without source of bleeding.  Eliquis  restarted.  Hemoglobin is stable. - Rates have been very difficult to control.  Currently on diltiazem  240 mg twice daily.  Also on digoxin  0.125 mg daily.  Intolerant of metoprolol . - I do not believe his rates are going to get any better.  Flutter is very difficult to rate control.  Given stability in hemoglobin I recommended TEE/cardioversion.  We will tentatively plan for this  tomorrow as long as his blood counts stay stable.  He has been cleared to go back on Eliquis  and then stable for 2 days.  Informed Consent   Shared Decision Making/Informed Consent   The risks [stroke, cardiac arrhythmias rarely resulting in the need for a temporary or permanent pacemaker, skin irritation or burns, esophageal damage, perforation (1:10,000 risk), bleeding, pharyngeal hematoma as well as other potential complications associated with conscious sedation including aspiration, arrhythmia, respiratory failure and death], benefits (treatment guidance, restoration of normal sinus rhythm, diagnostic  support) and alternatives of a transesophageal echocardiogram guided cardioversion were discussed in detail with Cody Hampton and he is willing to proceed.     # Acute GI bleed - EGD negative.  Restarted Eliquis .       For questions or updates, please contact [facility] Please consult [email or web address] for contact info under        Signed, Melodee Spruce T. Rolm Clos, MD, [facility]  [facility]  10/28/2023 9:11 AM

## 2023-10-28 NOTE — Progress Notes (Signed)
 PROGRESS NOTE    Cody Hampton  VHQ:[id number] DOB: [birth year] DOA: 10/22/2023 PCP: Lazoff, Shawn P, DO     Brief Narrative:  Cody Hampton is a 63 y.o. male with medical history significant of atrial flutter on eliquis , HTN, HLD, GERD, MGUS, OSA, prediabetes, depression and anxiety, class 3 obesity presenting to the ED with black stools and weakness.  Found to have low hemoglobin.  Recently admitted in March 2025 for GI bleed and underwent EGD which did not show any obvious source of bleeding.  Patient hospitalized again for GI work up.  He underwent video capsule endoscopy which did not show any evidence of GI bleeding.  He was restarted on Eliquis  4/18.    Subjective:  The patient was seen and examined this morning, no acute distress, denies any further chest pain, but complaining of palpitation Hemoglobin remained stable at 9.1, satting 93% on room air   He has discussed with cardiology and agreed to proceed with cardioversion in a.m.    Assessment & Plan:   Principal Problem:   Atrial flutter with rapid ventricular response (HCC) Active Problems:   Anxiety and depression   Class 3 severe obesity due to excess calories with serious comorbidity and body mass index (BMI) greater than or equal to 70 in adult [facility])   Essential (primary) hypertension   Gastroesophageal reflux disease   OSA (obstructive sleep apnea)   S/P bariatric surgery   GI bleeding   Chronic anticoagulation   Atrial flutter with RVR -Heart rate palpitation difficult to control on current medications - Cardiology following - Planning for tentative TEE cardioversion in a.m. 10/29/2023  -Cardiology following: Currently on digoxin  0.125, diltiazem  CD240, Eliquis       GI bleed/acute blood loss anemia Hemoglobin was 13.6 earlier this month.  - Monitoring trending CBC, hemoglobin remained stable at 9.6, 8.9, 9.1 this a.m.    Presented with hemoglobin of 8.8.   Gastroenterology consulted. Patient  was transfused 1 unit of PRBC 10/22/23 Remains on PPI  Per Dr. Nickey Barn: VCE without evidence of any bleeding. 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 May resume Eliquis . If bleeding recurs, he needs a CTA. His primary GI is with [facility] and he will need outpatient follow up.    Hypotension Resolved    Hypocalcemia Received calcium  gluconate in the ED   Leukocytosis -likely reactive, no signs of infection monitor    History of anxiety and depression Continue Celexa .   Class III obesity Estimated body mass index is 58.61 kg/m as calculated from the following:   Height as of this encounter: 5\' 10"  (1.778 m).   Weight as of this encounter: 185.3 kg.   DVT prophylaxis:  Place and maintain sequential compression device Start: 10/25/23 2310 SCDs Start: 10/22/23 1605 apixaban  (ELIQUIS ) tablet 5 mg  Code Status: Full Family Communication: None at bedside Disposition Plan:  Home  Status is: Inpatient Remains inpatient appropriate because: Awaiting improvement in HR and monitor stool starting back on Eliquis     Antimicrobials:  Anti-infectives (From admission, onward)    None        Objective: Vitals:   10/28/23 0426 10/28/23 0750 10/28/23 1035 10/28/23 1057  BP: 125/72 (!) 126/54 120/81   Pulse: 95 79 (!) 108 (!) 105  Resp: 15 18 12    Temp: 98.1 F (36.7 C) 98 F (36.7 C)    TempSrc: Oral Oral    SpO2: 97% 96% 93%  Weight: (!) 185.3 kg     Height:        Intake/Output Summary (Last 24 hours) at 10/28/2023 1140 Last data filed at 10/28/2023 1058 Gross per 24 hour  Intake 427 ml  Output 1000 ml  Net -573 ml   Filed Weights   10/26/23 0533 10/27/23 0412 10/28/23 0426  Weight: (!) 186.1 kg (!) 184.4 kg (!) 185.3 kg        General:  AAO x 3,  cooperative, no distress;   HEENT:  Normocephalic, PERRL, otherwise with in Normal limits   Neuro:  CNII-XII intact. , normal motor and sensation,  reflexes intact   Lungs:   Clear to auscultation BL, Respirations unlabored,  No wheezes / crackles  Cardio:    S1/S2, RRR, No murmure, No Rubs or Gallops   Abdomen:  Soft, non-tender, bowel sounds active all four quadrants, no guarding or peritoneal signs.  Muscular  skeletal:  Limited exam -global generalized weaknesses - in bed, able to move all 4 extremities,   2+ pulses,  symmetric, No pitting edema  Skin:  Dry, warm to touch, negative for any Rashes,  Wounds: Please see nursing documentation         Data Reviewed: I have personally reviewed following labs and imaging studies  CBC: Recent Labs  Lab 10/22/23 1307 10/22/23 1351 10/24/23 0258 10/25/23 0316 10/26/23 0243 10/27/23 0321 10/28/23 0351  WBC 17.2*   < > 16.7* 17.0* 14.3* 14.2* 14.0*  NEUTROABS 13.7*  --   --   --   --   --   --   HGB 8.8*   < > 8.9* 9.1* 9.6* 8.9* 9.1*  HCT 28.7*   < > 27.1* 29.3* 30.9* 28.1* 29.1*  MCV 92.9   < > 89.7 90.7 92.8 90.9 90.4  PLT 419*   < > 383 456* 419* 472* 495*   < > = values in this interval not displayed.   Basic Metabolic Panel: Recent Labs  Lab 10/22/23 1307 10/22/23 1351 10/23/23 0321 10/24/23 0258 10/25/23 0316 10/26/23 0243 10/27/23 0321  NA 135   < > 136 135 136 138 135  K 4.3   < > 4.0 3.9 3.9 3.8 3.5  CL 106   < > 108 105 104 105 102  CO2 20*  --  21* 22 22 22 23   GLUCOSE 174*   < > 131* 144* 138* 117* 112*  BUN 22   < > 17 10 10 12 14   CREATININE 0.88   < > 0.76 0.74 0.81 0.73 0.67  CALCIUM  8.1*  --  8.0* 8.2* 8.2* 8.2* 8.0*  MG 1.8  --   --  2.0  --   --   --    < > = values in this interval not displayed.   GFR: Estimated Creatinine Clearance: 155.6 mL/min (by C-G formula based on SCr of 0.67 mg/dL). Liver Function Tests: Recent Labs  Lab 10/22/23 1307  AST 25  ALT 16  ALKPHOS 60  BILITOT 0.6  PROT 5.8*  ALBUMIN 3.0*   No results for input(s): "LIPASE", "AMYLASE" in the last 168 hours. No results for input(s): "AMMONIA" in the last 168  hours. Coagulation Profile: Recent Labs  Lab 10/22/23 1307  INR 1.3*    CBG: Recent Labs  Lab 10/23/23 0759 10/25/23 0924 10/26/23 1053 10/27/23 0643 10/28/23 0559  GLUCAP 133* 200* 110* 128* 117*       Radiology Studies: No results found.  Scheduled Meds:  apixaban   5 mg Oral BID   citalopram   20 mg Oral BID   digoxin   0.125 mg Oral Daily   diltiazem   240 mg Oral BID   pantoprazole   40 mg Oral Daily   polyethylene glycol  17 g Oral Daily   sodium chloride  flush  3-10 mL Intravenous Q12H   Continuous Infusions:   LOS: 6 days   Time spent: 35 minutes   Bobbetta Burnet, MD [facility] 10/28/2023, 11:40 AM   Available via Epic secure chat 7am-7pm After these hours, please refer to coverage provider listed on [email or web address]

## 2023-10-29 ENCOUNTER — Inpatient Hospital Stay (HOSPITAL_COMMUNITY): Admitting: Anesthesiology

## 2023-10-29 ENCOUNTER — Encounter (HOSPITAL_COMMUNITY): Payer: Self-pay | Admitting: Internal Medicine

## 2023-10-29 ENCOUNTER — Encounter (HOSPITAL_COMMUNITY)
Admission: EM | Disposition: A | Discharge status: Home / Self Care | Payer: Self-pay | Source: Home / Self Care | Attending: Internal Medicine

## 2023-10-29 ENCOUNTER — Inpatient Hospital Stay (HOSPITAL_COMMUNITY)

## 2023-10-29 DIAGNOSIS — I10 Essential (primary) hypertension: Secondary | ICD-10-CM | POA: Diagnosis not present

## 2023-10-29 DIAGNOSIS — R9431 Abnormal electrocardiogram [ECG] [EKG]: Secondary | ICD-10-CM

## 2023-10-29 DIAGNOSIS — F419 Anxiety disorder, unspecified: Secondary | ICD-10-CM | POA: Diagnosis not present

## 2023-10-29 DIAGNOSIS — I483 Typical atrial flutter: Secondary | ICD-10-CM | POA: Diagnosis not present

## 2023-10-29 DIAGNOSIS — F32A Depression, unspecified: Secondary | ICD-10-CM | POA: Diagnosis not present

## 2023-10-29 DIAGNOSIS — I4892 Unspecified atrial flutter: Secondary | ICD-10-CM

## 2023-10-29 DIAGNOSIS — K922 Gastrointestinal hemorrhage, unspecified: Secondary | ICD-10-CM | POA: Diagnosis not present

## 2023-10-29 DIAGNOSIS — G4733 Obstructive sleep apnea (adult) (pediatric): Secondary | ICD-10-CM

## 2023-10-29 HISTORY — PX: CARDIOVERSION: EP1203

## 2023-10-29 HISTORY — PX: TRANSESOPHAGEAL ECHOCARDIOGRAM (CATH LAB): EP1270

## 2023-10-29 LAB — CBC
HCT: 27.9 % — ABNORMAL LOW (ref 39.0–52.0)
Hemoglobin: 8.6 g/dL — ABNORMAL LOW (ref 13.0–17.0)
MCH: 27.8 pg (ref 26.0–34.0)
MCHC: 30.8 g/dL (ref 30.0–36.0)
MCV: 90.3 fL (ref 80.0–100.0)
Platelets: 503 10*3/uL — ABNORMAL HIGH (ref 150–400)
RBC: 3.09 MIL/uL — ABNORMAL LOW (ref 4.22–5.81)
RDW: 14.7 % (ref 11.5–15.5)
WBC: 11.7 10*3/uL — ABNORMAL HIGH (ref 4.0–10.5)
nRBC: 0 % (ref 0.0–0.2)

## 2023-10-29 LAB — BASIC METABOLIC PANEL WITH GFR
Anion gap: 10 (ref 5–15)
BUN: 10 mg/dL (ref 8–23)
CO2: 23 mmol/L (ref 22–32)
Calcium: 8.1 mg/dL — ABNORMAL LOW (ref 8.9–10.3)
Chloride: 105 mmol/L (ref 98–111)
Creatinine, Ser: 0.65 mg/dL (ref 0.61–1.24)
GFR, Estimated: >60 mL/min (ref >60)
Glucose, Bld: 126 mg/dL — ABNORMAL HIGH (ref 70–99)
Potassium: 4.5 mmol/L (ref 3.5–5.1)
Sodium: 138 mmol/L (ref 135–145)

## 2023-10-29 LAB — GLUCOSE, CAPILLARY: Glucose-Capillary: 132 mg/dL — ABNORMAL HIGH (ref 70–99)

## 2023-10-29 LAB — ECHO TEE

## 2023-10-29 SURGERY — TRANSESOPHAGEAL ECHOCARDIOGRAM (TEE) (CATHLAB)
Anesthesia: General

## 2023-10-29 MED ORDER — PHENYLEPHRINE 80 MCG/ML (10ML) SYRINGE FOR IV PUSH (FOR BLOOD PRESSURE SUPPORT)
PREFILLED_SYRINGE | INTRAVENOUS | Status: DC | PRN
Start: 2023-10-29 — End: 2023-10-29

## 2023-10-29 MED ORDER — ONDANSETRON HCL 4 MG/2ML IJ SOLN
INTRAMUSCULAR | Status: DC | PRN
  Administered 2023-10-29: 4 mg via INTRAVENOUS

## 2023-10-29 MED ORDER — PROPOFOL 10 MG/ML IV BOLUS
INTRAVENOUS | Status: DC | PRN
Start: 2023-10-29 — End: 2023-10-29
  Administered 2023-10-29: 200 mg via INTRAVENOUS

## 2023-10-29 MED ORDER — PHENYLEPHRINE HCL-NACL 20-0.9 MG/250ML-% IV SOLN
INTRAVENOUS | Status: DC | PRN
Start: 2023-10-29 — End: 2023-10-29
  Administered 2023-10-29: 160 ug via INTRAVENOUS
  Administered 2023-10-29: 80 ug via INTRAVENOUS

## 2023-10-29 MED ORDER — ESMOLOL HCL 100 MG/10ML IV SOLN
INTRAVENOUS | Status: DC | PRN
  Administered 2023-10-29: 30 mg via INTRAVENOUS

## 2023-10-29 MED ORDER — ROCURONIUM BROMIDE 10 MG/ML (PF) SYRINGE
PREFILLED_SYRINGE | INTRAVENOUS | Status: DC | PRN
  Administered 2023-10-29: 10 mg via INTRAVENOUS
  Administered 2023-10-29: 25 mg via INTRAVENOUS

## 2023-10-29 MED ORDER — LIDOCAINE 2% (20 MG/ML) 5 ML SYRINGE
INTRAMUSCULAR | Status: DC | PRN
  Administered 2023-10-29: 100 mg via INTRAVENOUS

## 2023-10-29 MED ORDER — SUGAMMADEX SODIUM 200 MG/2ML IV SOLN
INTRAVENOUS | Status: DC | PRN
  Administered 2023-10-29 (×2): 200 mg via INTRAVENOUS

## 2023-10-29 MED ORDER — SUCCINYLCHOLINE CHLORIDE 200 MG/10ML IV SOSY
PREFILLED_SYRINGE | INTRAVENOUS | Status: DC | PRN
  Administered 2023-10-29: 200 mg via INTRAVENOUS

## 2023-10-29 SURGICAL SUPPLY — 1 items: PAD DEFIB RADIO PHYSIO CONN (PAD) ×1 IMPLANT

## 2023-10-29 NOTE — Progress Notes (Signed)
 Cardiology Progress Note  Patient ID: Carder Yin MRN: [id number] DOB: [birth year] Date of Encounter: 10/29/2023 Primary Cardiologist: Euell Herrlich, MD  Subjective   No complaints. Remains in atrial flutter. Hemoglobin stable at 9.1 today. On Eliquis . Plan for TEE/DCCV today.  Telemetry   Atrial flutter with CVR - personally reviewed  Physical Exam   Vitals:   10/28/23 2334 10/29/23 0549 10/29/23 0734 10/29/23 0750  BP: (!) 125/59 135/68  (!) 113/55  Pulse: 76 (!) 108 87   Resp: 19 15 19 18   Temp: 98.5 F (36.9 C) 98.2 F (36.8 C) 98.2 F (36.8 C) 98.3 F (36.8 C)  TempSrc: Oral Oral Oral Oral  SpO2: 95% 97%    Weight:  (!) 185.4 kg    Height:        Intake/Output Summary (Last 24 hours) at 10/29/2023 0836 Last data filed at 10/29/2023 0559 Gross per 24 hour  Intake 364 ml  Output 450 ml  Net -86 ml       10/29/2023    5:49 AM 10/28/2023    4:26 AM 10/27/2023    4:12 AM  Last 3 Weights  Weight (lbs) 408 lb 11.2 oz 408 lb 8 oz 406 lb 8.5 oz  Weight (kg) 185.385 kg 185.294 kg 184.4 kg    Body mass index is 58.64 kg/m.   General appearance: alert, no distress, morbidly obese, and pale Lungs: clear to auscultation bilaterally Heart: irregularly irregular rhythm Abdomen: soft, non-tender; bowel sounds normal; no masses,  no organomegaly Extremities: extremities normal, atraumatic, no cyanosis or edema Neurologic: Grossly normal   Cardiac Studies   TTE 08/07/2023  1. Left ventricular ejection fraction, by estimation, is 60 to 65%. The  left ventricle has normal function. The left ventricle has no regional  wall motion abnormalities. There is mild asymmetric left ventricular  hypertrophy of the septal segment. Left  ventricular diastolic parameters were normal.   2. Right ventricular systolic function is normal. The right ventricular  size is severely enlarged. There is normal pulmonary artery systolic  pressure.   3. Right atrial size was mildly  dilated.   4. The mitral valve is normal in structure. No evidence of mitral valve  regurgitation. No evidence of mitral stenosis.   5. The aortic valve is tricuspid. Aortic valve regurgitation is not  visualized. No aortic stenosis is present.   Patient Profile   Cody Hampton is a 63 y.o. male with morbid obesity (BMI 58), OSA, atrial flutter on Eliquis , hypertension, hyperlipidemia who was admitted on 10/22/2023 for acute GI bleed and atrial flutter.  Assessment & Plan   # Atrial flutter with RVR - Admitted with GI bleed.  EGD without source of bleeding, however, he was on long-term naproxen.  Eliquis  restarted.  Hemoglobin is stable. - Rates have been very difficult to control.  Currently on diltiazem  240 mg twice daily.  Also on digoxin  0.125 mg daily.  Intolerant of metoprolol . - I do not believe his rates are going to get any better.  Flutter is very difficult to rate control.  Given stability in hemoglobin I recommended TEE/cardioversion which is scheduled for today.  He has been cleared to go back on Eliquis  and then stable for 2 days.  Informed Consent   Shared Decision Making/Informed Consent   The risks [stroke, cardiac arrhythmias rarely resulting in the need for a temporary or permanent pacemaker, skin irritation or burns, esophageal damage, perforation (1:10,000 risk), bleeding, pharyngeal hematoma as well as other potential  complications associated with conscious sedation including aspiration, arrhythmia, respiratory failure and death], benefits (treatment guidance, restoration of normal sinus rhythm, diagnostic support) and alternatives of a transesophageal echocardiogram guided cardioversion were discussed in detail with Mr. Gallo and he is willing to proceed.     # Acute GI bleed - EGD negative.  Restarted Eliquis .    For questions or updates, please contact [facility] [place] Please consult [email or web address] for contact info under   Hazle Lites, MD, [facility],  FNLA, FACP  [place]  [facility]  Medical Director of the [facility]  Cardiovascular Risk Reduction Clinic Diplomate of the American Board of Clinical Lipidology Attending Cardiologist  Direct Dial: [phone]  Fax: [phone]  Website:  [email or web address]  10/29/2023 8:36 AM

## 2023-10-29 NOTE — Progress Notes (Signed)
 [facility] PROGRESS NOTE   Cody Hampton EAV:[id number] DOB: 17-Mar-1961 DOA: 10/22/2023  PCP: Lazoff, Shawn P, DO  Brief History: 63 y.o. male with medical history significant of atrial flutter on eliquis , HTN, HLD, GERD, MGUS, OSA, prediabetes, depression and anxiety, class 3 obesity presenting to the ED with black stools and weakness.  Found to have low hemoglobin.  Recently admitted in March 2025 for GI bleed and underwent EGD which did not show any obvious source of bleeding.  Patient hospitalized again for GI work up.  He underwent video capsule endoscopy which did not show any evidence of GI bleeding.  He was restarted on Eliquis  4/18.    Consultants: Gastroenterology.  Cardiology  Procedures: Video capsule endoscopy    Subjective/Interval History: Patient denies any chest pain or shortness of breath this morning.  Aware that he will be undergoing DC cardioversion today.    Assessment/Plan:  Atrial flutter with RVR Cardiology following.  Heart rate has been difficult to control. Patient noted to be on digoxin , diltiazem . Plan is for DC cardioversion today. Patient was started back on Eliquis  and seems to be tolerating it well without any overt bleeding.    GI bleed/acute blood loss anemia Hemoglobin was 13.6 earlier this month.  Dropped down to 2 between 8 and 9. Gastroenterology was consulted. Patient underwent video capsule endoscopy which did not show any evidence for bleeding.  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 Eliquis  was resumed.  No overt bleeding in the last 48 hours. His primary gastroenterology is with [facility].  He will need to follow-up with them in the next few weeks. Patient with history of NSAID use.  He has been told not to continue with naproxen. Continue PPI.   Hypotension Resolved    Hypocalcemia Received calcium  gluconate in the ED   Leukocytosis -likely reactive, no signs of infection monitor    History of  anxiety and depression Continue Celexa .   Class III obesity Estimated body mass index is 58.61 kg/m as calculated from the following:   Height as of this encounter: 5\' 10"  (1.778 m).   Weight as of this encounter: 185.3 kg.   DVT Prophylaxis: On apixaban  Code Status: Full code Family Communication: Discussed with patient Disposition Plan: Home with home health when improved     Medications: Scheduled:  apixaban   5 mg Oral BID   citalopram   20 mg Oral BID   digoxin   0.125 mg Oral Daily   diltiazem   240 mg Oral BID   pantoprazole   40 mg Oral Daily   polyethylene glycol  17 g Oral Daily   sodium chloride  flush  3-10 mL Intravenous Q12H   Continuous:  sodium chloride  20 mL/hr at 10/28/23 2040   PRN:acetaminophen  **OR** acetaminophen , diclofenac  Sodium, docusate sodium , ondansetron  **OR** ondansetron  (ZOFRAN ) IV, oxyCODONE -acetaminophen , sodium chloride  flush  Antibiotics: Anti-infectives (From admission, onward)    None       Objective:  Vital Signs  Vitals:   10/28/23 2334 10/29/23 0549 10/29/23 0734 10/29/23 0750  BP: (!) 125/59 135/68  (!) 113/55  Pulse: 76 (!) 108 87   Resp: 19 15 19 18   Temp: 98.5 F (36.9 C) 98.2 F (36.8 C) 98.2 F (36.8 C) 98.3 F (36.8 C)  TempSrc: Oral Oral Oral Oral  SpO2: 95% 97%    Weight:  (!) 185.4 kg    Height:        Intake/Output Summary (Last 24 hours) at 10/29/2023 7829 Last data filed at  10/29/2023 0559 Gross per 24 hour  Intake 187 ml  Output 450 ml  Net -263 ml   Filed Weights   10/27/23 0412 10/28/23 0426 10/29/23 0549  Weight: (!) 184.4 kg (!) 185.3 kg (!) 185.4 kg    General appearance: Awake alert.  In no distress Resp: Clear to auscultation bilaterally.  Normal effort Cardio: S1-S2 is irregular irregular GI: Abdomen is soft.  Nontender nondistended.  Bowel sounds are present normal.  No masses organomegaly Extremities: No edema.  Full range of motion of lower extremities. Neurologic: Alert and oriented  x3.  No focal neurological deficits.    Lab Results:  Data Reviewed: I have personally reviewed following labs and reports of the imaging studies  CBC: Recent Labs  Lab 10/22/23 1307 10/22/23 1351 10/25/23 0316 10/26/23 0243 10/27/23 0321 10/28/23 0351 10/29/23 0814  WBC 17.2*   < > 17.0* 14.3* 14.2* 14.0* 11.7*  NEUTROABS 13.7*  --   --   --   --   --   --   HGB 8.8*   < > 9.1* 9.6* 8.9* 9.1* 8.6*  HCT 28.7*   < > 29.3* 30.9* 28.1* 29.1* 27.9*  MCV 92.9   < > 90.7 92.8 90.9 90.4 90.3  PLT 419*   < > 456* 419* 472* 495* 503*   < > = values in this interval not displayed.    Basic Metabolic Panel: Recent Labs  Lab 10/22/23 1307 10/22/23 1351 10/23/23 0321 10/24/23 0258 10/25/23 0316 10/26/23 0243 10/27/23 0321  NA 135   < > 136 135 136 138 135  K 4.3   < > 4.0 3.9 3.9 3.8 3.5  CL 106   < > 108 105 104 105 102  CO2 20*  --  21* 22 22 22 23   GLUCOSE 174*   < > 131* 144* 138* 117* 112*  BUN 22   < > 17 10 10 12 14   CREATININE 0.88   < > 0.76 0.74 0.81 0.73 0.67  CALCIUM  8.1*  --  8.0* 8.2* 8.2* 8.2* 8.0*  MG 1.8  --   --  2.0  --   --   --    < > = values in this interval not displayed.    GFR: Estimated Creatinine Clearance: 155.7 mL/min (by C-G formula based on SCr of 0.67 mg/dL).  Liver Function Tests: Recent Labs  Lab 10/22/23 1307  AST 25  ALT 16  ALKPHOS 60  BILITOT 0.6  PROT 5.8*  ALBUMIN 3.0*    Coagulation Profile: Recent Labs  Lab 10/22/23 1307  INR 1.3*    CBG: Recent Labs  Lab 10/25/23 0924 10/26/23 1053 10/27/23 0643 10/28/23 0559 10/29/23 0551  GLUCAP 200* 110* 128* 117* 132*    Radiology Studies: No results found.     LOS: 7 days   Ashyia Schraeder [employer] on [email or web address]  10/29/2023, 9:46 AM

## 2023-10-29 NOTE — Anesthesia Postprocedure Evaluation (Signed)
 Anesthesia Post Note  Patient: Cody Hampton  Procedure(s) Performed: TRANSESOPHAGEAL ECHOCARDIOGRAM CARDIOVERSION     Patient location during evaluation: Cath Lab Anesthesia Type: General Level of consciousness: awake and alert Pain management: pain level controlled Vital Signs Assessment: post-procedure vital signs reviewed and stable Respiratory status: spontaneous breathing, nonlabored ventilation, respiratory function stable and patient connected to nasal cannula oxygen  Cardiovascular status: blood pressure returned to baseline and stable Postop Assessment: no apparent nausea or vomiting Anesthetic complications: no  No notable events documented.  Last Vitals:  Vitals:   10/29/23 1415 10/29/23 1607  BP: (!) 147/72   Pulse: 96 98  Resp: 18   Temp: 36.8 C 36.7 C  SpO2: 94%     Last Pain:  Vitals:   10/29/23 1607  TempSrc: Oral  PainSc:                  Lorren Splawn L Tericka Devincenzi

## 2023-10-29 NOTE — Transfer of Care (Signed)
 Immediate Anesthesia Transfer of Care Note  Patient: Taniela Feltus  Procedure(s) Performed: TRANSESOPHAGEAL ECHOCARDIOGRAM CARDIOVERSION  Patient Location: PACU and Cath Lab  Anesthesia Type:General  Level of Consciousness: awake, alert , and oriented  Airway & Oxygen  Therapy: Patient Spontanous Breathing and Patient connected to face mask oxygen   Post-op Assessment: Report given to RN and Post -op Vital signs reviewed and stable  Post vital signs: Reviewed and stable  Last Vitals:  Vitals Value Taken Time  BP 106/84 10/29/23 1325  Temp    Pulse 90 10/29/23 1330  Resp 16 10/29/23 1330  SpO2 93 % 10/29/23 1330  Vitals shown include unfiled device data.  Last Pain:  Vitals:   10/29/23 1017  TempSrc:   PainSc: 0-No pain         Complications: No notable events documented.

## 2023-10-29 NOTE — Progress Notes (Signed)
  Echocardiogram Echocardiogram Transesophageal has been performed.  Cody Hampton 10/29/2023, 1:18 PM

## 2023-10-29 NOTE — Progress Notes (Signed)
 Pt brought to cath lab holding, [address], connected to monitor, arousable to voice, appropriate, male purewick remains in place, connected to wall suction, safety maintained

## 2023-10-29 NOTE — Anesthesia Preprocedure Evaluation (Addendum)
 Anesthesia Evaluation  Patient identified by MRN, date of birth, ID band Patient awake    Reviewed: Allergy & Precautions, NPO status , Patient's Chart, lab work & pertinent test results  Airway Mallampati: II  TM Distance: >3 FB Neck ROM: Full    Dental  (+) Missing, Chipped, Poor Dentition, Dental Advisory Given   Pulmonary sleep apnea and Continuous Positive Airway Pressure Ventilation    Pulmonary exam normal breath sounds clear to auscultation       Cardiovascular hypertension, Normal cardiovascular exam+ dysrhythmias Atrial Fibrillation  Rhythm:Regular Rate:Normal  TTE 2025 1. Left ventricular ejection fraction, by estimation, is 60 to 65%. The  left ventricle has normal function. The left ventricle has no regional  wall motion abnormalities. There is mild asymmetric left ventricular  hypertrophy of the septal segment. Left  ventricular diastolic parameters were normal.   2. Right ventricular systolic function is normal. The right ventricular  size is severely enlarged. There is normal pulmonary artery systolic  pressure.   3. Right atrial size was mildly dilated.   4. The mitral valve is normal in structure. No evidence of mitral valve  regurgitation. No evidence of mitral stenosis.   5. The aortic valve is tricuspid. Aortic valve regurgitation is not  visualized. No aortic stenosis is present.     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)  Renal/GU negative Renal ROS  negative genitourinary   Musculoskeletal negative musculoskeletal ROS (+)    Abdominal   Peds  Hematology  (+) Blood dyscrasia, anemia Lab Results      Component                Value               Date                      WBC                      11.7 (H)            10/29/2023                HGB                      8.6  (L)             10/29/2023                HCT                      27.9 (L)            10/29/2023                MCV                      90.3                10/29/2023                PLT                      503 (H)             10/29/2023  Anesthesia Other Findings   Reproductive/Obstetrics                             Anesthesia Physical Anesthesia Plan  ASA: 4  Anesthesia Plan: General   Post-op Pain Management:    Induction: Intravenous  PONV Risk Score and Plan: 2 and Treatment may vary due to age or medical condition and Ondansetron   Airway Management Planned: Oral ETT  Additional Equipment:   Intra-op Plan:   Post-operative Plan: Extubation in OR  Informed Consent: I have reviewed the patients History and Physical, chart, labs and discussed the procedure including the risks, benefits and alternatives for the proposed anesthesia with the patient or authorized representative who has indicated his/her understanding and acceptance.     Dental advisory given  Plan Discussed with: CRNA  Anesthesia Plan Comments:        Anesthesia Quick Evaluation

## 2023-10-29 NOTE — Progress Notes (Signed)
 PT Cancellation Note  Patient Details Name: Blease Capaldi MRN: [id number] DOB: 1960-09-14   Cancelled Treatment:    Reason Eval/Treat Not Completed: Other (comment) Pt off the floor at this time. Will f/u as able.  Emaline Handsome, PT, DPT 10/29/23, 11:59 AM    Venetta Gill 10/29/2023, 11:59 AM

## 2023-10-29 NOTE — H&P (View-Only) (Signed)
 Cardiology Progress Note  Patient ID: Carder Yin MRN: [id number] DOB: [birth year] Date of Encounter: 10/29/2023 Primary Cardiologist: Euell Herrlich, MD  Subjective   No complaints. Remains in atrial flutter. Hemoglobin stable at 9.1 today. On Eliquis . Plan for TEE/DCCV today.  Telemetry   Atrial flutter with CVR - personally reviewed  Physical Exam   Vitals:   10/28/23 2334 10/29/23 0549 10/29/23 0734 10/29/23 0750  BP: (!) 125/59 135/68  (!) 113/55  Pulse: 76 (!) 108 87   Resp: 19 15 19 18   Temp: 98.5 F (36.9 C) 98.2 F (36.8 C) 98.2 F (36.8 C) 98.3 F (36.8 C)  TempSrc: Oral Oral Oral Oral  SpO2: 95% 97%    Weight:  (!) 185.4 kg    Height:        Intake/Output Summary (Last 24 hours) at 10/29/2023 0836 Last data filed at 10/29/2023 0559 Gross per 24 hour  Intake 364 ml  Output 450 ml  Net -86 ml       10/29/2023    5:49 AM 10/28/2023    4:26 AM 10/27/2023    4:12 AM  Last 3 Weights  Weight (lbs) 408 lb 11.2 oz 408 lb 8 oz 406 lb 8.5 oz  Weight (kg) 185.385 kg 185.294 kg 184.4 kg    Body mass index is 58.64 kg/m.   General appearance: alert, no distress, morbidly obese, and pale Lungs: clear to auscultation bilaterally Heart: irregularly irregular rhythm Abdomen: soft, non-tender; bowel sounds normal; no masses,  no organomegaly Extremities: extremities normal, atraumatic, no cyanosis or edema Neurologic: Grossly normal   Cardiac Studies   TTE 08/07/2023  1. Left ventricular ejection fraction, by estimation, is 60 to 65%. The  left ventricle has normal function. The left ventricle has no regional  wall motion abnormalities. There is mild asymmetric left ventricular  hypertrophy of the septal segment. Left  ventricular diastolic parameters were normal.   2. Right ventricular systolic function is normal. The right ventricular  size is severely enlarged. There is normal pulmonary artery systolic  pressure.   3. Right atrial size was mildly  dilated.   4. The mitral valve is normal in structure. No evidence of mitral valve  regurgitation. No evidence of mitral stenosis.   5. The aortic valve is tricuspid. Aortic valve regurgitation is not  visualized. No aortic stenosis is present.   Patient Profile   Tiger Spieker is a 63 y.o. male with morbid obesity (BMI 58), OSA, atrial flutter on Eliquis , hypertension, hyperlipidemia who was admitted on 10/22/2023 for acute GI bleed and atrial flutter.  Assessment & Plan   # Atrial flutter with RVR - Admitted with GI bleed.  EGD without source of bleeding, however, he was on long-term naproxen.  Eliquis  restarted.  Hemoglobin is stable. - Rates have been very difficult to control.  Currently on diltiazem  240 mg twice daily.  Also on digoxin  0.125 mg daily.  Intolerant of metoprolol . - I do not believe his rates are going to get any better.  Flutter is very difficult to rate control.  Given stability in hemoglobin I recommended TEE/cardioversion which is scheduled for today.  He has been cleared to go back on Eliquis  and then stable for 2 days.  Informed Consent   Shared Decision Making/Informed Consent   The risks [stroke, cardiac arrhythmias rarely resulting in the need for a temporary or permanent pacemaker, skin irritation or burns, esophageal damage, perforation (1:10,000 risk), bleeding, pharyngeal hematoma as well as other potential  complications associated with conscious sedation including aspiration, arrhythmia, respiratory failure and death], benefits (treatment guidance, restoration of normal sinus rhythm, diagnostic support) and alternatives of a transesophageal echocardiogram guided cardioversion were discussed in detail with Mr. Urbani and he is willing to proceed.     # Acute GI bleed - EGD negative.  Restarted Eliquis .    For questions or updates, please contact [facility] Please consult [email or web address] for contact info under   Hazle Lites, MD, [facility],  FNLA, FACP    [facility]  Medical Director of the [facility]  [facility] Diplomate of the American Board of Clinical Lipidology Attending Cardiologist  Direct Dial: [phone]  Fax: [phone]  Website:  [email or web address]  10/29/2023 8:36 AM

## 2023-10-29 NOTE — CV Procedure (Signed)
 INDICATIONS: Atrial flutter  PROCEDURE:   Informed consent was obtained prior to the procedure. The risks, benefits and alternatives for the procedure were discussed and the patient comprehended these risks.  Risks include, but are not limited to, cough, sore throat, vomiting, nausea, somnolence, esophageal and stomach trauma or perforation, bleeding, low blood pressure, aspiration, pneumonia, infection, trauma to the teeth and death.    After a procedural time-out, the oropharynx was anesthetized with 20% benzocaine spray.   During this procedure the patient was administered propofol  per anesthesia.  The patient's heart rate, blood pressure, and oxygen  saturation were monitored continuously during the procedure. The period of conscious sedation was 15 minutes, of which I was present face-to-face 100% of this time.  The transesophageal probe was inserted in the esophagus without difficulty and multiple views were obtained.  The patient was kept under observation until the patient left the procedure room.  The patient left the procedure room in stable condition. Transgastric imaging deferred in setting of GI bleed without identified source.  Agitated microbubble saline contrast was not administered.  COMPLICATIONS:    There were no immediate complications.  FINDINGS:   FORMAL ECHOCARDIOGRAM REPORT PENDING No LAA thrombus No LV apical thrombus  RECOMMENDATIONS:     Proceed to DCCV  Procedure: Electrical Cardioversion Indications:  Atrial Flutter  Procedure Details:  Consent: Risks of procedure as well as the alternatives and risks of each were explained to the (patient/caregiver).  Consent for procedure obtained.  Time Out: Verified patient identification, verified procedure, site/side was marked, verified correct patient position, special equipment/implants available, medications/allergies/relevent history reviewed, required imaging and test results available. PERFORMED.  Patient  placed on cardiac monitor, pulse oximetry, supplemental oxygen  as necessary.  Sedation given:  propofol  per anesthesia Pacer pads placed anterior chest.  Cardioverted 1 time(s).  Cardioversion with synchronized biphasic 200J shock.  Evaluation: Findings: Post procedure EKG shows: NSR Complications: None Patient did tolerate procedure well.  Time Spent Directly with the Patient:  30 minutes   Glessie Eustice A Kyzer Blowe 10/29/2023, 1:12 PM

## 2023-10-29 NOTE — Interval H&P Note (Signed)
 History and Physical Interval Note:  10/29/2023 10:05 AM  Cody Hampton  has presented today for surgery, with the diagnosis of aflutter.  The various methods of treatment have been discussed with the patient and family. After consideration of risks, benefits and other options for treatment, the patient has consented to  Procedure(s): TRANSESOPHAGEAL ECHOCARDIOGRAM (N/A) CARDIOVERSION (N/A) as a surgical intervention.  The patient's history has been reviewed, patient examined, no change in status, stable for surgery.  I have reviewed the patient's chart and labs.  Questions were answered to the patient's satisfaction.     Cody Hampton A Harlow Basley

## 2023-10-30 ENCOUNTER — Ambulatory Visit: Payer: Self-pay | Admitting: Hematology

## 2023-10-30 ENCOUNTER — Other Ambulatory Visit: Payer: Self-pay

## 2023-10-30 ENCOUNTER — Other Ambulatory Visit (HOSPITAL_COMMUNITY): Payer: Self-pay

## 2023-10-30 DIAGNOSIS — I4892 Unspecified atrial flutter: Secondary | ICD-10-CM | POA: Diagnosis not present

## 2023-10-30 LAB — BASIC METABOLIC PANEL WITH GFR
Anion gap: 6 (ref 5–15)
BUN: 13 mg/dL (ref 8–23)
CO2: 25 mmol/L (ref 22–32)
Calcium: 7.9 mg/dL — ABNORMAL LOW (ref 8.9–10.3)
Chloride: 105 mmol/L (ref 98–111)
Creatinine, Ser: 0.74 mg/dL (ref 0.61–1.24)
GFR, Estimated: >60 mL/min (ref >60)
Glucose, Bld: 117 mg/dL — ABNORMAL HIGH (ref 70–99)
Potassium: 3.8 mmol/L (ref 3.5–5.1)
Sodium: 136 mmol/L (ref 135–145)

## 2023-10-30 LAB — CBC
HCT: 27.2 % — ABNORMAL LOW (ref 39.0–52.0)
Hemoglobin: 8.4 g/dL — ABNORMAL LOW (ref 13.0–17.0)
MCH: 27.9 pg (ref 26.0–34.0)
MCHC: 30.9 g/dL (ref 30.0–36.0)
MCV: 90.4 fL (ref 80.0–100.0)
Platelets: 509 10*3/uL — ABNORMAL HIGH (ref 150–400)
RBC: 3.01 MIL/uL — ABNORMAL LOW (ref 4.22–5.81)
RDW: 14.8 % (ref 11.5–15.5)
WBC: 12.2 10*3/uL — ABNORMAL HIGH (ref 4.0–10.5)
nRBC: 0 % (ref 0.0–0.2)

## 2023-10-30 LAB — GLUCOSE, CAPILLARY: Glucose-Capillary: 111 mg/dL — ABNORMAL HIGH (ref 70–99)

## 2023-10-30 LAB — DIGOXIN LEVEL: Digoxin Level: 0.5 ng/mL — ABNORMAL LOW (ref 0.8–2.0)

## 2023-10-30 MED ORDER — DIGOXIN 250 MCG PO TABS
0.2500 mg | ORAL_TABLET | Freq: Every day | ORAL | 2 refills | Status: DC
  Filled 2023-10-30: qty 30, 30d supply, fill #0

## 2023-10-30 MED ORDER — DILTIAZEM HCL ER COATED BEADS 240 MG PO CP24
240.0000 mg | ORAL_CAPSULE | Freq: Two times a day (BID) | ORAL | 2 refills | Status: DC
  Filled 2023-10-30: qty 60, 30d supply, fill #0

## 2023-10-30 MED ORDER — DIGOXIN 125 MCG PO TABS
0.2500 mg | ORAL_TABLET | Freq: Every day | ORAL | Status: DC
  Administered 2023-10-30: 0.25 mg via ORAL
  Filled 2023-10-30: qty 2

## 2023-10-30 MED ORDER — PANTOPRAZOLE SODIUM 40 MG PO TBEC
40.0000 mg | DELAYED_RELEASE_TABLET | Freq: Every day | ORAL | 2 refills | Status: DC
  Filled 2023-10-30 – 2023-11-22 (×2): qty 30, 30d supply, fill #0

## 2023-10-30 MED ORDER — POLYETHYLENE GLYCOL 3350 17 GM/SCOOP PO POWD
17.0000 g | Freq: Every day | ORAL | 0 refills | Status: DC
  Filled 2023-10-30: qty 238, 14d supply, fill #0

## 2023-10-30 NOTE — Plan of Care (Signed)
 Problem: Education: Goal: Knowledge of disease or condition will improve 10/30/2023 1119 by Brenton Cambridge, RN Outcome: Adequate for Discharge 10/30/2023 1119 by Brenton Cambridge, RN Outcome: Progressing Goal: Understanding of medication regimen will improve 10/30/2023 1119 by Brenton Cambridge, RN Outcome: Adequate for Discharge 10/30/2023 1119 by Brenton Cambridge, RN Outcome: Progressing   Problem: Activity: Goal: Ability to tolerate increased activity will improve 10/30/2023 1119 by Brenton Cambridge, RN Outcome: Adequate for Discharge 10/30/2023 1119 by Brenton Cambridge, RN Outcome: Progressing   Problem: Cardiac: Goal: Ability to achieve and maintain adequate cardiopulmonary perfusion will improve 10/30/2023 1119 by Brenton Cambridge, RN Outcome: Adequate for Discharge 10/30/2023 1119 by Brenton Cambridge, RN Outcome: Progressing   Problem: Health Behavior/Discharge Planning: Goal: Ability to safely manage health-related needs after discharge will improve 10/30/2023 1119 by Brenton Cambridge, RN Outcome: Adequate for Discharge 10/30/2023 1119 by Brenton Cambridge, RN Outcome: Progressing   Problem: Education: Goal: Knowledge of General Education information will improve Description: Including pain rating scale, medication(s)/side effects and non-pharmacologic comfort measures 10/30/2023 1119 by Brenton Cambridge, RN Outcome: Adequate for Discharge 10/30/2023 1119 by Brenton Cambridge, RN Outcome: Progressing   Problem: Health Behavior/Discharge Planning: Goal: Ability to manage health-related needs will improve 10/30/2023 1119 by Brenton Cambridge, RN Outcome: Adequate for Discharge 10/30/2023 1119 by Brenton Cambridge, RN Outcome: Progressing   Problem: Clinical Measurements: Goal: Ability to maintain clinical measurements within normal limits will improve 10/30/2023 1119 by Brenton Cambridge, RN Outcome: Adequate for Discharge 10/30/2023 1119 by Brenton Cambridge, RN Outcome:  Progressing Goal: Will remain free from infection 10/30/2023 1119 by Brenton Cambridge, RN Outcome: Adequate for Discharge 10/30/2023 1119 by Brenton Cambridge, RN Outcome: Progressing Goal: Diagnostic test results will improve 10/30/2023 1119 by Brenton Cambridge, RN Outcome: Adequate for Discharge 10/30/2023 1119 by Brenton Cambridge, RN Outcome: Progressing Goal: Respiratory complications will improve 10/30/2023 1119 by Brenton Cambridge, RN Outcome: Adequate for Discharge 10/30/2023 1119 by Brenton Cambridge, RN Outcome: Progressing Goal: Cardiovascular complication will be avoided 10/30/2023 1119 by Brenton Cambridge, RN Outcome: Adequate for Discharge 10/30/2023 1119 by Brenton Cambridge, RN Outcome: Progressing   Problem: Activity: Goal: Risk for activity intolerance will decrease 10/30/2023 1119 by Brenton Cambridge, RN Outcome: Adequate for Discharge 10/30/2023 1119 by Brenton Cambridge, RN Outcome: Progressing   Problem: Nutrition: Goal: Adequate nutrition will be maintained 10/30/2023 1119 by Brenton Cambridge, RN Outcome: Adequate for Discharge 10/30/2023 1119 by Brenton Cambridge, RN Outcome: Progressing   Problem: Coping: Goal: Level of anxiety will decrease 10/30/2023 1119 by Brenton Cambridge, RN Outcome: Adequate for Discharge 10/30/2023 1119 by Brenton Cambridge, RN Outcome: Progressing   Problem: Elimination: Goal: Will not experience complications related to bowel motility 10/30/2023 1119 by Brenton Cambridge, RN Outcome: Adequate for Discharge 10/30/2023 1119 by Brenton Cambridge, RN Outcome: Progressing Goal: Will not experience complications related to urinary retention 10/30/2023 1119 by Brenton Cambridge, RN Outcome: Adequate for Discharge 10/30/2023 1119 by Brenton Cambridge, RN Outcome: Progressing   Problem: Pain Managment: Goal: General experience of comfort will improve and/or be controlled 10/30/2023 1119 by Brenton Cambridge, RN Outcome: Adequate for  Discharge 10/30/2023 1119 by Brenton Cambridge, RN Outcome: Progressing   Problem: Safety: Goal: Ability to remain free from injury will improve 10/30/2023 1119 by Brenton Cambridge, RN Outcome: Adequate for Discharge 10/30/2023 1119 by Brenton Cambridge, RN Outcome: Progressing  Problem: Skin Integrity: Goal: Risk for impaired skin integrity will decrease 10/30/2023 1119 by Brenton Cambridge, RN Outcome: Adequate for Discharge 10/30/2023 1119 by Brenton Cambridge, RN Outcome: Progressing

## 2023-10-30 NOTE — TOC Transition Note (Signed)
 Transition of Care [facility]) - Discharge Note   Patient Details  Name: Cody Hampton MRN: [id number] Date of Birth: [birth year]  [facility] of [facility]) CM/SW Contact:  Jennett Model, RN Phone Number: 10/30/2023, 11:56 AM   Clinical Narrative:    NCM notified Renetta Carter with [facility] that patient is for dc today.         Patient Goals and CMS Choice            Discharge Placement                       Discharge Plan and Services Additional resources added to the After Visit Summary for                                       Social Drivers of Health (SDOH) Interventions SDOH Screenings   Food Insecurity: No Food Insecurity (10/23/2023)  Housing: Low Risk  (10/23/2023)  Transportation Needs: No Transportation Needs (10/23/2023)  Utilities: Not At Risk (10/23/2023)  Financial Resource Strain: Low Risk  (01/10/2022)   [facility] to [facility]09[facility] visits prior to 09/09/2022., [facility]  Physical Activity: Insufficiently Active (01/10/2022)   [facility] prior to 09/09/2022., [facility] visits prior to 09/09/2022., [facility]  Social Connections: Unknown (01/10/2022)   [facility] to 09/09/2022[facility] visits prior to 09/09/2022., [facility]  Stress: Stress Concern Present (01/10/2022)   [facility], [facility] prior to 09/09/2022., [facility] visits prior to 09/09/2022.  Tobacco Use: Low Risk  (10/22/2023)     Readmission Risk Interventions     No data to display

## 2023-10-30 NOTE — Plan of Care (Signed)
  Problem: Education: Goal: Knowledge of disease or condition will improve Outcome: Progressing Goal: Understanding of medication regimen will improve Outcome: Progressing   Problem: Activity: Goal: Ability to tolerate increased activity will improve Outcome: Progressing   Problem: Cardiac: Goal: Ability to achieve and maintain adequate cardiopulmonary perfusion will improve Outcome: Progressing   Problem: Health Behavior/Discharge Planning: Goal: Ability to safely manage health-related needs after discharge will improve Outcome: Progressing   Problem: Education: Goal: Knowledge of General Education information will improve Description: Including pain rating scale, medication(s)/side effects and non-pharmacologic comfort measures Outcome: Progressing   Problem: Health Behavior/Discharge Planning: Goal: Ability to manage health-related needs will improve Outcome: Progressing   Problem: Clinical Measurements: Goal: Ability to maintain clinical measurements within normal limits will improve Outcome: Progressing Goal: Will remain free from infection Outcome: Progressing Goal: Diagnostic test results will improve Outcome: Progressing Goal: Respiratory complications will improve Outcome: Progressing Goal: Cardiovascular complication will be avoided Outcome: Progressing   Problem: Activity: Goal: Risk for activity intolerance will decrease Outcome: Progressing   Problem: Nutrition: Goal: Adequate nutrition will be maintained Outcome: Progressing   Problem: Coping: Goal: Level of anxiety will decrease Outcome: Progressing   Problem: Elimination: Goal: Will not experience complications related to bowel motility Outcome: Progressing Goal: Will not experience complications related to urinary retention Outcome: Progressing   Problem: Pain Managment: Goal: General experience of comfort will improve and/or be controlled Outcome: Progressing   Problem: Safety: Goal:  Ability to remain free from injury will improve Outcome: Progressing   Problem: Skin Integrity: Goal: Risk for impaired skin integrity will decrease Outcome: Progressing

## 2023-10-30 NOTE — Progress Notes (Signed)
 Occupational Therapy Treatment Patient Details Name: Cody Hampton MRN: [id number] DOB: [birth year] Today's Date: 10/30/2023   History of present illness 62yo M admitted 10/22/23 with acute GIB and Afib/flutter in RVR, now returns with new black stools, palpitations, weakness, dizziness. Found to be back in Aflutter with RVR in the ED, FOBT (+) with  new GIB and admitted for care. PMH OA, depression, HTN, obesity   OT comments  Pt resting comfortably in bed, HR 89 at rest, 100-105 with light activity, standing, transferring, toileting. Pt states he feels close to baseline, feeling better. Pt requires help with socks/shoes at baseline, has had difficulty with perineal hygiene due to weight, typically performs with increased time/effort or in shower. Pt able to stand at bedside for several minutes with RW, CGA. OT assisted with hygiene. At this time Pt has no further acute OT needs, HHOT still recommended for follow up to maximize safety and functional dependence at home to reduce caregiver burden at home setting.       If plan is discharge home, recommend the following:  A little help with walking and/or transfers;A little help with bathing/dressing/bathroom;A lot of help with bathing/dressing/bathroom;Assist for transportation;Help with stairs or ramp for entrance   Equipment Recommendations  None recommended by OT    Recommendations for Other Services      Precautions / Restrictions Precautions Precautions: Fall Recall of Precautions/Restrictions: Intact Precaution/Restrictions Comments: monitor HR Restrictions Weight Bearing Restrictions Per Provider Order: No       Mobility Bed Mobility Overal bed mobility: Modified Independent                  Transfers Overall transfer level: Needs assistance Equipment used: Rolling walker (2 wheels) Transfers: Sit to/from Stand, Bed to chair/wheelchair/BSC Sit to Stand: Contact guard assist Stand pivot transfers: Contact guard  assist         General transfer comment: CGA for safety     Balance Overall balance assessment: Needs assistance Sitting-balance support: No upper extremity supported, Feet supported Sitting balance-Leahy Scale: Good     Standing balance support: No upper extremity supported, During functional activity Standing balance-Leahy Scale: Fair Standing balance comment: able to stand unsupported for several minutes                           ADL either performed or assessed with clinical judgement   ADL Overall ADL's : At baseline;Needs assistance/impaired Eating/Feeding: Independent   Grooming: Sitting;Set up           Upper Body Dressing : Sitting;Minimal assistance   Lower Body Dressing: Moderate assistance;Maximal assistance;Sit to/from stand   Toilet Transfer: Contact guard assist;Rolling walker (2 wheels)   Toileting- Clothing Manipulation and Hygiene: Moderate assistance;Sit to/from stand       Functional mobility during ADLs: Contact guard assist General ADL Comments: Pt doing well, feels back to baseline, sons help with LB dressing at baseline, uses RW for mobility in home    Extremity/Trunk Assessment Upper Extremity Assessment Upper Extremity Assessment: Overall WFL for tasks assessed   Lower Extremity Assessment Lower Extremity Assessment: Defer to PT evaluation        Vision       Perception     Praxis     Communication Communication Communication: No apparent difficulties   Cognition Arousal: Alert Behavior During Therapy: [facility] for tasks assessed/performed Cognition: No apparent impairments  Following commands: Intact        Cueing   Cueing Techniques: Verbal cues  Exercises      Shoulder Instructions       General Comments HR 89 at rest, sitting EOB, 100-105 during light activity at bedside, standing, transfering, toileting.    Pertinent Vitals/ Pain       Pain Assessment Pain  Assessment: No/denies pain  Home Living                                          Prior Functioning/Environment              Frequency  Min 2X/week        Progress Toward Goals  OT Goals(current goals can now be found in the care plan section)  Progress towards OT goals: Progressing toward goals  Acute Rehab OT Goals Patient Stated Goal: to return home OT Goal Formulation: With patient Time For Goal Achievement: 11/10/23 Potential to Achieve Goals: Good ADL Goals Pt Will Perform Upper Body Dressing: with set-up;sitting Pt Will Perform Lower Body Dressing: with min assist;sit to/from stand Pt Will Transfer to Toilet: with contact guard assist;ambulating Pt Will Perform Toileting - Clothing Manipulation and hygiene: with min assist;sit to/from stand Additional ADL Goal #1: patient to demonstrate sitting balance with one UE support or less for ADL tasks with no LOB.  Plan      Co-evaluation                 AM-PAC OT "6 Clicks" Daily Activity     Outcome Measure   Help from another person eating meals?: None Help from another person taking care of personal grooming?: A Little Help from another person toileting, which includes using toliet, bedpan, or urinal?: A Little Help from another person bathing (including washing, rinsing, drying)?: A Lot Help from another person to put on and taking off regular upper body clothing?: A Little Help from another person to put on and taking off regular lower body clothing?: A Lot 6 Click Score: 17    End of Session Equipment Utilized During Treatment: Rolling walker (2 wheels)  OT Visit Diagnosis: Unsteadiness on feet (R26.81);Other abnormalities of gait and mobility (R26.89)   Activity Tolerance Patient tolerated treatment well   Patient Left in chair;with call bell/phone within reach   Nurse Communication Mobility status        Time: 9562-1308 OT Time Calculation (min): 30 min  Charges: OT  General Charges $OT Visit: 1 Visit OT Treatments $Self Care/Home Management : 8-22 mins $Therapeutic Activity: 8-22 mins  Bobetta Korf, OTR/L   Rhen Kawecki R Lorenza Winkleman 10/30/2023, 11:01 AM

## 2023-10-30 NOTE — Discharge Summary (Signed)
 [facility]  Physician Discharge Summary   Patient ID: Cody Hampton MRN: [id number] DOB/AGE: 1960/08/17 63 y.o.  Admit date: 10/22/2023 Discharge date: 10/30/2023    PCP: Lazoff, Shawn P, DO  DISCHARGE DIAGNOSES:    Atrial flutter with rapid ventricular response (HCC)   Anxiety and depression   Class 3 severe obesity due to excess calories with serious comorbidity and body mass index (BMI) greater than or equal to 70 in adult [facility])   Essential (primary) hypertension   Gastroesophageal reflux disease   OSA (obstructive sleep apnea)   S/P bariatric surgery   GI bleeding   Chronic anticoagulation   RECOMMENDATIONS FOR OUTPATIENT FOLLOW UP: Cardiology to schedule outpatient follow-up Patient to follow-up with his gastroenterologist   Home Health: PT OT Equipment/Devices: None  CODE STATUS: Full code  DISCHARGE CONDITION: fair  Diet recommendation: Heart healthy  INITIAL HISTORY: 62 y.o. male with medical history significant of atrial flutter on eliquis , HTN, HLD, GERD, MGUS, OSA, prediabetes, depression and anxiety, class 3 obesity presenting to the ED with black stools and weakness.  Found to have low hemoglobin.  Recently admitted in March 2025 for GI bleed and underwent EGD which did not show any obvious source of bleeding.  Patient hospitalized again for GI work up.  He underwent video capsule endoscopy which did not show any evidence of GI bleeding.  He was restarted on Eliquis  4/18.     Consultants: Gastroenterology.  Cardiology   Procedures: Video capsule endoscopy  HOSPITAL COURSE:   Atrial flutter with RVR Cardiology following.  Heart rate has been difficult to control. Patient noted to be on digoxin , diltiazem . Underwent DC cardioversion.  Dose of digoxin  has been increased.  Cleared by cardiology. Patient was started back on Eliquis  and seems to be tolerating it well without any overt bleeding.    GI bleed/acute blood loss anemia Hemoglobin was  13.6 earlier this month.  Dropped down to between 8 and 9. Gastroenterology was consulted. Patient underwent video capsule endoscopy which did not show any evidence for bleeding.  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 Eliquis  was resumed.  No overt bleeding noted in the hospital. His primary gastroenterology is with [facility].  He will need to follow-up with them in the next few weeks. Patient with history of NSAID use.  Has been asked to discontinue NSAIDs. Continue PPI.   Hypotension Resolved    Hypocalcemia Received calcium  gluconate in the ED   Leukocytosis -likely reactive, no signs of infection monitor    History of anxiety and depression Continue Celexa .   Class III obesity Estimated body mass index is 58.61 kg/m as calculated from the following:   Height as of this encounter: 5\' 10"  (1.778 m).   Weight as of this encounter: 185.3 kg.  Patient is stable.  Okay for discharge home today.   PERTINENT LABS:  The results of significant diagnostics from this hospitalization (including imaging, microbiology, ancillary and laboratory) are listed below for reference.     Labs:   Basic Metabolic Panel: Recent Labs  Lab 10/25/23 0316 10/26/23 0243 10/27/23 0321 10/29/23 1551 10/30/23 0432  NA 136 138 135 138 136  K 3.9 3.8 3.5 4.5 3.8  CL 104 105 102 105 105  CO2 22 22 23 23 25   GLUCOSE 138* 117* 112* 126* 117*  BUN 10 12 14 10 13   CREATININE 0.81 0.73 0.67 0.65 0.74  CALCIUM  8.2* 8.2* 8.0* 8.1* 7.9*    CBC: Recent Labs  Lab  10/26/23 0243 10/27/23 0321 10/28/23 0351 10/29/23 0814 10/30/23 0432  WBC 14.3* 14.2* 14.0* 11.7* 12.2*  HGB 9.6* 8.9* 9.1* 8.6* 8.4*  HCT 30.9* 28.1* 29.1* 27.9* 27.2*  MCV 92.8 90.9 90.4 90.3 90.4  PLT 419* 472* 495* 503* 509*     CBG: Recent Labs  Lab 10/26/23 1053 10/27/23 0643 10/28/23 0559 10/29/23 0551 10/30/23 0603  GLUCAP 110* 128* 117* 132* 111*     IMAGING STUDIES ECHO TEE Result Date:  10/29/2023    TRANSESOPHOGEAL ECHO REPORT   Patient Name:   Cody Hampton Date of Exam: 10/29/2023 Medical Rec #:  [id number]        Height:       70.0 in Accession #:    [id number]       Weight:       408.7 lb Date of Birth:  02-11-1961        BSA:          2.828 m Patient Age:    62 years         BP:           158/73 mmHg Patient Gender: M                HR:           104 bpm. Exam Location:  Inpatient Procedure: Transesophageal Echo, 3D Echo, Cardiac Doppler and Color Doppler            (Both Spectral and Color Flow Doppler were utilized during            procedure). Indications:     R94.31 Abnormal EKG; I48.3 Typical atrial flutter  History:         Patient has prior history of Echocardiogram examinations, most                  recent 08/07/2023. Abnormal ECG, Arrythmias:Atrial Fibrillation                  and Atrial Flutter; Risk Factors:Sleep Apnea.  Sonographer:     Raynelle Callow RDCS Referring Phys:  [id number] Darline Eis HALEY Diagnosing Phys: Grady Lawman MD PROCEDURE: After discussion of the risks and benefits of a TEE, an informed consent was obtained from the patient. The patient was intubated. TEE procedure time was 11 minutes. The transesophogeal probe was passed without difficulty through the esophogus  of the patient. Imaged were obtained with the patient in a left lateral decubitus position. Sedation performed by different physician. The patient was monitored while under deep sedation. Anesthestetic sedation was provided intravenously by Anesthesiology: 200mg  of Propofol , 100mg  of Lidocaine . Image quality was good. The patient's vital signs; including heart rate, blood pressure, and oxygen  saturation; remained stable throughout the procedure. The patient developed no complications during  the procedure. A successful direct current cardioversion was performed at 200 joules with 1 attempt. Transgastric views not obtained: avoided due to recent GI bleed.  IMPRESSIONS  1. Left ventricular ejection fraction,  by estimation, is 55 to 60%. The left ventricle has normal function.  2. Right ventricular systolic function is mildly reduced. The right ventricular size is normal.  3. Left atrial size was mildly dilated. No left atrial/left atrial appendage thrombus was detected. The LAA emptying velocity was 67 cm/s.  4. Right atrial size was mildly dilated.  5. The mitral valve is normal in structure. Trivial mitral valve regurgitation. No evidence of mitral stenosis.  6. The aortic valve is tricuspid. Aortic valve regurgitation is  not visualized. No aortic stenosis is present.  7. 3D performed of the LAA and demonstrates normal LAA with no thrombus. Conclusion(s)/Recommendation(s): No LA/LAA thrombus identified. Successful cardioversion performed with restoration of normal sinus rhythm. FINDINGS  Left Ventricle: Left ventricular ejection fraction, by estimation, is 55 to 60%. The left ventricle has normal function. The left ventricular internal cavity size was normal in size. Right Ventricle: The right ventricular size is normal. No increase in right ventricular wall thickness. Right ventricular systolic function is mildly reduced. Left Atrium: Left atrial size was mildly dilated. No left atrial/left atrial appendage thrombus was detected. The LAA emptying velocity was 67 cm/s. Right Atrium: Right atrial size was mildly dilated. Pericardium: Trivial pericardial effusion is present. Mitral Valve: The mitral valve is normal in structure. Trivial mitral valve regurgitation. No evidence of mitral valve stenosis. Tricuspid Valve: The tricuspid valve is grossly normal. Tricuspid valve regurgitation is not demonstrated. No evidence of tricuspid stenosis. Aortic Valve: The aortic valve is tricuspid. Aortic valve regurgitation is not visualized. No aortic stenosis is present. Pulmonic Valve: The pulmonic valve was normal in structure. Pulmonic valve regurgitation is trivial. No evidence of pulmonic stenosis. Aorta: The aortic root and  ascending aorta are structurally normal, with no evidence of dilitation. IAS/Shunts: No atrial level shunt detected by color flow Doppler. Additional Comments: 3D was performed not requiring image post processing on an independent workstation and was normal. Spectral Doppler performed. LEFT VENTRICLE PLAX 2D LVOT diam:     2.30 cm LVOT Area:     4.15 cm   AORTA Ao Root diam: 3.10 cm Ao Asc diam:  3.10 cm  SHUNTS Systemic Diam: 2.30 cm Grady Lawman MD Electronically signed by Grady Lawman MD Signature Date/Time: 10/29/2023/1:23:50 PM    Final    EP STUDY Result Date: 10/29/2023 See surgical note for result.   DISCHARGE EXAMINATION: Vitals:   10/30/23 0452 10/30/23 0707 10/30/23 1057 10/30/23 1118  BP: (!) 137/52 (!) 115/56 (!) 141/82   Pulse: 91 91 88   Resp: 19 19 13    Temp: 98.1 F (36.7 C) 98.5 F (36.9 C)    TempSrc: Oral Oral    SpO2: 100% 96% 97% 97%  Weight: (!) 185.8 kg     Height:       General appearance: Awake alert.  In no distress Resp: Clear to auscultation bilaterally.  Normal effort Cardio: S1-S2 is normal regular.  No S3-S4.  No rubs murmurs or bruit GI: Abdomen is soft.  Nontender nondistended.  Bowel sounds are present normal.  No masses organomegaly    DISPOSITION: Home  Discharge Instructions     Amb referral to AFIB Clinic   Complete by: As directed    Call MD for:  difficulty breathing, headache or visual disturbances   Complete by: As directed    Call MD for:  extreme fatigue   Complete by: As directed    Call MD for:  persistant dizziness or light-headedness   Complete by: As directed    Call MD for:  persistant nausea and vomiting   Complete by: As directed    Call MD for:  severe uncontrolled pain   Complete by: As directed    Call MD for:  temperature >100.4   Complete by: As directed    Diet - low sodium heart healthy   Complete by: As directed    Discharge instructions   Complete by: As directed    Please take your medications as  prescribed.  Cardiology will schedule  outpatient follow-up.  You were cared for by a hospitalist during your hospital stay. If you have any questions about your discharge medications or the care you received while you were in the hospital after you are discharged, you can call the unit and asked to speak with the hospitalist on call if the hospitalist that took care of you is not available. Once you are discharged, your primary care physician will handle any further medical issues. Please note that NO REFILLS for any discharge medications will be authorized once you are discharged, as it is imperative that you return to your primary care physician (or establish a relationship with a primary care physician if you do not have one) for your aftercare needs so that they can reassess your need for medications and monitor your lab values. If you do not have a primary care physician, you can call [phone] for a physician referral.   Increase activity slowly   Complete by: As directed          Allergies as of 10/30/2023       Reactions   Metoprolol  Anxiety, Hypertension, Nausea Only, Other (See Comments), Photosensitivity, Shortness Of Breath   Duloxetine Other (See Comments)   Made mood worse.   Gabapentin Diarrhea, Other (See Comments)   "Not tolerated well."   Hydrocodone Other (See Comments)   "Made me feel odd in my head."        Medication List     STOP taking these medications    ibuprofen 200 MG tablet Commonly known as: ADVIL       TAKE these medications    apixaban  5 MG Tabs tablet Commonly known as: Eliquis  Take 1 tablet (5 mg total) by mouth 2 (two) times daily.   Bariatric Multivitamins/Iron  Caps Take 1 capsule by mouth daily with breakfast.   CALCIUM  PO Take 1 tablet by mouth daily.   citalopram  20 MG tablet Commonly known as: CELEXA  Take 1 tablet (20 mg total) by mouth 2 (two) times daily.   digoxin  0.25 MG tablet Commonly known as: LANOXIN  Take 1 tablet  (0.25 mg total) by mouth daily.   diltiazem  240 MG 24 hr capsule Commonly known as: CARDIZEM  CD Take 1 capsule (240 mg total) by mouth 2 (two) times daily. What changed:  medication strength how much to take   loperamide  2 MG tablet Commonly known as: IMODIUM  A-D Take 4 mg by mouth daily.   oxyCODONE -acetaminophen  5-325 MG tablet Commonly known as: PERCOCET/ROXICET Take 1 tablet by mouth daily as needed for moderate pain (pain score 4-6) or severe pain (pain score 7-10).   pantoprazole  40 MG tablet Commonly known as: PROTONIX  Take 1 tablet (40 mg total) by mouth daily. What changed:  when to take this reasons to take this   polycarbophil 625 MG tablet Commonly known as: FIBERCON Take 625 mg by mouth daily.   polyethylene glycol powder 17 GM/SCOOP powder Commonly known as: GLYCOLAX /MIRALAX  Take 17 g by mouth daily.   testosterone  cypionate 200 MG/ML injection Commonly known as: DEPOTESTOSTERONE CYPIONATE Inject into the muscle every 14 (fourteen) days.   Voltaren  Arthritis Pain 1 % Gel Generic drug: diclofenac  Sodium Apply 2 g topically 4 (four) times daily as needed (pain).          Follow-up Information     [facility] Follow up.   Why: Agency will call you to set up apt times Contact information: [phone]        Lazoff, Shawn P, DO  Follow up.   Specialty: Family Medicine Why: Please follow up in a week. Contact information: [address] [place] [address] [phone]                 TOTAL DISCHARGE TIME: 35 minutes  Gerren Hoffmeier [employer] on [email or web address]  10/31/2023, 10:22 AM

## 2023-10-30 NOTE — Progress Notes (Addendum)
 Cardiology Progress Note   Patient ID: Cody Hampton MRN: [id number] DOB: 04-07-1961 Date of Encounter: 10/30/2023 Primary Cardiologist: Euell Herrlich, MD  Subjective   Underwent TEE/DCCV yesterday with successful conversion to sinus rhythm. Had a brief period of afib of overnight, he is asymptomatic, but has been back in sinus today. LVEF 55-60%, mild RV dysfunction, mild biatrial enlargement. Hemoglobin 12.2 today.  Telemetry   Sinus rhythm (brief afib overnight) - personally reviewed  Physical Exam   Vitals:   10/29/23 2009 10/30/23 0014 10/30/23 0452 10/30/23 0707  BP: (!) 152/54 131/66 (!) 137/52 (!) 115/56  Pulse: 99 87 91 91  Resp: 18 20 19 19   Temp: 98.4 F (36.9 C) 98.4 F (36.9 C) 98.1 F (36.7 C) 98.5 F (36.9 C)  TempSrc: Oral Oral Oral Oral  SpO2: 99% 95% 100% 96%  Weight:   (!) 185.8 kg   Height:        Intake/Output Summary (Last 24 hours) at 10/30/2023 0937 Last data filed at 10/30/2023 0806 Gross per 24 hour  Intake 880 ml  Output 450 ml  Net 430 ml       10/30/2023    4:52 AM 10/29/2023    5:49 AM 10/28/2023    4:26 AM  Last 3 Weights  Weight (lbs) 409 lb 9.8 oz 408 lb 11.2 oz 408 lb 8 oz  Weight (kg) 185.8 kg 185.385 kg 185.294 kg    Body mass index is 58.77 kg/m.   General appearance: alert, no distress, morbidly obese, and pale Lungs: clear to auscultation bilaterally Heart: irregularly irregular rhythm Abdomen: soft, non-tender; bowel sounds normal; no masses,  no organomegaly Extremities: extremities normal, atraumatic, no cyanosis or edema Neurologic: Grossly normal   Cardiac Studies   TTE 08/07/2023  1. Left ventricular ejection fraction, by estimation, is 60 to 65%. The  left ventricle has normal function. The left ventricle has no regional  wall motion abnormalities. There is mild asymmetric left ventricular  hypertrophy of the septal segment. Left  ventricular diastolic parameters were normal.   2. Right ventricular  systolic function is normal. The right ventricular  size is severely enlarged. There is normal pulmonary artery systolic  pressure.   3. Right atrial size was mildly dilated.   4. The mitral valve is normal in structure. No evidence of mitral valve  regurgitation. No evidence of mitral stenosis.   5. The aortic valve is tricuspid. Aortic valve regurgitation is not  visualized. No aortic stenosis is present.   Patient Profile   Cody Hampton is a 63 y.o. male with morbid obesity (BMI 58), OSA, atrial flutter on Eliquis , hypertension, hyperlipidemia who was admitted on 10/22/2023 for acute GI bleed and atrial flutter.  Assessment & Plan   # Atrial flutter with RVR - Admitted with GI bleed.  EGD without source of bleeding, however, he was on long-term naproxen.  Eliquis  restarted.  Hemoglobin is stable. - Rates have been very difficult to control.  Currently on diltiazem  240 mg twice daily.  Also on digoxin  0.125 mg daily.  Intolerant of metoprolol . - Successful DCCV yesterday, but mild breakthrough afib. In sinus rhythm today. - Given BMI 58 (wt 185 kg) and normal renal function, will increase digoxin  to 0.25 mg daily (check digoxin  level today, he has been on 0.125 since 4/15) - hopefully, this will help with breakthrough afib  # Acute GI bleed - EGD negative.  Restarted Eliquis . Hemoglobin stable.   Suspect he can be discharged today from  a cardiac standpoint. No further recommendations. Can follow-up with Dr. Acharya.   [facility] will sign off.   Medication Recommendations:  as above Other recommendations (labs, testing, etc):  none Follow up as an outpatient:  He has afib clinic appt on 5/9   For questions or updates, please contact [facility] Please consult [email or web address] for contact info under   Hazle Lites, MD, [facility] [place], FNLA, FACP  [facility]  Medical Director of the [facility]  [facility]ovascular Risk Reduction  Clinic Diplomate of the American Board of Clinical Lipidology Attending Cardiologist  Direct Dial: [phone]  Fax: [phone]  Website:  [email or web address]  10/30/2023 9:37 AM

## 2023-10-30 NOTE — Progress Notes (Signed)
 Physical Therapy Treatment Patient Details Name: Cody Hampton MRN: [id number] DOB: [birth year] Today's Date: 10/30/2023   History of Present Illness 62yo M admitted 10/22/23 with acute GIB and Afib/flutter in RVR, now returns with new black stools, palpitations, weakness, dizziness. Found to be back in Aflutter with RVR in the ED, FOBT (+) with  new GIB and admitted for care. PMH OA, depression, HTN, obesity    PT Comments  Patient received in recliner. He is agreeable to PT session. Patient HR at rest WNL. Patient is able to stand with mod I. He ambulated 100 feet with rolling walker and supervision. Patient HR up to 120 with ambulation O2 saturations at 97%. Patient will continue to benefit from skilled PT to improve endurance and safety with mobility.    If plan is discharge home, recommend the following: A little help with walking and/or transfers;A little help with bathing/dressing/bathroom;Help with stairs or ramp for entrance;Assist for transportation   Can travel by private vehicle      yes  Equipment Recommendations  None recommended by PT    Recommendations for Other Services       Precautions / Restrictions Precautions Precautions: Fall Recall of Precautions/Restrictions: Intact Precaution/Restrictions Comments: monitor HR Restrictions Weight Bearing Restrictions Per Provider Order: No     Mobility  Bed Mobility               General bed mobility comments: NT patient received in recliner    Transfers Overall transfer level: Modified independent Equipment used: None Transfers: Sit to/from Stand Sit to Stand: Modified independent (Device/Increase time)           General transfer comment: increased time, but no assist needed from low recliner    Ambulation/Gait Ambulation/Gait assistance: Supervision Gait Distance (Feet): 100 Feet Assistive device: Rolling walker (2 wheels) Gait Pattern/deviations: Step-through pattern, Decreased step length -  right, Decreased step length - left, Decreased stride length Gait velocity: decr     General Gait Details: HR up to 120 with ambulation   Stairs             Wheelchair Mobility     Tilt Bed    Modified Rankin (Stroke Patients Only)       Balance Overall balance assessment: Modified Independent Sitting-balance support: Feet supported Sitting balance-Leahy Scale: Normal     Standing balance support: Bilateral upper extremity supported, During functional activity, Reliant on assistive device for balance Standing balance-Leahy Scale: Good                              Communication Communication Communication: No apparent difficulties  Cognition Arousal: Alert Behavior During Therapy: WFL for tasks assessed/performed   PT - Cognitive impairments: No apparent impairments                         Following commands: Intact      Cueing Cueing Techniques: Verbal cues  Exercises      General Comments General comments (skin integrity, edema, etc.): HR 89 at rest, sitting EOB, 100-105 during light activity at bedside, standing, transfering, toileting.      Pertinent Vitals/Pain Pain Assessment Pain Assessment: No/denies pain    Home Living Family/patient expects to be discharged to:: Private residence Living Arrangements: Spouse/significant other                      Prior Function  PT Goals (current goals can now be found in the care plan section) Acute Rehab PT Goals Patient Stated Goal: go home PT Goal Formulation: With patient Time For Goal Achievement: 11/09/23 Potential to Achieve Goals: Good Progress towards PT goals: Progressing toward goals    Frequency    Min 2X/week      PT Plan      Co-evaluation              AM-PAC PT "6 Clicks" Mobility   Outcome Measure  Help needed turning from your back to your side while in a flat bed without using bedrails?: None Help needed moving from  lying on your back to sitting on the side of a flat bed without using bedrails?: None Help needed moving to and from a bed to a chair (including a wheelchair)?: None Help needed standing up from a chair using your arms (e.g., wheelchair or bedside chair)?: None Help needed to walk in hospital room?: A Little Help needed climbing 3-5 steps with a railing? : A Little 6 Click Score: 22    End of Session   Activity Tolerance: Patient tolerated treatment well Patient left: in chair;with call bell/phone within reach Nurse Communication: Mobility status PT Visit Diagnosis: Muscle weakness (generalized) (M62.81);Difficulty in walking, not elsewhere classified (R26.2)     Time: 6213-0865 PT Time Calculation (min) (ACUTE ONLY): 12 min  Charges:    $Gait Training: 8-22 mins PT General Charges $$ ACUTE PT VISIT: 1 Visit                     Ailani Governale, PT, GCS 10/30/23,11:27 AM

## 2023-11-01 ENCOUNTER — Other Ambulatory Visit (HOSPITAL_COMMUNITY): Payer: Self-pay | Admitting: *Deleted

## 2023-11-02 ENCOUNTER — Encounter (HOSPITAL_COMMUNITY): Payer: Self-pay

## 2023-11-02 ENCOUNTER — Ambulatory Visit (HOSPITAL_COMMUNITY): Admit: 2023-11-02 | Admitting: Cardiology

## 2023-11-02 SURGERY — CARDIOVERSION (CATH LAB)
Anesthesia: General

## 2023-11-07 ENCOUNTER — Encounter: Payer: Self-pay | Admitting: Family Medicine

## 2023-11-12 ENCOUNTER — Other Ambulatory Visit: Payer: Medicare Other

## 2023-11-16 ENCOUNTER — Ambulatory Visit (HOSPITAL_COMMUNITY)
Admission: RE | Admit: 2023-11-16 | Discharge: 2023-11-16 | Disposition: A | Discharge status: Home / Self Care | Source: Ambulatory Visit | Attending: Internal Medicine | Admitting: Internal Medicine

## 2023-11-16 ENCOUNTER — Encounter (HOSPITAL_COMMUNITY): Payer: Self-pay | Admitting: Internal Medicine

## 2023-11-16 ENCOUNTER — Encounter (HOSPITAL_COMMUNITY): Payer: Self-pay | Admitting: *Deleted

## 2023-11-16 VITALS — BP 138/70 | HR 94 | Ht 70.0 in | Wt 394.6 lb

## 2023-11-16 DIAGNOSIS — I4892 Unspecified atrial flutter: Secondary | ICD-10-CM

## 2023-11-16 MED ORDER — DIGOXIN 250 MCG PO TABS
0.2500 mg | ORAL_TABLET | Freq: Every day | ORAL | 2 refills | Status: DC
  Filled 2023-11-23: qty 30, 30d supply, fill #0
  Filled 2023-12-17: qty 30, 30d supply, fill #1
  Filled 2024-01-14: qty 30, 30d supply, fill #2

## 2023-11-16 MED ORDER — DILTIAZEM HCL ER COATED BEADS 240 MG PO CP24
240.0000 mg | ORAL_CAPSULE | Freq: Two times a day (BID) | ORAL | 2 refills | Status: DC
  Filled 2023-11-23: qty 60, 30d supply, fill #0

## 2023-11-16 NOTE — Patient Instructions (Addendum)
 May stop Eliquis  after May 21st 2025   Top blood pressure 100-140 is within normal range lower blood pressure 60-85 within normal range. Heart rate 60-100 if staying above 110 maybe back afib    Dr Chancy Comber [phone]) -- will have scheduling reach out for appointment

## 2023-11-16 NOTE — Progress Notes (Signed)
 Primary Care Physician: Charmaine Coop, DO Primary Cardiologist: Euell Herrlich, MD Electrophysiologist: None     Referring Physician: ED   Cody Hampton is a 63 y.o. male with a history of HTN, obesity, OSA, and paroxysmal atrial fibrillation who presents for consultation in the [facility]. ED visit on 06/26/23 for new onset atrial flutter with RVR s/p successful DCCV but was noted to return into Afib HR 80. Patient notes it occurred while at dentist, concerning for local anesthetic systemic toxicity (LAST). He did not appear to have other symptoms of LAST. Discharged on Lopressor  12.5 mg BID. Patient is on Eliquis  5 mg BID for a CHADS2VASC score of 1.  On evaluation today, he is currently in NSR. Patient did not know he had returned into Afib after cardioversion. He has history of OSA. He does not drink alcohol. He does admit to stressful environment when taking son for his cancer treatment. He drinks 1 cup of coffee daily and then 54 oz of half no caffeine / half caffeinated soda. He does not know if he has had any other episodes of Afib since ED visit. He has been compliant with Lopressor  12.5 mg BID and Eliquis  5 mg BID.   On follow up 08/08/23, he is currently in NSR. He has had no episodes of Afib since last office visit. He stopped the Eliquis  due to cost.   On follow up 09/27/23, he is currently in atrial flutter with RVR. Wife contacted office noting patient was in sinus tachycardia at PCP office with HR 148. He is planning to have major dental work for removal of multiple upper teeth. We called diltiazem  120 mg daily yesterday to pharmacy but patient has not picked up yet. He will pick up Eliquis  later today as they typically go to the pharmacy every Thursday. He feels lightheaded and dizzy at times.  On follow up 10/18/23, he is currently in atrial flutter with RVR. Patient here with son today and we spoke with wife during visit via phone. Seen in ED on  3/20 after last office visit due to atrial flutter with RVR. Due to not being on anticoagulation he was discharged without intervention. Hospital admission 3/29-4/2 due to 3 days of dark tarry stools. He was still in Afib with RVR and started on Cardizem  drip. Digoxin  given while in hospital but discontinued prior to discharge. Hemoccult positive. S/p endoscopy with no obvious evidence of bleeding. Eliquis  resumed on discharge and patient given diltiazem  180 mg BID. He began full anticoagulation with Eliquis  on 4/3.   On follow up 11/16/23, he is currently in NSR. S/p hospital admission 4/14-22/2025 for black stools and weakness with recent admission in March for GI bleed and EGD did not show any source of bleeding. S/p video capsule endoscopy which did not show GI bleeding so Eliquis  restarted 4/18. Advised to discontinue NSAIDs. S/p successful TEE/DCCV on 4/21. HR difficult to control during admission and patient had dose of digoxin  increased to 0.25 mg daily. He is on diltiazem  240 mg twice daily. Currently taking Eliquis  5 mg BID.   Today, he denies symptoms of palpitations, chest pain, shortness of breath, orthopnea, PND, lower extremity edema, dizziness, presyncope, syncope, snoring, daytime somnolence, bleeding, or neurologic sequela. The patient is tolerating medications without difficulties and is otherwise without complaint today.    Atrial Fibrillation Risk Factors:  he does have symptoms or diagnosis of sleep apnea.  he has a BMI of Body mass index is  56.62 kg/m.Aaron Aas Filed Weights   11/16/23 1113  Weight: (!) 179 kg     Current Outpatient Medications  Medication Sig Dispense Refill   apixaban  (ELIQUIS ) 5 MG TABS tablet Take 1 tablet (5 mg total) by mouth 2 (two) times daily. 60 tablet 11   CALCIUM  PO Take 1 tablet by mouth daily.     citalopram  (CELEXA ) 20 MG tablet Take 1 tablet (20 mg total) by mouth 2 (two) times daily. 60 tablet 0   diclofenac  Sodium (VOLTAREN  ARTHRITIS PAIN) 1 %  GEL Apply 2 g topically 4 (four) times daily as needed (pain).     digoxin  (LANOXIN ) 0.25 MG tablet Take 1 tablet (0.25 mg total) by mouth daily. 30 tablet 2   diltiazem  (CARDIZEM  CD) 240 MG 24 hr capsule Take 1 capsule (240 mg total) by mouth 2 (two) times daily. 60 capsule 2   loperamide  (IMODIUM  A-D) 2 MG tablet Take 4 mg by mouth daily.     Multiple Vitamins-Minerals (BARIATRIC MULTIVITAMINS/IRON ) CAPS Take 1 capsule by mouth daily with breakfast.     oxyCODONE -acetaminophen  (PERCOCET/ROXICET) 5-325 MG tablet Take 1 tablet by mouth daily as needed for moderate pain (pain score 4-6) or severe pain (pain score 7-10).     pantoprazole  (PROTONIX ) 40 MG tablet Take 1 tablet (40 mg total) by mouth daily. 30 tablet 2   polycarbophil (FIBERCON) 625 MG tablet Take 625 mg by mouth daily.     polyethylene glycol powder (GLYCOLAX /MIRALAX ) 17 GM/SCOOP powder Take 17 g by mouth daily. 238 g 0   testosterone  cypionate (DEPOTESTOSTERONE CYPIONATE) 200 MG/ML injection Inject into the muscle every 14 (fourteen) days.     No current facility-administered medications for this encounter.    Atrial Fibrillation Management history:  Previous antiarrhythmic drugs: none Previous cardioversions: 06/26/23 (ED), 10/29/23 (TEE/DCCV) Previous ablations: none Anticoagulation history: Eliquis    ROS- All systems are reviewed and negative except as per the HPI above.  Physical Exam: BP 138/70   Pulse 94   Ht 5\' 10"  (1.778 m)   Wt (!) 179 kg   BMI 56.62 kg/m   GEN- The patient is well appearing, alert and oriented x 3 today.   Neck - no JVD or carotid bruit noted Lungs- Clear to ausculation bilaterally, normal work of breathing Heart- Regular rate and rhythm, no murmurs, rubs or gallops, PMI not laterally displaced Extremities- no clubbing, cyanosis, or edema Skin - no rash or ecchymosis noted   EKG today demonstrates  Vent. rate 94 BPM PR interval 156 ms QRS duration 90 ms QT/QTcB 346/432 ms P-R-T axes  12 -28 20 Normal sinus rhythm Normal ECG When compared with ECG of 29-Oct-2023 13:28, No significant change was found  Echo (TEE) 10/29/23: 1. Left ventricular ejection fraction, by estimation, is 55 to 60%. The  left ventricle has normal function.   2. Right ventricular systolic function is mildly reduced. The right  ventricular size is normal.   3. Left atrial size was mildly dilated. No left atrial/left atrial  appendage thrombus was detected. The LAA emptying velocity was 67 cm/s.   4. Right atrial size was mildly dilated.   5. The mitral valve is normal in structure. Trivial mitral valve  regurgitation. No evidence of mitral stenosis.   6. The aortic valve is tricuspid. Aortic valve regurgitation is not  visualized. No aortic stenosis is present.   7. 3D performed of the LAA and demonstrates normal LAA with no thrombus.   Cardiac monitor 06/2023-07/2023: Patch Wear Time:  13  days and 23 hours   HR 49 - 211, average 71 bpm. No atrial fibrillation detected. Occasional supraventricular ectopy, 3.5%. Rare ventricular ectopy. No sustained arrhythmias. No symptom trigger episodes. 41 SVT episodes, all less than 12 seconds     [facility] Cardiac Electrophysiology   ASSESSMENT & PLAN CHA2DS2-VASc Score = 1  The patient's score is based upon: CHF History: 0 HTN History: 1 Diabetes History: 0 Stroke History: 0 Vascular Disease History: 0 Age Score: 0 Gender Score: 0       ASSESSMENT AND PLAN: Persistent Atrial Flutter The patient's CHA2DS2-VASc score is 1, indicating a 0.6% annual risk of stroke.    He is currently in NSR. Will continue rate control with diltiazem  and digoxin  without change. He is going to stop Eliquis  4 weeks from date of cardioversion on 4/21. He will plan on performing dentition repair after the 4 week period which is okay from an Afib standpoint. He would like to establish care with Cardiology which is reasonable and will help him do so. It  is noted that after his birthday later this year he will be a Chadsvasc score of 2. He is going to follow up on sleep study. He has stopped ibuprofen.    Will help establish care with Cardiology. Follow up Afib clinic 6 months.   Minnie Amber, PA-C  Afib Clinic [facility] [address] [place], [place] [address] [phone]

## 2023-11-22 ENCOUNTER — Other Ambulatory Visit: Payer: Self-pay | Admitting: Nurse Practitioner

## 2023-11-22 ENCOUNTER — Encounter: Payer: Self-pay | Admitting: Internal Medicine

## 2023-11-22 ENCOUNTER — Other Ambulatory Visit (HOSPITAL_COMMUNITY): Payer: Self-pay

## 2023-11-22 ENCOUNTER — Encounter: Payer: Self-pay | Admitting: Hematology

## 2023-11-22 ENCOUNTER — Encounter (HOSPITAL_COMMUNITY): Payer: Self-pay

## 2023-11-22 NOTE — Telephone Encounter (Signed)
 Spoke with wife. Last time Dr Salomon Cree saw patient was last year. Encouraged to follow up with GI and Cardiology.

## 2023-11-23 ENCOUNTER — Other Ambulatory Visit (HOSPITAL_COMMUNITY): Payer: Self-pay

## 2023-11-26 ENCOUNTER — Ambulatory Visit: Payer: Medicare Other | Admitting: Hematology

## 2023-11-26 ENCOUNTER — Other Ambulatory Visit (HOSPITAL_COMMUNITY): Payer: Self-pay

## 2023-11-26 ENCOUNTER — Other Ambulatory Visit: Payer: Self-pay | Admitting: Nurse Practitioner

## 2023-11-26 ENCOUNTER — Telehealth (HOSPITAL_COMMUNITY): Payer: Self-pay | Admitting: *Deleted

## 2023-11-26 MED ORDER — DILTIAZEM HCL 120 MG PO TABS
240.0000 mg | ORAL_TABLET | Freq: Two times a day (BID) | ORAL | 2 refills | Status: DC
  Filled 2023-11-26: qty 360, 90d supply, fill #0
  Filled 2024-02-18: qty 360, 90d supply, fill #1

## 2023-11-26 NOTE — Telephone Encounter (Signed)
 Patient wife called in stating he continues to have labile blood pressure readings ranging from 130-165/60-70s despite current medications. Wife is concerned with history of gastric bypass surgery and use of extended release medications. Discussed with Caesar Caster PA will stop cardizem  CD and start diltiazem  240mg  twice a day. Wife will call with update of response in a week.

## 2023-11-27 ENCOUNTER — Other Ambulatory Visit (HOSPITAL_COMMUNITY): Payer: Self-pay

## 2023-11-27 ENCOUNTER — Other Ambulatory Visit: Payer: Self-pay

## 2023-11-28 ENCOUNTER — Other Ambulatory Visit: Payer: Self-pay

## 2023-12-06 NOTE — Telephone Encounter (Signed)
 Spoke with wife and she has rescheduled appointment already

## 2023-12-06 NOTE — Progress Notes (Deleted)
   OFFICE NOTE Date:  12/06/2023  ID:  Roxane Copp, DOB 10/05/1960, MRN [id number] PCP: Charmaine Coop, DO  [facility] Providers Cardiologist:  Euell Herrlich, MD Cardiology APP:  Nathanel Bal, PA-C { Click to update primary MD,subspecialty MD or APP then REFRESH:1}    Patient Profile:     Paroxysmal atrial fibrillation/flutter Not ablation candidate due to obesity  S/p DCCV 06/2023  S/p TEE DCCV 10/2023 TTE 08/07/2023: EF 60-65, no RWMA, mild asymmetric LVH, normal RVSF, severe RVE, mild RAE TEE 10/29/2023: EF 55-60, mildly reduced RVSF, mildly dilated LA, no LAA clot, mild RAE, trivial MR Hx of recurrent GI bleed  Hypertension Hyperlipidemia  Pre-diabetes  GERD MGUS Obesity S/p gastric bypass w Roux-en-Y (GI at [facility]) OSA       Discussed the use of AI scribe software for clinical note transcription with the patient, who gave verbal consent to proceed. History of Present Illness  He has been followed in the atrial fibrillation clinic since December 2024 when he was initially diagnosed with atrial fibrillation.  He was last seen 11/16/23. He was s/p admission and maintaining NSR. Pt wanted to establish with Cardiology, Dr. Chancy Comber. Request was to schedule appt with APP.   Pt was previously admitted with GI bleed in 09/2023. Pt was admitted again 4/14-4/22 with acute GI bleed w Hgb down to 8 requiring Tx w PRBCs. EGD w/o source of bleed. Capsule endo w/o source of bleed. NSAIDs DC'd. Pt was placed on PPI. He was able to restart Eliquis . Pt was in AF w RVR. Rates were difficult to control. He required Dilt 240, Dig 0.25. Pt is intol of Metoprolol . He underwent TEE DCCV w restoration of NSR.     ROS-See HPI***    Studies Reviewed:      *** Results Labs-chart review 10/06/2023: TSH 3.02 10/22/2023: Albumin 3, total protein 5.8 10/30/2023: K 3.8, creatinine 0.74, Hgb 8.4, PLT 509K, digoxin  0.5 Risk Assessment/Calculations: {Does this patient have ATRIAL  FIBRILLATION?[phone]} No BP recorded.  {Refresh Note OR Click here to enter BP  :1}***      Physical Exam:  VS:  There were no vitals taken for this visit.   Wt Readings from Last 3 Encounters:  11/16/23 (!) 394 lb 9.6 oz (179 kg)  10/30/23 (!) 409 lb 9.8 oz (185.8 kg)  10/18/23 (!) 419 lb (190.1 kg)    Physical Exam***     Assessment and Plan: Assessment & Plan Atrial fibrillation/flutter (HCC)  Assessment and Plan Assessment & Plan    {      :1}    {Are you ordering a CV Procedure (e.g. stress test, cath, DCCV, TEE, etc)?   Press F2        :[id number]}  Dispo:  No follow-ups on file. Signed, Marlyse Single, PA-C

## 2023-12-07 ENCOUNTER — Ambulatory Visit: Admitting: Physician Assistant

## 2023-12-10 ENCOUNTER — Other Ambulatory Visit (HOSPITAL_COMMUNITY): Payer: Self-pay

## 2023-12-10 ENCOUNTER — Telehealth (HOSPITAL_COMMUNITY): Payer: Self-pay | Admitting: *Deleted

## 2023-12-10 MED ORDER — LISINOPRIL 10 MG PO TABS
10.0000 mg | ORAL_TABLET | Freq: Every day | ORAL | 3 refills | Status: DC
  Filled 2023-12-10: qty 90, 90d supply, fill #0
  Filled 2024-02-18: qty 90, 90d supply, fill #1

## 2023-12-10 NOTE — Telephone Encounter (Signed)
 Patients wife called in with concerns of elevated BP due to change of Diltiazem  formulation. BP running 155-159/70-80.  Discussed with Caesar Caster PA will start lisinopril 10mg  daily. RX sent and wife verbalized understanding.

## 2023-12-11 ENCOUNTER — Other Ambulatory Visit: Payer: Self-pay

## 2023-12-17 ENCOUNTER — Encounter (HOSPITAL_COMMUNITY): Payer: Self-pay

## 2023-12-17 ENCOUNTER — Other Ambulatory Visit (HOSPITAL_COMMUNITY): Payer: Self-pay

## 2023-12-18 ENCOUNTER — Ambulatory Visit: Payer: Self-pay | Admitting: Family Medicine

## 2023-12-21 ENCOUNTER — Encounter (HOSPITAL_COMMUNITY): Payer: Self-pay | Admitting: *Deleted

## 2023-12-24 NOTE — Progress Notes (Signed)
 Cardiology Office Note   Date:  01/07/2024  ID:  Regina, Coppolino 1960-11-11, MRN [id number] PCP: Deitra Morton Sebastian Nena, NP  [facility] Providers Cardiologist:  Soyla DELENA Merck, MD Cardiology APP:  Terra Fairy PARAS, PA-C   History of Present Illness Cody Hampton is a 63 y.o. male with a past medical history of atrial fibrillation, HTN, obesity, OSA, history of gastric bypass surgery. Patient presents today for a hospital follow up appointment   Patient was seen in the eD on 06/26/23 for new onset atrial flutter with RVR. Underwent successful DCCV, but was noted to return to atrial fibrillation with well controlled HR. He was started on lopressor  and eliquis . He established care with the afib clinic on 12/26 and was in NSR. He wore a cardiac monitor in 07/2023 that showed no atrial fibrillation, occasional PACs with 3.5% burden, rare PVCs, no sustained arrhythmias. He underwent echocardiogram 08/07/23 that showed EF 60-65%, no regional wall motion abnormalities, mild LVH, normal RV systolic function. Patient later stopped taking eliquis  because it was affecting his chemistry.   He was seen in the ED on 3/20 with aflutter with RVR. He was started back on eliquis  and bisoprolol  (refused metoprolol  due to fatigue). Later admitted 3/29 with a GI bleed. He was seen by GI and underwent endoscopy which did not show a source of bleeding. He was started back on eliquis . Bisoprolol  was stopped and he instead was treated with diltiazem . After her was discharged, he was seen by the afib clinic 10/18/23. At that time, he was in atrial flutter with rVR. He was consented for TEE guided DCCV.   He was admitted on 4/14 after he presented with black stools and weakness. Hemoglobin 8.8. Eliquis  was held. His HR was elevated and he was treated with IV Diltiazem . Also required digoxin  for rate control. EGD was without source of bleeding. He was started back on eliquis  and hemoglobin was stable. He  underwent TEE guided DCCV on 10/29/23 with successful restoration of normal sinus rhythm. He was discharged on digoxin , diltiazem , eliquis .   Seen in afib clinic 11/16/23. At that time, he was maintaining NSR. Given CHADS-VASc of 1, plan was for him to stop AC 4 weeks after DCCV.   On interview, patient reports that he has been having issues with fatigue for quite some time.  He does have history of OSA and wears a CPAP machine.  Reports good compliance.  He also has been having issues with shortness of breath.  Feels like if he walks even a short distance he feels short of breath and his oxygen  saturations dipped.  He is fairly inactive in his daily life, usually only walks from the bathroom to his bed.  He denies having chest pain.  He has occasional, brief palpitations.  These episodes are different than his episodes of A-fib in the past and he does not think he has had any A-fib recurrence.  He denies lower extremity swelling, dizziness, syncope, near syncope.  He is followed by the A-fib clinic and presents today to establish care with general cardiology.  His BP has been well-controlled.   Studies Reviewed Cardiac Studies & Procedures   ______________________________________________________________________________________________     ECHOCARDIOGRAM  ECHOCARDIOGRAM COMPLETE 08/07/2023  Narrative ECHOCARDIOGRAM REPORT    Patient Name:   Cody Hampton Date of Exam: 08/07/2023 Medical Rec #:  [id number]   Height:       70.0 in Accession #:    [id number]  Weight:  410.6 lb Date of Birth:  Dec 09, 1960   BSA:          2.834 m Patient Age:    62 years    BP:           151/77 mmHg Patient Gender: M           HR:           67 bpm. Exam Location:  Inpatient  Procedure: 2D Echo, Cardiac Doppler and Color Doppler  Indications:    Atrial Fibrillation  History:        Patient has no prior history of Echocardiogram examinations. Risk Factors:Hypertension and Sleep Apnea.  Sonographer:     Ozell Free Referring Phys: [phone] FAIRY JINNY HEINRICH   Sonographer Comments: Technically difficult study due to poor echo windows, patient is obese, suboptimal parasternal window, suboptimal apical window and suboptimal subcostal window. IMPRESSIONS   1. Left ventricular ejection fraction, by estimation, is 60 to 65%. The left ventricle has normal function. The left ventricle has no regional wall motion abnormalities. There is mild asymmetric left ventricular hypertrophy of the septal segment. Left ventricular diastolic parameters were normal. 2. Right ventricular systolic function is normal. The right ventricular size is severely enlarged. There is normal pulmonary artery systolic pressure. 3. Right atrial size was mildly dilated. 4. The mitral valve is normal in structure. No evidence of mitral valve regurgitation. No evidence of mitral stenosis. 5. The aortic valve is tricuspid. Aortic valve regurgitation is not visualized. No aortic stenosis is present.  Comparison(s): No prior Echocardiogram.  FINDINGS Left Ventricle: Left ventricular ejection fraction, by estimation, is 60 to 65%. The left ventricle has normal function. The left ventricle has no regional wall motion abnormalities. The left ventricular internal cavity size was normal in size. There is mild asymmetric left ventricular hypertrophy of the septal segment. Left ventricular diastolic parameters were normal.  Right Ventricle: The right ventricular size is severely enlarged. No increase in right ventricular wall thickness. Right ventricular systolic function is normal. There is normal pulmonary artery systolic pressure. The tricuspid regurgitant velocity is 2.75 m/s, and with an assumed right atrial pressure of 3 mmHg, the estimated right ventricular systolic pressure is 33.2 mmHg.  Left Atrium: Left atrial size was normal in size.  Right Atrium: Right atrial size was mildly dilated.  Pericardium: There is no evidence of  pericardial effusion.  Mitral Valve: The mitral valve is normal in structure. No evidence of mitral valve regurgitation. No evidence of mitral valve stenosis.  Tricuspid Valve: The tricuspid valve is normal in structure. Tricuspid valve regurgitation is not demonstrated. No evidence of tricuspid stenosis.  Aortic Valve: The aortic valve is tricuspid. Aortic valve regurgitation is not visualized. No aortic stenosis is present. Aortic valve mean gradient measures 5.0 mmHg. Aortic valve peak gradient measures 9.5 mmHg. Aortic valve area, by VTI measures 2.41 cm.  Pulmonic Valve: The pulmonic valve was normal in structure. Pulmonic valve regurgitation is not visualized. No evidence of pulmonic stenosis.  Aorta: The aortic root is normal in size and structure.  IAS/Shunts: The interatrial septum was not well visualized.   LEFT VENTRICLE PLAX 2D LVIDd:         5.30 cm      Diastology LVIDs:         3.30 cm      LV e' medial:    9.03 cm/s LV PW:         1.10 cm      LV E/e' medial:  12.4 LV IVS:        1.30 cm      LV e' lateral:   14.00 cm/s LVOT diam:     1.90 cm      LV E/e' lateral: 8.0 LV SV:         73 LV SV Index:   26 LVOT Area:     2.84 cm  LV Volumes (MOD) LV vol d, MOD A4C: 145.0 ml LV vol s, MOD A4C: 71.1 ml LV SV MOD A4C:     145.0 ml  RIGHT VENTRICLE RV Basal diam:  5.50 cm RV S prime:     18.00 cm/s TAPSE (M-mode): 2.9 cm  LEFT ATRIUM             Index        RIGHT ATRIUM           Index LA diam:        3.80 cm 1.34 cm/m   RA Area:     26.00 cm LA Vol (A2C):   50.8 ml 17.93 ml/m  RA Volume:   99.50 ml  35.11 ml/m LA Vol (A4C):   92.8 ml 32.75 ml/m LA Biplane Vol: 74.1 ml 26.15 ml/m AORTIC VALVE AV Area (Vmax):    2.28 cm AV Area (Vmean):   2.16 cm AV Area (VTI):     2.41 cm AV Vmax:           154.00 cm/s AV Vmean:          100.000 cm/s AV VTI:            0.301 m AV Peak Grad:      9.5 mmHg AV Mean Grad:      5.0 mmHg LVOT Vmax:         124.00  cm/s LVOT Vmean:        76.200 cm/s LVOT VTI:          0.256 m LVOT/AV VTI ratio: 0.85  AORTA Ao Root diam: 3.10 cm  MITRAL VALVE                TRICUSPID VALVE MV Area (PHT): 3.23 cm     TR Peak grad:   30.2 mmHg MV Decel Time: 235 msec     TR Vmax:        275.00 cm/s MV E velocity: 112.00 cm/s MV A velocity: 91.70 cm/s   SHUNTS MV E/A ratio:  1.22         Systemic VTI:  0.26 m Systemic Diam: 1.90 cm  Stanly Leavens MD Electronically signed by Stanly Leavens MD Signature Date/Time: 08/07/2023/12:07:41 PM    Final   TEE  ECHO TEE 10/29/2023  Narrative TRANSESOPHOGEAL ECHO REPORT    Patient Name:   Cody Hampton Date of Exam: 10/29/2023 Medical Rec #:  [id number]        Height:       70.0 in Accession #:    [id number]       Weight:       408.7 lb Date of Birth:  [birth year]        BSA:          2.828 m Patient Age:    62 years         BP:           158/73 mmHg Patient Gender: M                HR:  104 bpm. Exam Location:  Inpatient  Procedure: Transesophageal Echo, 3D Echo, Cardiac Doppler and Color Doppler (Both Spectral and Color Flow Doppler were utilized during procedure).  Indications:     R94.31 Abnormal EKG; I48.3 Typical atrial flutter  History:         Patient has prior history of Echocardiogram examinations, most recent 08/07/2023. Abnormal ECG, Arrythmias:Atrial Fibrillation and Atrial Flutter; Risk Factors:Sleep Apnea.  Sonographer:     Ellouise Mose RDCS Referring Phys:  [id number] THOM CROME HALEY Diagnosing Phys: Soyla Merck MD  PROCEDURE: After discussion of the risks and benefits of a TEE, an informed consent was obtained from the patient. The patient was intubated. TEE procedure time was 11 minutes. The transesophogeal probe was passed without difficulty through the esophogus of the patient. Imaged were obtained with the patient in a left lateral decubitus position. Sedation performed by different physician. The patient was  monitored while under deep sedation. Anesthestetic sedation was provided intravenously by Anesthesiology: 200mg  of Propofol , 100mg  of Lidocaine . Image quality was good. The patient's vital signs; including heart rate, blood pressure, and oxygen  saturation; remained stable throughout the procedure. The patient developed no complications during the procedure. A successful direct current cardioversion was performed at 200 joules with 1 attempt. Transgastric views not obtained: avoided due to recent GI bleed.  IMPRESSIONS   1. Left ventricular ejection fraction, by estimation, is 55 to 60%. The left ventricle has normal function. 2. Right ventricular systolic function is mildly reduced. The right ventricular size is normal. 3. Left atrial size was mildly dilated. No left atrial/left atrial appendage thrombus was detected. The LAA emptying velocity was 67 cm/s. 4. Right atrial size was mildly dilated. 5. The mitral valve is normal in structure. Trivial mitral valve regurgitation. No evidence of mitral stenosis. 6. The aortic valve is tricuspid. Aortic valve regurgitation is not visualized. No aortic stenosis is present. 7. 3D performed of the LAA and demonstrates normal LAA with no thrombus.  Conclusion(s)/Recommendation(s): No LA/LAA thrombus identified. Successful cardioversion performed with restoration of normal sinus rhythm.  FINDINGS Left Ventricle: Left ventricular ejection fraction, by estimation, is 55 to 60%. The left ventricle has normal function. The left ventricular internal cavity size was normal in size.  Right Ventricle: The right ventricular size is normal. No increase in right ventricular wall thickness. Right ventricular systolic function is mildly reduced.  Left Atrium: Left atrial size was mildly dilated. No left atrial/left atrial appendage thrombus was detected. The LAA emptying velocity was 67 cm/s.  Right Atrium: Right atrial size was mildly dilated.  Pericardium:  Trivial pericardial effusion is present.  Mitral Valve: The mitral valve is normal in structure. Trivial mitral valve regurgitation. No evidence of mitral valve stenosis.  Tricuspid Valve: The tricuspid valve is grossly normal. Tricuspid valve regurgitation is not demonstrated. No evidence of tricuspid stenosis.  Aortic Valve: The aortic valve is tricuspid. Aortic valve regurgitation is not visualized. No aortic stenosis is present.  Pulmonic Valve: The pulmonic valve was normal in structure. Pulmonic valve regurgitation is trivial. No evidence of pulmonic stenosis.  Aorta: The aortic root and ascending aorta are structurally normal, with no evidence of dilitation.  IAS/Shunts: No atrial level shunt detected by color flow Doppler.  Additional Comments: 3D was performed not requiring image post processing on an independent workstation and was normal. Spectral Doppler performed.  LEFT VENTRICLE PLAX 2D LVOT diam:     2.30 cm LVOT Area:     4.15 cm    AORTA Ao Root diam:  3.10 cm Ao Asc diam:  3.10 cm   SHUNTS Systemic Diam: 2.30 cm  Soyla Merck MD Electronically signed by Soyla Merck MD Signature Date/Time: 10/29/2023/1:23:50 PM    Final  MONITORS  LONG TERM MONITOR (3-14 DAYS) 07/27/2023  Narrative Patch Wear Time:  13 days and 23 hours  HR 49 - 211, average 71 bpm. No atrial fibrillation detected. Occasional supraventricular ectopy, 3.5%. Rare ventricular ectopy. No sustained arrhythmias. No symptom trigger episodes. 41 SVT episodes, all less than 12 seconds     [facility] Cardiac Electrophysiology       ______________________________________________________________________________________________      Risk Assessment/Calculations  CHA2DS2-VASc Score = 1   This indicates a 0.6% annual risk of stroke. The patient's score is based upon: CHF History: 0 HTN History: 1 Diabetes History: 0 Stroke History: 0 Vascular Disease History:  0 Age Score: 0 Gender Score: 0             Physical Exam VS:  BP 129/76   Pulse 84   Ht 5' 10 (1.778 m)   Wt (!) 371 lb (168.3 kg)   SpO2 96%   BMI 53.23 kg/m    Wt Readings from Last 3 Encounters:  01/07/24 (!) 371 lb (168.3 kg)  01/03/24 (!) 370 lb (167.8 kg)  01/02/24 (!) 371 lb (168.3 kg)    GEN: Well nourished, well developed in no acute distress.  elderly male. Sitting in the wheelchair  NECK: No JVD  CARDIAC:  RRR, no murmurs, rubs, gallops RESPIRATORY:  Clear to auscultation without rales, wheezing or rhonchi  ABDOMEN: Soft, non-tender, non-distended EXTREMITIES:  No edema in BLE; No deformity   ASSESSMENT AND PLAN  Persistent Atrial Flutter  - Initially diagnosed with atrial flutter in 06/2023. He underwent DCCV in the ED at that time. Later found to be back in aflutter in 09/2023. Attempted to rate control as patient had GI bleed and eliquis  was held.  - In 10/2023, patient was admitted with GI bleed and hemoglobin 8.8. Underwent EGD that did not show source of bleeding. Was able to tolerate eliquis  and underwent successful TEE guided DCCV on 10/29/23. Due to CHADS-VASc 1, eliquis  was stopped 4 weeks after DCCV  - Heart rate regular on exam today.  Patient reports brief and mild episodes of palpitations but nothing similar to what he felt when he was in A-fib - Continue digoxin  0.25 mg daily  - Ordered dig level  - Continue diltiazem  240 mg BID (unable to tolerate extended release due to history of gastric bypass)  - Of note, with BMI he is not a candidate for ablation. Not great candidate for antiarrhythmics with history of medication noncompliance (has been intermittently noncompliant with eliquis )   Shortness of breath - Patient reports having episodes of shortness of breath on exertion.  He only gets short of breath even when walking short distances. - Denies lower extremity swelling, orthopnea.  He admits that he is not very active in his daily life, and only  walks between his bed and the bathroom - Most recent echo from 07/2023 showed EF 60-65%, normal RV function.  With new symptoms, ordered repeat echo to rule out heart failure - Encourage patient to increase physical activity as tolerated.  I do suspect that his shortness of breath is in part due to his inactivity  HTN  - BP well-controlled.  No symptoms of orthostatic hypotension - Continue diltiazem  240 mg twice daily, lisinopril  10 mg daily - Labs followed by PCP  [facility]  - Reports compliance with CPAP   Dispo: Follow up with Dr. Loni to establish care   Signed, Rollo FABIENE Louder, PA-C

## 2023-12-25 ENCOUNTER — Other Ambulatory Visit: Payer: Self-pay | Admitting: Physician Assistant

## 2023-12-25 DIAGNOSIS — D472 Monoclonal gammopathy: Secondary | ICD-10-CM

## 2023-12-26 ENCOUNTER — Other Ambulatory Visit: Payer: Self-pay

## 2023-12-26 ENCOUNTER — Inpatient Hospital Stay: Attending: Physician Assistant

## 2023-12-26 ENCOUNTER — Other Ambulatory Visit: Payer: Self-pay | Admitting: *Deleted

## 2023-12-26 ENCOUNTER — Ambulatory Visit: Admitting: Hematology

## 2023-12-26 ENCOUNTER — Other Ambulatory Visit

## 2023-12-26 ENCOUNTER — Other Ambulatory Visit (HOSPITAL_COMMUNITY): Payer: Self-pay

## 2023-12-26 ENCOUNTER — Inpatient Hospital Stay: Admitting: Physician Assistant

## 2023-12-26 VITALS — BP 115/62 | HR 86 | Temp 97.4°F | Resp 18 | Wt 379.8 lb

## 2023-12-26 DIAGNOSIS — D508 Other iron deficiency anemias: Secondary | ICD-10-CM

## 2023-12-26 DIAGNOSIS — E538 Deficiency of other specified B group vitamins: Secondary | ICD-10-CM

## 2023-12-26 DIAGNOSIS — D472 Monoclonal gammopathy: Secondary | ICD-10-CM

## 2023-12-26 DIAGNOSIS — D509 Iron deficiency anemia, unspecified: Secondary | ICD-10-CM | POA: Insufficient documentation

## 2023-12-26 DIAGNOSIS — D72828 Other elevated white blood cell count: Secondary | ICD-10-CM

## 2023-12-26 DIAGNOSIS — R7989 Other specified abnormal findings of blood chemistry: Secondary | ICD-10-CM | POA: Insufficient documentation

## 2023-12-26 DIAGNOSIS — D72829 Elevated white blood cell count, unspecified: Secondary | ICD-10-CM | POA: Diagnosis not present

## 2023-12-26 DIAGNOSIS — E611 Iron deficiency: Secondary | ICD-10-CM | POA: Diagnosis not present

## 2023-12-26 DIAGNOSIS — Z9884 Bariatric surgery status: Secondary | ICD-10-CM | POA: Insufficient documentation

## 2023-12-26 LAB — CBC WITH DIFFERENTIAL (CANCER CENTER ONLY)
Abs Immature Granulocytes: 0.08 10*3/uL — ABNORMAL HIGH (ref 0.00–0.07)
Basophils Absolute: 0.1 10*3/uL (ref 0.0–0.1)
Basophils Relative: 1 %
Eosinophils Absolute: 0.2 10*3/uL (ref 0.0–0.5)
Eosinophils Relative: 1 %
HCT: 32.7 % — ABNORMAL LOW (ref 39.0–52.0)
Hemoglobin: 9.8 g/dL — ABNORMAL LOW (ref 13.0–17.0)
Immature Granulocytes: 0 %
Lymphocytes Relative: 15 %
Lymphs Abs: 2.7 10*3/uL (ref 0.7–4.0)
MCH: 22.3 pg — ABNORMAL LOW (ref 26.0–34.0)
MCHC: 30 g/dL (ref 30.0–36.0)
MCV: 74.5 fL — ABNORMAL LOW (ref 80.0–100.0)
Monocytes Absolute: 1.5 10*3/uL — ABNORMAL HIGH (ref 0.1–1.0)
Monocytes Relative: 8 %
Neutro Abs: 13.8 10*3/uL — ABNORMAL HIGH (ref 1.7–7.7)
Neutrophils Relative %: 75 %
Platelet Count: 575 10*3/uL — ABNORMAL HIGH (ref 150–400)
RBC: 4.39 MIL/uL (ref 4.22–5.81)
RDW: 17.7 % — ABNORMAL HIGH (ref 11.5–15.5)
WBC Count: 18.4 10*3/uL — ABNORMAL HIGH (ref 4.0–10.5)
nRBC: 0 % (ref 0.0–0.2)

## 2023-12-26 LAB — CMP (CANCER CENTER ONLY)
ALT: 10 U/L (ref 0–44)
AST: 15 U/L (ref 15–41)
Albumin: 3.6 g/dL (ref 3.5–5.0)
Alkaline Phosphatase: 90 U/L (ref 38–126)
Anion gap: 8 (ref 5–15)
BUN: 15 mg/dL (ref 8–23)
CO2: 25 mmol/L (ref 22–32)
Calcium: 8.7 mg/dL — ABNORMAL LOW (ref 8.9–10.3)
Chloride: 103 mmol/L (ref 98–111)
Creatinine: 0.73 mg/dL (ref 0.61–1.24)
GFR, Estimated: >60 mL/min (ref >60)
Glucose, Bld: 170 mg/dL — ABNORMAL HIGH (ref 70–99)
Potassium: 4 mmol/L (ref 3.5–5.1)
Sodium: 136 mmol/L (ref 135–145)
Total Bilirubin: 0.4 mg/dL (ref 0.0–1.2)
Total Protein: 7 g/dL (ref 6.5–8.1)

## 2023-12-26 LAB — IRON AND IRON BINDING CAPACITY (CC-WL,HP ONLY)
Iron: 34 ug/dL — ABNORMAL LOW (ref 45–182)
Saturation Ratios: 8 % — ABNORMAL LOW (ref 17.9–39.5)
TIBC: 417 ug/dL (ref 250–450)
UIBC: 383 ug/dL — ABNORMAL HIGH (ref 117–376)

## 2023-12-26 LAB — SEDIMENTATION RATE: Sed Rate: 38 mm/h — ABNORMAL HIGH (ref 0–16)

## 2023-12-26 LAB — VITAMIN B12: Vitamin B-12: 482 pg/mL (ref 180–914)

## 2023-12-26 LAB — C-REACTIVE PROTEIN: CRP: 2.7 mg/dL — ABNORMAL HIGH (ref <1.0)

## 2023-12-26 LAB — FERRITIN: Ferritin: 8 ng/mL — ABNORMAL LOW (ref 24–336)

## 2023-12-26 MED ORDER — TRAMADOL HCL 50 MG PO TABS
50.0000 mg | ORAL_TABLET | Freq: Four times a day (QID) | ORAL | 0 refills | Status: DC | PRN
  Filled 2023-12-26: qty 120, 30d supply, fill #0

## 2023-12-26 NOTE — Progress Notes (Signed)
 HEMATOLOGY/ONCOLOGY PROGRESS NOTE  Date of Service: 12/26/2023  Patient Care Team: Charmaine Coop, DO as PCP - General (Family Medicine) Euell Herrlich, MD as PCP - Cardiology (Cardiology) Nathanel Bal, PA-C as Physician Assistant (Cardiology)  CHIEF COMPLAINTS/PURPOSE OF CONSULTATION:  IgG kappa MGUS with leukocytosis. B12 deficiency Iron  deficiency  INTERVAL HISTORY:  Cody Hampton is a wonderful 63 y.o. male who is in clinic for continued evaluation and management of IgG kappa MGUS with leukocytosis. He was last seen by Dr. Salomon Cree on 12/25/2022. In the interim, he was admitted from 10/22/2023-10/30/2023 for atrial flutter with RVR.  Cody Hampton reports he is recovering from the recent hospitalization. He reports his appetite is stable. He denies nausea, vomiting or bowel habit changes. He denies easy bruising or signs of active bleeding such as hematochezia or melena. He denies fevers, chills, sweats, shortness of breath, chest pain or cough. He has no other complaints.   MEDICAL HISTORY:  Past Medical History:  Diagnosis Date   Arthritis    in knees   Depression    Hypertension    Obesity    Severe sleep apnea     SURGICAL HISTORY: Gastric bypass surgery   SOCIAL HISTORY: Social History   Socioeconomic History   Marital status: Married    Spouse name: Not on file   Number of children: Not on file   Years of education: Not on file   Highest education level: Not on file  Occupational History   Not on file  Tobacco Use   Smoking status: Never   Smokeless tobacco: Never   Tobacco comments:    Never smoked 07/05/23  Substance and Sexual Activity   Alcohol use: Not Currently   Drug use: Never   Sexual activity: Not on file  Other Topics Concern   Not on file  Social History Narrative   Not on file   Social Drivers of Health   Financial Resource Strain: Low Risk  (01/10/2022)   Received from [facility] visits prior to 09/09/2022.,  [facility]   Overall Financial Resource Strain (CARDIA)    Difficulty of Paying Living Expenses: Not very hard  Food Insecurity: No Food Insecurity (10/23/2023)   Hunger Vital Sign    Worried About Running Out of Food in the Last Year: Never true    Ran Out of Food in the Last Year: Never true  Transportation Needs: No Transportation Needs (10/23/2023)   PRAPARE - Administrator, Civil Service (Medical): No    Lack of Transportation (Non-Medical): No  Physical Activity: Insufficiently Active (01/10/2022)   Received from [facility] visits prior to 09/09/2022., [facility]   Exercise Vital Sign    On average, how many days per week do you engage in moderate to strenuous exercise (like a brisk walk)?: 3 days    On average, how many minutes do you engage in exercise at this level?: 10 min  Stress: Stress Concern Present (01/10/2022)   Received from [facility] visits prior to 09/09/2022.   [employer] of Occupational Health - Occupational Stress Questionnaire    Feeling of Stress : To some extent  Social Connections: Unknown (01/10/2022)   Received from [facility] visits prior to 09/09/2022., [facility]   Social Connection and Isolation Panel    Frequency of Communication with Friends and Family: Not on file    How often do you get  together with friends or relatives?: Never    How often do you attend church or religious services?: Never    Do you belong to any clubs or organizations such as church groups, unions, fraternal or athletic groups, or school groups?: No    How often do you attend meetings of the clubs or organizations you belong to?: Never    Are you married, widowed, divorced, separated, never married, or living with a partner?: Married  Intimate Partner Violence: Not At Risk (10/23/2023)   Humiliation, Afraid, Rape, and Kick questionnaire    Fear of Current or Ex-Partner: No     Emotionally Abused: No    Physically Abused: No    Sexually Abused: No    FAMILY HISTORY: Reports that his father had bladder cancer and was not a smoker. Patient denies any history of smoking. He also reports that two of his sisters have cancer, but is unsure of the type. He notes that one of his sisters endorses a lymph node cancer.   ALLERGIES:  is allergic to metoprolol , duloxetine, gabapentin, and hydrocodone.  MEDICATIONS:  Current Outpatient Medications  Medication Sig Dispense Refill   CALCIUM  PO Take 1 tablet by mouth daily.     citalopram  (CELEXA ) 20 MG tablet Take 1 tablet (20 mg total) by mouth 2 (two) times daily. 60 tablet 0   diclofenac  Sodium (VOLTAREN  ARTHRITIS PAIN) 1 % GEL Apply 2 g topically 4 (four) times daily as needed (pain).     digoxin  (LANOXIN ) 0.25 MG tablet Take 1 tablet (0.25 mg total) by mouth daily. 30 tablet 2   diltiazem  (CARDIZEM ) 120 MG tablet Take 2 tablets (240 mg total) by mouth 2 (two) times daily. 360 tablet 2   lisinopril  (ZESTRIL ) 10 MG tablet Take 1 tablet (10 mg total) by mouth daily. 90 tablet 3   loperamide  (IMODIUM  A-D) 2 MG tablet Take 4 mg by mouth daily.     Multiple Vitamins-Minerals (BARIATRIC MULTIVITAMINS/IRON ) CAPS Take 1 capsule by mouth daily with breakfast.     oxyCODONE -acetaminophen  (PERCOCET/ROXICET) 5-325 MG tablet Take 1 tablet by mouth daily as needed for moderate pain (pain score 4-6) or severe pain (pain score 7-10).     pantoprazole  (PROTONIX ) 40 MG tablet Take 1 tablet (40 mg total) by mouth daily. 30 tablet 2   polycarbophil (FIBERCON) 625 MG tablet Take 625 mg by mouth daily.     polyethylene glycol powder (GLYCOLAX /MIRALAX ) 17 GM/SCOOP powder Take 17 g by mouth daily. 238 g 0   testosterone  cypionate (DEPOTESTOSTERONE CYPIONATE) 200 MG/ML injection Inject into the muscle every 14 (fourteen) days.     traMADol (ULTRAM) 50 MG tablet Take 1 tablet (50 mg total) by mouth every 6 (six) hours as needed. 120 tablet 0    apixaban  (ELIQUIS ) 5 MG TABS tablet Take 1 tablet (5 mg total) by mouth 2 (two) times daily. (Patient not taking: Reported on 12/26/2023) 60 tablet 11   No current facility-administered medications for this visit.    REVIEW OF SYSTEMS:    10 Point review of Systems was done is negative except as noted above.  PHYSICAL EXAMINATION:  Vitals:   12/26/23 0945  BP: 115/62  Pulse: 86  Resp: 18  Temp: (!) 97.4 F (36.3 C)  SpO2: 95%    Constitutional: Oriented to person, place, and time and well-developed, well-nourished, and in no distress.  HENT:  Head: Normocephalic and atraumatic.  Eyes: Conjunctivae are normal. Right eye exhibits no discharge. Left eye exhibits no discharge. No scleral  icterus.  Cardiovascular: Regular rhythm and rate, normal heart sounds  Pulmonary/Chest: Effort normal and breath sounds normal. No respiratory distress. No wheezes. No rales.   Musculoskeletal: Normal range of motion. Exhibits no edema.  Lymphadenopathy: No cervical adenopathy.  Neurological: Alert and oriented to person, place, and time.  Skin: Skin is warm and dry. No rash noted. Not diaphoretic. No erythema. No pallor.  Psychiatric: Mood, memory and judgment normal.    LABORATORY DATA:  I have reviewed the data as listed  .    Latest Ref Rng & Units 12/26/2023    9:15 AM 10/30/2023    4:32 AM 10/29/2023    8:14 AM  CBC  WBC 4.0 - 10.5 K/uL 18.4  12.2  11.7   Hemoglobin 13.0 - 17.0 g/dL 9.8  8.4  8.6   Hematocrit 39.0 - 52.0 % 32.7  27.2  27.9   Platelets 150 - 400 K/uL 575  509  503     .    Latest Ref Rng & Units 12/26/2023    9:15 AM 10/30/2023    4:32 AM 10/29/2023    3:51 PM  CMP  Glucose 70 - 99 mg/dL 161  096  045   BUN 8 - 23 mg/dL 15  13  10    Creatinine 0.61 - 1.24 mg/dL 4.09  8.11  9.14   Sodium 135 - 145 mmol/L 136  136  138   Potassium 3.5 - 5.1 mmol/L 4.0  3.8  4.5   Chloride 98 - 111 mmol/L 103  105  105   CO2 22 - 32 mmol/L 25  25  23    Calcium  8.9 - 10.3 mg/dL  8.7  7.9  8.1   Total Protein 6.5 - 8.1 g/dL 7.0     Total Bilirubin 0.0 - 1.2 mg/dL 0.4     Alkaline Phos 38 - 126 U/L 90     AST 15 - 41 U/L 15     ALT 0 - 44 U/L 10       CMP 05/26/2022:   PSA Screening 05/26/2022:     CBC and Differential 05/26/2022:       RADIOGRAPHIC STUDIES: I have personally reviewed the radiological images as listed and agreed with the findings in the report. No results found.  ASSESSMENT & PLAN:  Cody Hampton is a 63 y.o. male who presents to the clinic for continued management of MGUS.  Wonderful 63 y.o. male with:  1. MGUS (monoclonal gammopathy of unknown significance) 2. Leukocytosis, unspecified type 3. Essential hypertension 4. Anxiety and depression 5. Morbid obesity 6. OSA on CPAP 7.Iron  deficiency and B12 deficiency related to Gastric bypass surgery  PLAN: -Labs from today were reviewed with patient and wife who is on the phone. WBC 18.4, Hgb 9.8, MCV 74.5, Plt 575K, Creatinine and LFTs normal.  -Iron  panel confirmed deficiency with serum iron  34, saturation 8%. Will arrange for IV iron  infusion. Patient is requesting to transfer care for his gastroenterologist from [facility] to Dr. Nickey Barn who he saw during recent hospitalization. -Vitamin B12 level is normal.  -Will order sed rate and CRP levels to further evaluate leukocytosis. No underlying infectious symptoms including fevers, chills, etc.  -SPEP/IFE and Kappa/lambda light chain levels are pending.  -Last bone met survey was on 12/06/2022 without evidence lytic lesions. Repeat bone met survey due now, scheduled for 12/31/2023.  -Sent 30 day supply (no refills) for Tramadol for his arthritic pain as he is currently waiting to establish care with new  PCP.   FOLLOW UP: --RTC in 3 months to monitor iron  levels.     Orders Placed This Encounter  Procedures   Sedimentation rate    Standing Status:   Future    Number of Occurrences:   1    Expiration Date:   12/25/2024    C-reactive protein    Standing Status:   Future    Number of Occurrences:   1    Expiration Date:   12/25/2024   Vitamin B12    Standing Status:   Future    Number of Occurrences:   1    Expiration Date:   12/25/2024      All of the patient's questions were answered with apparent satisfaction. The patient knows to call the clinic with any problems, questions or concerns.   I have spent a total of 30 minutes minutes of face-to-face and non-face-to-face time, preparing to see the patient, performing a medically appropriate examination, counseling and educating the patient, ordering medications/tests/procedures, referring and communicating with other health care professionals, documenting clinical information in the electronic health record, independently interpreting results and communicating results to the patient, and care coordination.   Wyline Hearing PA-C Dept of Hematology and Oncology [facility] Phone: [phone]

## 2023-12-27 ENCOUNTER — Ambulatory Visit: Payer: Self-pay | Admitting: Physician Assistant

## 2023-12-27 ENCOUNTER — Telehealth: Payer: Self-pay | Admitting: Hematology

## 2023-12-27 ENCOUNTER — Telehealth: Payer: Self-pay | Admitting: Pharmacy Technician

## 2023-12-27 LAB — KAPPA/LAMBDA LIGHT CHAINS
Kappa free light chain: 66.6 mg/L — ABNORMAL HIGH (ref 3.3–19.4)
Kappa, lambda light chain ratio: 2.03 — ABNORMAL HIGH (ref 0.26–1.65)
Lambda free light chains: 32.8 mg/L — ABNORMAL HIGH (ref 5.7–26.3)

## 2023-12-27 NOTE — Telephone Encounter (Signed)
 Cody Hampton,  Patient has been approved and will be scheduled as soon as possible.  Auth Submission: APPROVED Site of care: Site of care: [facility] Payer: [employer] ADVE Medication & CPT/J Code(s) submitted: Feraheme (ferumoxytol) [id number] Diagnosis Code: D50.9 Route of submission (phone, fax, portal): FAXED Phone # Fax # Auth type: Buy/Bill PB Units/visits requested: X2 DOSES Reference number:  Approval from: 12/26/23 to 03/27/24

## 2023-12-27 NOTE — Telephone Encounter (Signed)
 Scheduled appointments per 6/18 los. Talked with the patients wife and she is aware of the made appointments for the patient.

## 2023-12-28 ENCOUNTER — Encounter: Payer: Self-pay | Admitting: Physician Assistant

## 2023-12-28 LAB — MULTIPLE MYELOMA PANEL, SERUM
Albumin SerPl Elph-Mcnc: 3 g/dL (ref 2.9–4.4)
Albumin/Glob SerPl: 0.9 (ref 0.7–1.7)
Alpha 1: 0.3 g/dL (ref 0.0–0.4)
Alpha2 Glob SerPl Elph-Mcnc: 1 g/dL (ref 0.4–1.0)
B-Globulin SerPl Elph-Mcnc: 1.4 g/dL — ABNORMAL HIGH (ref 0.7–1.3)
Gamma Glob SerPl Elph-Mcnc: 0.9 g/dL (ref 0.4–1.8)
Globulin, Total: 3.6 g/dL (ref 2.2–3.9)
IgA: 798 mg/dL — ABNORMAL HIGH (ref 61–437)
IgG (Immunoglobin G), Serum: 711 mg/dL (ref 603–1613)
IgM (Immunoglobulin M), Srm: 26 mg/dL (ref 20–172)
Total Protein ELP: 6.6 g/dL (ref 6.0–8.5)

## 2023-12-31 ENCOUNTER — Other Ambulatory Visit: Payer: Self-pay

## 2023-12-31 ENCOUNTER — Ambulatory Visit (HOSPITAL_COMMUNITY)
Admission: RE | Admit: 2023-12-31 | Discharge: 2023-12-31 | Disposition: A | Discharge status: Home / Self Care | Source: Ambulatory Visit | Attending: Physician Assistant | Admitting: Physician Assistant

## 2023-12-31 ENCOUNTER — Other Ambulatory Visit: Payer: Self-pay | Admitting: Physician Assistant

## 2023-12-31 DIAGNOSIS — D472 Monoclonal gammopathy: Secondary | ICD-10-CM | POA: Diagnosis present

## 2023-12-31 DIAGNOSIS — D72828 Other elevated white blood cell count: Secondary | ICD-10-CM

## 2023-12-31 DIAGNOSIS — E538 Deficiency of other specified B group vitamins: Secondary | ICD-10-CM | POA: Diagnosis not present

## 2024-01-01 ENCOUNTER — Other Ambulatory Visit: Payer: Self-pay

## 2024-01-01 DIAGNOSIS — D508 Other iron deficiency anemias: Secondary | ICD-10-CM

## 2024-01-01 NOTE — Progress Notes (Unsigned)
 New Patient Office Visit  Subjective   Patient ID: Cody Hampton, male    DOB: 07-06-1961  Age: 63 y.o. MRN: [id number]  CC: No chief complaint on file.   HPI Cody Hampton presents to establish care ***  Outpatient Encounter Medications as of 01/02/2024  Medication Sig   apixaban  (ELIQUIS ) 5 MG TABS tablet Take 1 tablet (5 mg total) by mouth 2 (two) times daily. (Patient not taking: Reported on 12/26/2023)   CALCIUM  PO Take 1 tablet by mouth daily.   citalopram  (CELEXA ) 20 MG tablet Take 1 tablet (20 mg total) by mouth 2 (two) times daily.   diclofenac  Sodium (VOLTAREN  ARTHRITIS PAIN) 1 % GEL Apply 2 g topically 4 (four) times daily as needed (pain).   digoxin  (LANOXIN ) 0.25 MG tablet Take 1 tablet (0.25 mg total) by mouth daily.   diltiazem  (CARDIZEM ) 120 MG tablet Take 2 tablets (240 mg total) by mouth 2 (two) times daily.   lisinopril  (ZESTRIL ) 10 MG tablet Take 1 tablet (10 mg total) by mouth daily.   loperamide  (IMODIUM  A-D) 2 MG tablet Take 4 mg by mouth daily.   Multiple Vitamins-Minerals (BARIATRIC MULTIVITAMINS/IRON ) CAPS Take 1 capsule by mouth daily with breakfast.   oxyCODONE -acetaminophen  (PERCOCET/ROXICET) 5-325 MG tablet Take 1 tablet by mouth daily as needed for moderate pain (pain score 4-6) or severe pain (pain score 7-10).   pantoprazole  (PROTONIX ) 40 MG tablet Take 1 tablet (40 mg total) by mouth daily.   polycarbophil (FIBERCON) 625 MG tablet Take 625 mg by mouth daily.   polyethylene glycol powder (GLYCOLAX /MIRALAX ) 17 GM/SCOOP powder Take 17 g by mouth daily.   testosterone  cypionate (DEPOTESTOSTERONE CYPIONATE) 200 MG/ML injection Inject into the muscle every 14 (fourteen) days.   traMADol  (ULTRAM ) 50 MG tablet Take 1 tablet (50 mg total) by mouth every 6 (six) hours as needed.   No facility-administered encounter medications on file as of 01/02/2024.    Past Medical History:  Diagnosis Date   Arthritis    in knees   Depression    Hypertension     Obesity    Severe sleep apnea     Past Surgical History:  Procedure Laterality Date   CARDIOVERSION N/A 10/29/2023   Procedure: CARDIOVERSION;  Surgeon: Loni Soyla LABOR, MD;  Location: [facility] INVASIVE CV LAB;  Service: Cardiovascular;  Laterality: N/A;   ESOPHAGOGASTRODUODENOSCOPY Left 10/09/2023   Procedure: EGD (ESOPHAGOGASTRODUODENOSCOPY);  Surgeon: Rollin Dover, MD;  Location: [facility];  Service: Gastroenterology;  Laterality: Left;  blood in stool   GIVENS CAPSULE STUDY N/A 10/24/2023   Procedure: IMAGING PROCEDURE, GI TRACT, INTRALUMINAL, VIA CAPSULE;  Surgeon: Rollin Dover, MD;  Location: [facility] ENDOSCOPY;  Service: Gastroenterology;  Laterality: N/A;   TRANSESOPHAGEAL ECHOCARDIOGRAM (CATH LAB) N/A 10/29/2023   Procedure: TRANSESOPHAGEAL ECHOCARDIOGRAM;  Surgeon: Loni Soyla LABOR, MD;  Location: [facility] INVASIVE CV LAB;  Service: Cardiovascular;  Laterality: N/A;    No family history on file.  Social History   Socioeconomic History   Marital status: Married    Spouse name: Not on file   Number of children: Not on file   Years of education: Not on file   Highest education level: Bachelor's degree (e.g., BA, AB, BS)  Occupational History   Not on file  Tobacco Use   Smoking status: Never   Smokeless tobacco: Never   Tobacco comments:    Never smoked 07/05/23  Substance and Sexual Activity   Alcohol use: Not Currently   Drug use: Never   Sexual  activity: Not on file  Other Topics Concern   Not on file  Social History Narrative   Not on file   Social Drivers of Health   Financial Resource Strain: Low Risk  (12/29/2023)   Overall Financial Resource Strain (CARDIA)    Difficulty of Paying Living Expenses: Not very hard  Food Insecurity: No Food Insecurity (12/29/2023)   Hunger Vital Sign    Worried About Running Out of Food in the Last Year: Never true    Ran Out of Food in the Last Year: Never true  Transportation Needs: Unmet Transportation Needs (12/29/2023)   PRAPARE -  Administrator, Civil Service (Medical): Yes    Lack of Transportation (Non-Medical): No  Physical Activity: Inactive (12/29/2023)   Exercise Vital Sign    Days of Exercise per Week: 0 days    Minutes of Exercise per Session: Not on file  Stress: Stress Concern Present (12/29/2023)   [employer] of Occupational Health - Occupational Stress Questionnaire    Feeling of Stress: Very much  Social Connections: Socially Isolated (12/29/2023)   Social Connection and Isolation Panel    Frequency of Communication with Friends and Family: Once a week    Frequency of Social Gatherings with Friends and Family: Never    Attends Religious Services: Never    Database administrator or Organizations: No    Attends Engineer, structural: Not on file    Marital Status: Married  Catering manager Violence: Not At Risk (10/23/2023)   Humiliation, Afraid, Rape, and Kick questionnaire    Fear of Current or Ex-Partner: No    Emotionally Abused: No    Physically Abused: No    Sexually Abused: No    ROS Negative unless indicated in HPI    Objective   There were no vitals taken for this visit.  Physical Exam  Last CBC Lab Results  Component Value Date   WBC 18.4 (H) 12/26/2023   HGB 9.8 (L) 12/26/2023   HCT 32.7 (L) 12/26/2023   MCV 74.5 (L) 12/26/2023   MCH 22.3 (L) 12/26/2023   RDW 17.7 (H) 12/26/2023   PLT 575 (H) 12/26/2023   Last metabolic panel Lab Results  Component Value Date   GLUCOSE 170 (H) 12/26/2023   NA 136 12/26/2023   K 4.0 12/26/2023   CL 103 12/26/2023   CO2 25 12/26/2023   BUN 15 12/26/2023   CREATININE 0.73 12/26/2023   GFRNONAA >60 12/26/2023   CALCIUM  8.7 (L) 12/26/2023   PHOS 2.6 10/07/2023   PROT 7.0 12/26/2023   ALBUMIN 3.6 12/26/2023   LABGLOB 3.6 12/26/2023   BILITOT 0.4 12/26/2023   ALKPHOS 90 12/26/2023   AST 15 12/26/2023   ALT 10 12/26/2023   ANIONGAP 8 12/26/2023   Last thyroid  functions Lab Results  Component Value Date    TSH 3.020 10/06/2023    Last vitamin B12 and Folate Lab Results  Component Value Date   VITAMINB12 482 12/26/2023        Assessment & Plan:  There are no diagnoses linked to this encounter. Continue healthy lifestyle choices, including diet (rich in fruits, vegetables, and lean proteins, and low in salt and simple carbohydrates) and exercise (at least 30 minutes of moderate physical activity daily).     The above assessment and management plan was discussed with the patient. The patient verbalized understanding of and has agreed to the management plan. Patient is aware to call the clinic if they develop any new  symptoms or if symptoms persist or worsen. Patient is aware when to return to the clinic for a follow-up visit. Patient educated on when it is appropriate to go to the emergency department.  No follow-ups on file.   Mykeisha Dysert [place] Thompson, DNP [facility] [address] [place], [place] [address] [phone]  Note: This document was prepared by [employer] voice dictation technology and any errors that results from this process are unintentional.

## 2024-01-02 ENCOUNTER — Ambulatory Visit (INDEPENDENT_AMBULATORY_CARE_PROVIDER_SITE_OTHER): Payer: Self-pay | Admitting: Nurse Practitioner

## 2024-01-02 ENCOUNTER — Ambulatory Visit: Payer: Self-pay | Admitting: Nurse Practitioner

## 2024-01-02 ENCOUNTER — Telehealth: Payer: Self-pay | Admitting: Nurse Practitioner

## 2024-01-02 ENCOUNTER — Encounter: Payer: Self-pay | Admitting: Nurse Practitioner

## 2024-01-02 ENCOUNTER — Telehealth: Payer: Self-pay | Admitting: Physician Assistant

## 2024-01-02 VITALS — BP 112/69 | HR 85 | Temp 97.0°F | Wt 371.0 lb

## 2024-01-02 DIAGNOSIS — E66813 Obesity, class 3: Secondary | ICD-10-CM | POA: Diagnosis not present

## 2024-01-02 DIAGNOSIS — R7303 Prediabetes: Secondary | ICD-10-CM

## 2024-01-02 DIAGNOSIS — F322 Major depressive disorder, single episode, severe without psychotic features: Secondary | ICD-10-CM

## 2024-01-02 DIAGNOSIS — R351 Nocturia: Secondary | ICD-10-CM

## 2024-01-02 DIAGNOSIS — I10 Essential (primary) hypertension: Secondary | ICD-10-CM | POA: Diagnosis not present

## 2024-01-02 DIAGNOSIS — R32 Unspecified urinary incontinence: Secondary | ICD-10-CM | POA: Insufficient documentation

## 2024-01-02 DIAGNOSIS — F419 Anxiety disorder, unspecified: Secondary | ICD-10-CM

## 2024-01-02 DIAGNOSIS — G8929 Other chronic pain: Secondary | ICD-10-CM | POA: Insufficient documentation

## 2024-01-02 DIAGNOSIS — M25561 Pain in right knee: Secondary | ICD-10-CM

## 2024-01-02 DIAGNOSIS — M1990 Unspecified osteoarthritis, unspecified site: Secondary | ICD-10-CM

## 2024-01-02 DIAGNOSIS — Z0001 Encounter for general adult medical examination with abnormal findings: Secondary | ICD-10-CM | POA: Insufficient documentation

## 2024-01-02 DIAGNOSIS — Z6841 Body Mass Index (BMI) 40.0 and over, adult: Secondary | ICD-10-CM

## 2024-01-02 DIAGNOSIS — R5383 Other fatigue: Secondary | ICD-10-CM

## 2024-01-02 DIAGNOSIS — M25562 Pain in left knee: Secondary | ICD-10-CM

## 2024-01-02 DIAGNOSIS — M545 Low back pain, unspecified: Secondary | ICD-10-CM

## 2024-01-02 LAB — UPEP/UIFE/LIGHT CHAINS/TP, 24-HR UR
% BETA, Urine: 28.5 %
ALPHA 1 URINE: 3 %
Albumin, U: 39.6 %
Alpha 2, Urine: 9.3 %
Free Kappa Lt Chains,Ur: 32.96 mg/L (ref 1.17–86.46)
Free Kappa/Lambda Ratio: 15.05 — ABNORMAL HIGH (ref 1.83–14.26)
Free Lambda Lt Chains,Ur: 2.19 mg/L (ref 0.27–15.21)
GAMMA GLOBULIN URINE: 19.6 %
Total Protein, Urine-Ur/day: 159 mg/(24.h) — ABNORMAL HIGH (ref 30–150)
Total Protein, Urine: 10.8 mg/dL
Total Volume: 1475

## 2024-01-02 LAB — BAYER DCA HB A1C WAIVED: HB A1C (BAYER DCA - WAIVED): 6.3 % — ABNORMAL HIGH (ref 4.8–5.6)

## 2024-01-02 NOTE — Telephone Encounter (Signed)
 Patient's wife upset with our office because we would not prescribe pain medications and also would not let her be on the phone with patient while he was seeing provider.  Patient has a lot of mental issues and forgetful.  Wife is bedridden so wanted to be on the phone to help answer questions if needed and to know what is going on with him.  I explained to her that a new patient is not prescribed controlled substances on their first visit and that provider makes referral or decides the protocol for the medication needs.  Wife states to cancel his appt for October and that they will not be back.

## 2024-01-02 NOTE — Telephone Encounter (Signed)
 I spoke to patient's wife, Verneita, to review the lab results from 12/26/2023. MGUS labs were overall stable without any transformation to active myeloma. Iron  levels confirm iron  deficiency so we will arrange for IV iron  infusion at [facility] center. We we make request for patient to follow up with [facility] GI team to evaluate underlying cause of iron  deficiency.   Regarding leukocytosis seen on labs, there is mild elevation of inflammatory markers but recommend further evaluation with MPN panel and BCR/ABL FISH. Patient will return later this week to obtain additional requested labs.   Patient will return in 3 months with labs and follow up. Verneita expressed understanding of the plan provided.

## 2024-01-03 ENCOUNTER — Inpatient Hospital Stay

## 2024-01-03 ENCOUNTER — Other Ambulatory Visit (HOSPITAL_COMMUNITY): Payer: Self-pay

## 2024-01-03 ENCOUNTER — Ambulatory Visit (INDEPENDENT_AMBULATORY_CARE_PROVIDER_SITE_OTHER)

## 2024-01-03 ENCOUNTER — Telehealth: Payer: Self-pay

## 2024-01-03 ENCOUNTER — Other Ambulatory Visit

## 2024-01-03 VITALS — BP 115/74 | HR 74 | Temp 98.2°F | Resp 18 | Ht 70.0 in | Wt 370.0 lb

## 2024-01-03 DIAGNOSIS — Z9884 Bariatric surgery status: Secondary | ICD-10-CM | POA: Diagnosis not present

## 2024-01-03 DIAGNOSIS — E538 Deficiency of other specified B group vitamins: Secondary | ICD-10-CM | POA: Diagnosis not present

## 2024-01-03 DIAGNOSIS — D72828 Other elevated white blood cell count: Secondary | ICD-10-CM

## 2024-01-03 DIAGNOSIS — D508 Other iron deficiency anemias: Secondary | ICD-10-CM

## 2024-01-03 LAB — LIPID PANEL
Chol/HDL Ratio: 3.2 ratio (ref 0.0–5.0)
Cholesterol, Total: 124 mg/dL (ref 100–199)
HDL: 39 mg/dL — ABNORMAL LOW (ref >39)
LDL Chol Calc (NIH): 56 mg/dL (ref 0–99)
Triglycerides: 173 mg/dL — ABNORMAL HIGH (ref 0–149)
VLDL Cholesterol Cal: 29 mg/dL (ref 5–40)

## 2024-01-03 LAB — HEPB+HEPC+HIV PANEL
HIV Screen 4th Generation wRfx: NONREACTIVE
Hep B C IgM: NEGATIVE
Hep B Core Total Ab: NEGATIVE
Hep B E Ab: NONREACTIVE
Hep B E Ag: NEGATIVE
Hep B Surface Ab, Qual: NONREACTIVE
Hep C Virus Ab: NONREACTIVE
Hepatitis B Surface Ag: NEGATIVE

## 2024-01-03 LAB — THYROID PANEL WITH TSH
Free Thyroxine Index: 2 (ref 1.2–4.9)
T3 Uptake Ratio: 29 % (ref 24–39)
T4, Total: 7 ug/dL (ref 4.5–12.0)
TSH: 4.44 u[IU]/mL (ref 0.450–4.500)

## 2024-01-03 LAB — PSA, TOTAL AND FREE
PSA, Free Pct: 23.3 %
PSA, Free: 0.07 ng/mL
Prostate Specific Ag, Serum: 0.3 ng/mL (ref 0.0–4.0)

## 2024-01-03 LAB — VITAMIN D 25 HYDROXY (VIT D DEFICIENCY, FRACTURES): Vit D, 25-Hydroxy: 43.5 ng/mL (ref 30.0–100.0)

## 2024-01-03 MED ORDER — MIRTAZAPINE 7.5 MG PO TABS
7.5000 mg | ORAL_TABLET | Freq: Every day | ORAL | 0 refills | Status: DC
  Filled 2024-01-03: qty 30, 30d supply, fill #0

## 2024-01-03 MED ORDER — SODIUM CHLORIDE 0.9 % IV SOLN
510.0000 mg | Freq: Once | INTRAVENOUS | Status: AC
  Administered 2024-01-03: 510 mg via INTRAVENOUS
  Filled 2024-01-03: qty 17

## 2024-01-03 NOTE — Telephone Encounter (Signed)
 Copied from CRM [id number]. Topic: Clinical - Medical Advice >> Jan 02, 2024  4:04 PM Sasha H wrote: Reason for CRM: Pt's wife states they will not be coming back to [facility] and wants to cancel the genetic testing.

## 2024-01-03 NOTE — Progress Notes (Signed)
 Diagnosis: Iron  Deficiency Anemia  Provider:  Praveen Mannam MD  Procedure: IV Infusion  IV Type: Peripheral, IV Location: L Hand  Feraheme (Ferumoxytol), Dose: 510 mg  Infusion Start Time: 1110  Infusion Stop Time: 1134  Post Infusion IV Care: Observation period completed and Peripheral IV Discontinued  Discharge: Condition: Good, Destination: Home . AVS Declined  Performed by:  Maximiano JONELLE Pouch, LPN

## 2024-01-03 NOTE — Telephone Encounter (Signed)
 See other encounter, future appts have already been canceled

## 2024-01-04 ENCOUNTER — Telehealth: Payer: Self-pay

## 2024-01-04 ENCOUNTER — Other Ambulatory Visit: Payer: Self-pay

## 2024-01-04 NOTE — Telephone Encounter (Signed)
 Called pt regarding lab results. Wife answered. States that we lost her husband and all 3 of her kids as patients. States that front desk and nursing was great and very find but the provider was disrespectful. Cody Hampton has cognitive issues and wife is on bed restriction. Typically she will be on the phone during his visits to make sure every thing is addressed and because he has memory issues. Provider would not allow that and apparently would not address concerns.

## 2024-01-07 ENCOUNTER — Ambulatory Visit: Attending: Cardiology | Admitting: Cardiology

## 2024-01-07 ENCOUNTER — Other Ambulatory Visit (HOSPITAL_COMMUNITY): Payer: Self-pay

## 2024-01-07 ENCOUNTER — Encounter: Payer: Self-pay | Admitting: Cardiology

## 2024-01-07 ENCOUNTER — Ambulatory Visit: Admitting: Cardiology

## 2024-01-07 VITALS — BP 129/76 | HR 84 | Ht 70.0 in | Wt 371.0 lb

## 2024-01-07 DIAGNOSIS — I4892 Unspecified atrial flutter: Secondary | ICD-10-CM | POA: Diagnosis not present

## 2024-01-07 DIAGNOSIS — G4733 Obstructive sleep apnea (adult) (pediatric): Secondary | ICD-10-CM

## 2024-01-07 DIAGNOSIS — R0602 Shortness of breath: Secondary | ICD-10-CM

## 2024-01-07 DIAGNOSIS — I4891 Unspecified atrial fibrillation: Secondary | ICD-10-CM | POA: Diagnosis not present

## 2024-01-07 DIAGNOSIS — I10 Essential (primary) hypertension: Secondary | ICD-10-CM

## 2024-01-07 NOTE — Patient Instructions (Signed)
 Medication Instructions:  No changes *If you need a refill on your cardiac medications before your next appointment, please call your pharmacy*  Lab Work: Today we are going to draw a Dig level If you have labs (blood work) drawn today and your tests are completely normal, you will receive your results only by: MyChart Message (if you have MyChart) OR A paper copy in the mail If you have any lab test that is abnormal or we need to change your treatment, we will call you to review the results.  Testing/Procedures: Your physician has requested that you have an echocardiogram. Echocardiography is a painless test that uses sound waves to create images of your heart. It provides your doctor with information about the size and shape of your heart and how well your heart's chambers and valves are working. This procedure takes approximately one hour. There are no restrictions for this procedure. Please do NOT wear cologne, perfume, aftershave, or lotions (deodorant is allowed). Please arrive 15 minutes prior to your appointment time.  Please note: We ask at that you not bring children with you during ultrasound (echo/ vascular) testing. Due to room size and safety concerns, children are not allowed in the ultrasound rooms during exams. Our front office staff cannot provide observation of children in our lobby area while testing is being conducted. An adult accompanying a patient to their appointment will only be allowed in the ultrasound room at the discretion of the ultrasound technician under special circumstances. We apologize for any inconvenience.  Follow-Up: At [facility], you and your health needs are our priority.  As part of our continuing mission to provide you with exceptional heart care, our providers are all part of one team.  This team includes your primary Cardiologist (physician) and Advanced Practice Providers or APPs (Physician Assistants and Nurse Practitioners) who all work  together to provide you with the care you need, when you need it.  Your next appointment:   3 month(s)  Provider:   Gayatri A Acharya, MD    We recommend signing up for the patient portal called MyChart.  Sign up information is provided on this After Visit Summary.  MyChart is used to connect with patients for Virtual Visits (Telemedicine).  Patients are able to view lab/test results, encounter notes, upcoming appointments, etc.  Non-urgent messages can be sent to your provider as well.   To learn more about what you can do with MyChart, go to [email or web address].

## 2024-01-08 ENCOUNTER — Ambulatory Visit: Payer: Self-pay | Admitting: Cardiology

## 2024-01-08 ENCOUNTER — Ambulatory Visit: Admitting: Cardiology

## 2024-01-08 LAB — DIGOXIN LEVEL: Digoxin, Serum: 0.8 ng/mL (ref 0.5–0.9)

## 2024-01-09 ENCOUNTER — Other Ambulatory Visit: Payer: Self-pay

## 2024-01-09 ENCOUNTER — Other Ambulatory Visit (HOSPITAL_COMMUNITY): Payer: Self-pay

## 2024-01-09 MED ORDER — TESTOSTERONE CYPIONATE 200 MG/ML IM SOLN
200.0000 mg | INTRAMUSCULAR | 2 refills | Status: DC
  Filled 2024-01-09: qty 2, 28d supply, fill #0
  Filled 2024-01-21 – 2024-02-07 (×2): qty 2, 28d supply, fill #1
  Filled 2024-03-10: qty 2, 28d supply, fill #2

## 2024-01-10 LAB — BCR-ABL1 FISH
Cells Analyzed: 200
Cells Counted: 200

## 2024-01-11 ENCOUNTER — Encounter: Payer: Self-pay | Admitting: Internal Medicine

## 2024-01-13 ENCOUNTER — Encounter: Payer: Self-pay | Admitting: Internal Medicine

## 2024-01-14 ENCOUNTER — Other Ambulatory Visit (HOSPITAL_COMMUNITY): Payer: Self-pay

## 2024-01-14 ENCOUNTER — Telehealth: Payer: Self-pay | Admitting: Internal Medicine

## 2024-01-14 ENCOUNTER — Other Ambulatory Visit: Payer: Self-pay

## 2024-01-14 ENCOUNTER — Ambulatory Visit: Payer: Self-pay | Admitting: Physician Assistant

## 2024-01-14 LAB — CALR +MPL + E12-E15  (REFLEX)

## 2024-01-14 LAB — JAK2 V617F RFX CALR/MPL/E12-15

## 2024-01-14 MED ORDER — MIRTAZAPINE 15 MG PO TABS
15.0000 mg | ORAL_TABLET | Freq: Every day | ORAL | 0 refills | Status: DC
  Filled 2024-01-14: qty 30, 30d supply, fill #0

## 2024-01-14 NOTE — Telephone Encounter (Signed)
 Patient's spouse reports that the other day the patient's diastolic BP went up to 140. He rechecked it about 10 minutes later and it was 120. She states that after another 10 minutes it came down to normal. She does not have a record of his systolic BP readings. Advised to have him to continue monitoring his BP and call back if BP continues to be elevated. Aware of ER precautions for sustained readings >200/100.

## 2024-01-14 NOTE — Telephone Encounter (Signed)
  Pt c/o BP issue: STAT if pt c/o blurred vision, one-sided weakness or slurred speech.  STAT if BP is GREATER than 180/120 TODAY.  STAT if BP is LESS than 90/60 and SYMPTOMATIC TODAY  1. What is your BP concern?  Pt spouse called in stating one of is doctors changed his depression to Mirtzapine and his bottom number was high yesterday. She asked if this med could have caused it     mirtazapine  (REMERON ) 7.5 MG tablet    2. Have you taken any BP medication today? No, pt is still sleep   3. What are your last 5 BP readings?  She stated yesterday  bp bottom number was 149 then he waited about 10 minutes and took it again it was 125. She does not have the exact bp readings or the hr.   4. Are you having any other symptoms (ex. Dizziness, headache, blurred vision, passed out)? Dizziness yesterday

## 2024-01-15 ENCOUNTER — Ambulatory Visit: Admitting: *Deleted

## 2024-01-15 VITALS — BP 136/70 | HR 81 | Temp 98.7°F | Resp 16 | Ht 70.0 in | Wt 377.4 lb

## 2024-01-15 DIAGNOSIS — Z9884 Bariatric surgery status: Secondary | ICD-10-CM

## 2024-01-15 DIAGNOSIS — D508 Other iron deficiency anemias: Secondary | ICD-10-CM

## 2024-01-15 MED ORDER — SODIUM CHLORIDE 0.9 % IV SOLN
510.0000 mg | Freq: Once | INTRAVENOUS | Status: AC
  Administered 2024-01-15: 510 mg via INTRAVENOUS
  Filled 2024-01-15: qty 17

## 2024-01-15 NOTE — Telephone Encounter (Signed)
 Loni Soyla LABOR, MD to [facility] Triage  Cv Div Pharmd  Zara Buel BROCKS, RN (Selected Message)     01/14/24  7:06 PM He follows closely with afib clinic, he should address this with them. Thanks

## 2024-01-15 NOTE — Progress Notes (Signed)
 Diagnosis: Iron  Deficiency Anemia  Provider:  Mannam, Praveen MD  Procedure: IV Infusion  IV Type: Peripheral, IV Location: L Forearm  Feraheme  (Ferumoxytol ), Dose: 510 mg  Infusion Start Time: 0927 am  Infusion Stop Time: 0950 am  Post Infusion IV Care: Observation period completed and Peripheral IV Discontinued  Discharge: Condition: Good, Destination: Home . AVS Provided  Performed by:  Trudy Lamarr LABOR, RN

## 2024-01-15 NOTE — Addendum Note (Signed)
 Addended by: RANNIE LEITA BRAVO on: 01/15/2024 03:16 PM   Modules accepted: Orders

## 2024-01-18 NOTE — Telephone Encounter (Signed)
 It appears patient has already established with another practice.

## 2024-01-18 NOTE — Telephone Encounter (Signed)
 Unfortunately I am just seeing this message and it appears patient has already established with another practice.

## 2024-01-21 ENCOUNTER — Encounter (HOSPITAL_COMMUNITY): Payer: Self-pay

## 2024-01-21 ENCOUNTER — Other Ambulatory Visit (HOSPITAL_COMMUNITY): Payer: Self-pay

## 2024-01-22 ENCOUNTER — Other Ambulatory Visit (HOSPITAL_COMMUNITY): Payer: Self-pay

## 2024-01-23 ENCOUNTER — Other Ambulatory Visit: Payer: Self-pay

## 2024-01-23 ENCOUNTER — Other Ambulatory Visit (HOSPITAL_COMMUNITY): Payer: Self-pay

## 2024-01-23 MED ORDER — ONETOUCH VERIO VI STRP
ORAL_STRIP | Freq: Every day | 5 refills | Status: DC
  Filled 2024-01-23: qty 100, 100d supply, fill #0
  Filled 2024-04-28 – 2024-06-01 (×3): qty 100, 100d supply, fill #1

## 2024-01-23 MED ORDER — ONETOUCH VERIO FLEX SYSTEM W/DEVICE KIT
PACK | 0 refills | Status: DC
  Filled 2024-01-23: qty 1, 100d supply, fill #0

## 2024-01-23 MED ORDER — ONETOUCH DELICA PLUS LANCET33G MISC
1.0000 | Freq: Every day | 2 refills | Status: DC
  Filled 2024-01-23: qty 100, 100d supply, fill #0

## 2024-01-23 MED ORDER — ONETOUCH VERIO VI STRP
ORAL_STRIP | Freq: Every day | 5 refills | Status: DC
  Filled 2024-01-23: qty 100, 100d supply, fill #0

## 2024-01-25 ENCOUNTER — Other Ambulatory Visit: Payer: Self-pay

## 2024-01-25 ENCOUNTER — Other Ambulatory Visit (HOSPITAL_COMMUNITY): Payer: Self-pay

## 2024-01-25 ENCOUNTER — Encounter (HOSPITAL_COMMUNITY): Payer: Self-pay

## 2024-01-25 ENCOUNTER — Other Ambulatory Visit: Payer: Self-pay | Admitting: Physician Assistant

## 2024-01-25 MED ORDER — TRAMADOL HCL 50 MG PO TABS
50.0000 mg | ORAL_TABLET | Freq: Three times a day (TID) | ORAL | 2 refills | Status: DC | PRN
  Filled 2024-01-25: qty 90, 30d supply, fill #0
  Filled 2024-02-23: qty 90, 30d supply, fill #1

## 2024-01-26 ENCOUNTER — Other Ambulatory Visit (HOSPITAL_COMMUNITY): Payer: Self-pay

## 2024-01-27 ENCOUNTER — Encounter: Payer: Self-pay | Admitting: Internal Medicine

## 2024-01-28 ENCOUNTER — Emergency Department (HOSPITAL_COMMUNITY)
Admission: EM | Admit: 2024-01-28 | Discharge: 2024-01-28 | Disposition: A | Discharge status: Home / Self Care | Source: Ambulatory Visit | Attending: Emergency Medicine | Admitting: Emergency Medicine

## 2024-01-28 ENCOUNTER — Telehealth: Payer: Self-pay | Admitting: Neurology

## 2024-01-28 ENCOUNTER — Other Ambulatory Visit: Payer: Self-pay

## 2024-01-28 ENCOUNTER — Encounter (HOSPITAL_COMMUNITY): Payer: Self-pay

## 2024-01-28 ENCOUNTER — Emergency Department (HOSPITAL_COMMUNITY)

## 2024-01-28 ENCOUNTER — Other Ambulatory Visit (HOSPITAL_COMMUNITY): Payer: Self-pay

## 2024-01-28 DIAGNOSIS — R0789 Other chest pain: Secondary | ICD-10-CM | POA: Insufficient documentation

## 2024-01-28 DIAGNOSIS — D72829 Elevated white blood cell count, unspecified: Secondary | ICD-10-CM | POA: Diagnosis not present

## 2024-01-28 DIAGNOSIS — M25562 Pain in left knee: Secondary | ICD-10-CM | POA: Diagnosis not present

## 2024-01-28 DIAGNOSIS — I444 Left anterior fascicular block: Secondary | ICD-10-CM | POA: Insufficient documentation

## 2024-01-28 DIAGNOSIS — M25561 Pain in right knee: Secondary | ICD-10-CM | POA: Diagnosis present

## 2024-01-28 DIAGNOSIS — Z79899 Other long term (current) drug therapy: Secondary | ICD-10-CM | POA: Insufficient documentation

## 2024-01-28 DIAGNOSIS — G8929 Other chronic pain: Secondary | ICD-10-CM | POA: Insufficient documentation

## 2024-01-28 LAB — BASIC METABOLIC PANEL WITH GFR
Anion gap: 10 (ref 5–15)
BUN: 8 mg/dL (ref 8–23)
CO2: 20 mmol/L — ABNORMAL LOW (ref 22–32)
Calcium: 8.3 mg/dL — ABNORMAL LOW (ref 8.9–10.3)
Chloride: 104 mmol/L (ref 98–111)
Creatinine, Ser: 1 mg/dL (ref 0.61–1.24)
GFR, Estimated: >60 mL/min (ref >60)
Glucose, Bld: 160 mg/dL — ABNORMAL HIGH (ref 70–99)
Potassium: 4.1 mmol/L (ref 3.5–5.1)
Sodium: 134 mmol/L — ABNORMAL LOW (ref 135–145)

## 2024-01-28 LAB — CBC
HCT: 41.8 % (ref 39.0–52.0)
Hemoglobin: 12.4 g/dL — ABNORMAL LOW (ref 13.0–17.0)
MCH: 24.1 pg — ABNORMAL LOW (ref 26.0–34.0)
MCHC: 29.7 g/dL — ABNORMAL LOW (ref 30.0–36.0)
MCV: 81.2 fL (ref 80.0–100.0)
Platelets: 529 K/uL — ABNORMAL HIGH (ref 150–400)
RBC: 5.15 MIL/uL (ref 4.22–5.81)
RDW: 25.3 % — ABNORMAL HIGH (ref 11.5–15.5)
WBC: 19.7 K/uL — ABNORMAL HIGH (ref 4.0–10.5)
nRBC: 0 % (ref 0.0–0.2)

## 2024-01-28 LAB — TROPONIN I (HIGH SENSITIVITY)
Troponin I (High Sensitivity): 6 ng/L (ref <18)
Troponin I (High Sensitivity): 7 ng/L (ref <18)

## 2024-01-28 LAB — DIGOXIN LEVEL: Digoxin Level: 1 ng/mL (ref 0.8–2.0)

## 2024-01-28 MED ORDER — OXYCODONE-ACETAMINOPHEN 5-325 MG PO TABS
1.0000 | ORAL_TABLET | Freq: Two times a day (BID) | ORAL | 0 refills | Status: DC | PRN
  Filled 2024-01-28: qty 45, 23d supply, fill #0

## 2024-01-28 NOTE — ED Notes (Signed)
 Pt's O2 dropped to 83% when lying down, pt's HOB lifted and 2L [place] placed on the pt.

## 2024-01-28 NOTE — ED Provider Notes (Signed)
 [place] EMERGENCY DEPARTMENT AT [facility] Provider Note   CSN: [id number] Arrival date & time: 01/28/24  1035     Patient presents with: Chest Pain   Cody Hampton is a 63 y.o. male.   63 year old male presents with sudden onset of chest discomfort which occurred while he was moving his knees today at his doctor's office.  Patient states that he has had issue with chronic pain to his knees.  States that he has had no anginal type symptoms with exertion.  Notes that his knee pain he takes tramadol  as well as oxycodone .  States that as long as does move his knees he has no symptoms.  Today at his doctor's office he moved around and had some nonradiating chest discomfort which got better when he remains still.  No other symptoms recently       Prior to Admission medications   Medication Sig Start Date End Date Taking? Authorizing Provider  Blood Glucose Monitoring Suppl (ONETOUCH VERIO FLEX SYSTEM) w/Device KIT use to check blood sugars once daily 01/23/24     CALCIUM  PO Take 1 tablet by mouth daily.    [provider]  citalopram  (CELEXA ) 20 MG tablet Take 1 tablet (20 mg total) by mouth 2 (two) times daily. 10/10/23   Amin, Ankit C, MD  diclofenac  Sodium (VOLTAREN  ARTHRITIS PAIN) 1 % GEL Apply 2 g topically 4 (four) times daily as needed (pain).    [provider]  digoxin  (LANOXIN ) 0.25 MG tablet Take 1 tablet (0.25 mg total) by mouth daily. 11/16/23   Terra Fairy PARAS, PA-C  diltiazem  (CARDIZEM ) 120 MG tablet Take 2 tablets (240 mg total) by mouth 2 (two) times daily. 11/26/23   Terra Fairy PARAS, PA-C  glucose blood [facility] VERIO) test strip use to check blood sugar once daily DX E11.9 01/22/24     glucose blood (ONETOUCH VERIO) test strip use to check blood sugar once daily DX E11.9 01/23/24     Lancets (ONETOUCH DELICA PLUS LANCET33G) MISC Use to check blood sugar once daily DX E11.9 01/22/24     lisinopril  (ZESTRIL ) 10 MG tablet Take 1 tablet (10 mg  total) by mouth daily. 12/10/23   Terra Fairy PARAS, PA-C  loperamide  (IMODIUM  A-D) 2 MG tablet Take 4 mg by mouth daily.    [provider]  mirtazapine  (REMERON ) 15 MG tablet Take 1 tablet (15 mg total) by mouth at bedtime. 01/14/24     mirtazapine  (REMERON ) 7.5 MG tablet Take 1 tablet (7.5 mg total) by mouth at bedtime. Patient not taking: Reported on 01/07/2024 01/03/24     Multiple Vitamins-Minerals (BARIATRIC MULTIVITAMINS/IRON ) CAPS Take 1 capsule by mouth daily with breakfast.    [provider]  oxyCODONE -acetaminophen  (PERCOCET/ROXICET) 5-325 MG tablet Take 1 tablet by mouth 2 (two) times a day as needed for moderate pain (4-6) or severe pain (7-10). 01/28/24     pantoprazole  (PROTONIX ) 40 MG tablet Take 1 tablet (40 mg total) by mouth daily. 10/31/23   Krishnan, Gokul, MD  polycarbophil (FIBERCON) 625 MG tablet Take 625 mg by mouth daily.    [provider]  polyethylene glycol powder (GLYCOLAX /MIRALAX ) 17 GM/SCOOP powder Take 17 g by mouth daily. 10/31/23   Krishnan, Gokul, MD  testosterone  cypionate (DEPOTESTOSTERONE CYPIONATE) 200 MG/ML injection Inject into the muscle every 14 (fourteen) days. 09/14/23   [provider]  testosterone  cypionate (DEPOTESTOSTERONE CYPIONATE) 200 MG/ML injection Inject 1 mL (200 mg total) into the muscle every 14 (fourteen) days.  01/08/24     traMADol  (ULTRAM ) 50 MG tablet Take 1 tablet (50 mg total) by mouth every 6 (six) hours as needed. 12/26/23   Thayil, Irene T, PA-C  traMADol  (ULTRAM ) 50 MG tablet Take 1 tablet (50 mg total) by mouth every 8 (eight) hours as needed for moderate pain (4-6) or severe pain (7-10). 01/25/24       Allergies: Metoprolol , Duloxetine, Gabapentin, and Hydrocodone    Review of Systems  All other systems reviewed and are negative.   Updated Vital Signs BP (!) 155/66 (BP Location: Left Arm)   Pulse 96   Temp 98.1 F (36.7 C)   Resp 19   Ht 1.778 m (5' 10)   Wt (!) 171 kg   SpO2 96%   BMI 54.09  kg/m   Physical Exam Vitals and nursing note reviewed.  Constitutional:      General: He is not in acute distress.    Appearance: Normal appearance. He is well-developed. He is not toxic-appearing.  HENT:     Head: Normocephalic and atraumatic.  Eyes:     General: Lids are normal.     Conjunctiva/sclera: Conjunctivae normal.     Pupils: Pupils are equal, round, and reactive to light.  Neck:     Thyroid : No thyroid  mass.     Trachea: No tracheal deviation.  Cardiovascular:     Rate and Rhythm: Normal rate and regular rhythm.     Heart sounds: Normal heart sounds. No murmur heard.    No gallop.  Pulmonary:     Effort: Pulmonary effort is normal. No respiratory distress.     Breath sounds: Normal breath sounds. No stridor. No decreased breath sounds, wheezing, rhonchi or rales.  Abdominal:     General: There is no distension.     Palpations: Abdomen is soft.     Tenderness: There is no abdominal tenderness. There is no rebound.  Musculoskeletal:        General: No tenderness. Normal range of motion.     Cervical back: Normal range of motion and neck supple.  Skin:    General: Skin is warm and dry.     Findings: No abrasion or rash.  Neurological:     Mental Status: He is alert and oriented to person, place, and time. Mental status is at baseline.     GCS: GCS eye subscore is 4. GCS verbal subscore is 5. GCS motor subscore is 6.     Cranial Nerves: No cranial nerve deficit.     Sensory: No sensory deficit.     Motor: Motor function is intact.  Psychiatric:        Attention and Perception: Attention normal.        Speech: Speech normal.        Behavior: Behavior normal.     (all labs ordered are listed, but only abnormal results are displayed) Labs Reviewed  CBC - Abnormal; Notable for the following components:      Result Value   WBC 19.7 (*)    Hemoglobin 12.4 (*)    MCH 24.1 (*)    MCHC 29.7 (*)    RDW 25.3 (*)    Platelets 529 (*)    All other components within  normal limits  BASIC METABOLIC PANEL WITH GFR  TROPONIN I (HIGH SENSITIVITY)  TROPONIN I (HIGH SENSITIVITY)    EKG: EKG Interpretation Date/Time:  Monday January 28 2024 10:47:17 EDT Ventricular Rate:  98 PR Interval:  166 QRS Duration:  94  QT Interval:  336 QTC Calculation: 428 R Axis:   -60  Text Interpretation: Normal sinus rhythm Left anterior fascicular block Possible Lateral infarct , age undetermined Abnormal ECG When compared with ECG of 16-Nov-2023 11:16, PREVIOUS ECG IS PRESENT No significant change since last tracing Confirmed by Dasie Faden ([id number]) on 01/28/2024 12:05:48 PM  Radiology: [facility] Knee Complete 4 Views Left Result Date: 01/28/2024 CLINICAL DATA:  Knee pain. EXAM: LEFT KNEE - COMPLETE 4+ VIEW COMPARISON:  Radiographs 12/20/2012. FINDINGS: Lateral view is limited by rotation. The bones appear adequately mineralized. There are progressive tricompartmental degenerative changes, most advanced in the lateral compartment. No evidence of acute fracture, dislocation or large joint effusion. There are soft tissue calcifications within the lower leg, possibly secondary to chronic venous stasis or connective tissue disorder. IMPRESSION: Progressive tricompartmental degenerative changes, most advanced in the lateral compartment. No acute osseous findings. Electronically Signed   By: Elsie Perone M.D.   On: 01/28/2024 12:15   DG Chest 2 View Result Date: 01/28/2024 CLINICAL DATA:  Chest pain. EXAM: CHEST - 2 VIEW COMPARISON:  Chest CT dated 06/26/2023. FINDINGS: Mild eventration of the left hemidiaphragm. No focal consolidation, pleural effusion or pneumothorax. The cardiac silhouette is within normal limits. No acute osseous pathology. IMPRESSION: No active cardiopulmonary disease. Electronically Signed   By: Vanetta Chou M.D.   On: 01/28/2024 12:07     Procedures   Medications Ordered in the ED - No data to display                                  Medical Decision  Making Amount and/or Complexity of Data Reviewed Labs: ordered. Radiology: ordered.   Patient is EKG shows normal sinus rhythm.  Bilateral knee x-ray is negative for acute process.  Does show osteoarthritis.  Chest x-ray without acute findings.  Troponin is negative here. Mild leukocytosis at 19.7 thousand.  Unclear etiology.  He is afebrile here.  Patient states he was only there today for routine checkup and that he has been having increasing pain to his knees.  States that as long as he is still he is fine.  Currently taking Percocet and tramadol .  I have informed him and the wife by phone that he went to follow-up with his primary care doctor for pain management.    Final diagnoses:  None    ED Discharge Orders     None          Dasie Faden, MD 01/28/24 1544

## 2024-01-28 NOTE — Telephone Encounter (Signed)
 Pt wife called to cancel appt due to Pt feel and have to go to ER.   Appt Cancel

## 2024-01-28 NOTE — ED Notes (Addendum)
 Pt is stable now on RA and HOB raised. O2 96%

## 2024-01-28 NOTE — ED Triage Notes (Signed)
 C/o chest pain with elevated heart rate and decreased blood pressure coming from DR. Office, states he went for routine lab work,

## 2024-01-28 NOTE — Discharge Instructions (Signed)
 Please call your doctor today to schedule a follow-up visit for this week for management of your chronic knee pain

## 2024-01-29 ENCOUNTER — Institutional Professional Consult (permissible substitution): Admitting: Professional Counselor

## 2024-01-29 ENCOUNTER — Encounter: Payer: Self-pay | Admitting: *Deleted

## 2024-01-29 ENCOUNTER — Other Ambulatory Visit (HOSPITAL_COMMUNITY): Payer: Self-pay

## 2024-01-30 ENCOUNTER — Other Ambulatory Visit: Payer: Self-pay

## 2024-01-30 ENCOUNTER — Inpatient Hospital Stay (HOSPITAL_COMMUNITY)
Admission: RE | Admit: 2024-01-30 | Discharge: 2024-01-30 | Disposition: A | Discharge status: Home / Self Care | Source: Ambulatory Visit | Attending: Physician Assistant

## 2024-01-30 ENCOUNTER — Other Ambulatory Visit (HOSPITAL_COMMUNITY): Payer: Self-pay

## 2024-01-30 ENCOUNTER — Telehealth: Payer: Self-pay | Admitting: Internal Medicine

## 2024-01-30 ENCOUNTER — Telehealth (HOSPITAL_COMMUNITY): Payer: Self-pay | Admitting: *Deleted

## 2024-01-30 ENCOUNTER — Institutional Professional Consult (permissible substitution): Payer: Self-pay | Admitting: Neurology

## 2024-01-30 DIAGNOSIS — R000 Tachycardia, unspecified: Secondary | ICD-10-CM

## 2024-01-30 NOTE — Telephone Encounter (Signed)
 Discuss with patient wife to f/u with cardiology. Wife was advise that Dr Lazoff is not treating patient for his anxiety/depression patient needs to follow up with his psyh and follow the plan they have for treatment of the patient.

## 2024-01-30 NOTE — Telephone Encounter (Signed)
 Tried to call [phone], number left for both calls from pt and wife. No answer, left vm msg asking for call back.

## 2024-01-30 NOTE — Telephone Encounter (Signed)
 Called and spoke to Edgar. Pt is on multiple meds for severe arthritis and also on psych meds. Remeron  recently doubled to 15 mg once daily. He was at PCP's office (for blood work) on 01/28/24 and HR found to be 159, BP around 150/45, he felt dizzy/lightheaded and in pain. They advised him to go to ED. Verneita states there was no cardiac work up done at ED, only an EKG that looked fine.   HR usually 100-120, spikes up to 150's with activity. BP usually 155/80's. He can not sleep, continues to feel lightheaded and dizzy. Mentioned oxygen  level was low over the weekend but currently in mid 90's. Also mentioned that someone told her in the ED that he may eventually need home oxygen .   Verneita feels that that some medications may be the culprit and is not sure about the psych meds interacting with cardiac meds.

## 2024-01-30 NOTE — Telephone Encounter (Signed)
 Received call from wife reporting husband (patient)  had to go to ER for pain to knees and elevated HR. She had many questions about what meds may need to be changed. Pt's wife states his HR elevates to 130's with activity but recovers with rest wife concerned it is Afib. Spoke with [employer] he recommends pt to wear heart monitor for 2 weeks for him to see any arrhythmias since pt is sensitive to med changes. Wife agrees to plan. I will send her a monitor in the mail.

## 2024-01-30 NOTE — Telephone Encounter (Signed)
 Patient identification verified by 2 forms.   Called and spoke to patient's wife.   Patient's wife states:  -Pt's wife states the pt needs to go back on his  psych meds. -If he is resting he is fine, when he is moving around his heart rate spikes.  -Wife states pt is taking his medications as prescribed.  -Pt has been having this issue when starting mirtazapine . -Wife states pt was on Celexa  and it was working greatly.  -The psychiatrist will not prescribe Celexa  because of the interaction with Cardizem .    -Wife states she would like some medications changed in order to get the pt back on Celexa . -Pt has been on Remeron  for 2 months and that's when the issues started.    Patient denies:  -Being able to get the medication prescribed by another provider              Interventions/Plan: -Appt made for tomorrow -Made wife aware psych meds  can bot be prescribed at this office.    Patient agrees with plan, no questions at this time

## 2024-01-30 NOTE — Telephone Encounter (Signed)
  Wife is calling back to ask if Dr Loni will prescribe something for his depression that will be okay to take with his heart medication. She states the therapist said he won't prescribe him something due to the heart meds he is on. She would like to discuss this

## 2024-01-30 NOTE — Telephone Encounter (Signed)
 Pt c/o medication issue:  1. Name of Medication:   mirtazapine  (REMERON ) 15 MG tablet   2. How are you currently taking this medication (dosage and times per day)?   As prescribed  3. Are you having a reaction (difficulty breathing--STAT)?   4. What is your medication issue?   Wife Thomasine) stated patient started on this medication and has had increased HR readings and low BP readings.  Wife noted patient is also taking digoxin  (LANOXIN ) 0.25 MG tablet, traMADol  (ULTRAM ) 50 MG tablet  and diltiazem  (CARDIZEM ) 120 MG tablet and is concerned about next steps.

## 2024-01-30 NOTE — Telephone Encounter (Signed)
 Pt returning call

## 2024-01-31 ENCOUNTER — Ambulatory Visit: Attending: Cardiology | Admitting: Cardiology

## 2024-01-31 ENCOUNTER — Other Ambulatory Visit (HOSPITAL_COMMUNITY): Payer: Self-pay

## 2024-01-31 VITALS — BP 106/60 | HR 91 | Ht 70.0 in | Wt 370.0 lb

## 2024-01-31 DIAGNOSIS — G4733 Obstructive sleep apnea (adult) (pediatric): Secondary | ICD-10-CM | POA: Diagnosis not present

## 2024-01-31 DIAGNOSIS — R0602 Shortness of breath: Secondary | ICD-10-CM

## 2024-01-31 DIAGNOSIS — I10 Essential (primary) hypertension: Secondary | ICD-10-CM | POA: Diagnosis not present

## 2024-01-31 DIAGNOSIS — I4892 Unspecified atrial flutter: Secondary | ICD-10-CM

## 2024-01-31 DIAGNOSIS — K922 Gastrointestinal hemorrhage, unspecified: Secondary | ICD-10-CM

## 2024-01-31 NOTE — Progress Notes (Signed)
 Cardiology Office Note:   Date:  01/31/2024  ID:  Cody Hampton, DOB 1961-05-22, MRN [id number] PCP: Lazoff, Shawn P, DO  [facility] Providers Cardiologist:  Soyla DELENA Merck, MD Cardiology APP:  Terra Fairy PARAS, PA-C    History of Present Illness:   Discussed the use of AI scribe software for clinical note transcription with the patient, who gave verbal consent to proceed.  History of Present Illness Cody Hampton is a 63 year old male with atrial flutter, HTN, obesity, OSA, history of gastric bypass surgery who presents with medication management concerns. Patient's spouse participated in visit via phone.   He has a history of atrial fibrillation and was seen in the emergency department in December 2024 with new onset atrial flutter with rapid ventricular response. He underwent successful cardioversion but later returned to atrial fibrillation with a controlled ventricular response. He was started on Lopressor  and Eliquis  and established care with the AFib clinic later that month.  In January 2025, he wore a cardiac monitor which showed no atrial fibrillation, occasional premature atrial contractions (PACs) with a 3.5% burden, rare premature ventricular contractions (PVCs), and no other sustained arrhythmias. An echocardiogram showed an ejection fraction of 60-65%.  He stopped taking Eliquis  earlier this year, believing it affected his chemistry, and was seen in the emergency department in March 2025 with atrial flutter with rapid ventricular response. He was restarted on Eliquis  and transitioned to bisoprolol  due to fatigue attributed to metoprolol . He was later readmitted on March 29 with a gastrointestinal bleed, underwent endoscopy without source of bleeding, and was restarted on Eliquis . Bisoprolol  was stopped and he was moved to diltiazem .  In April 2025, he was noted to be back in atrial flutter with rapid ventricular response and consented for TEE cardioversion. He  presented to the hospital with black stools and weakness, with a hemoglobin of 8.8. Eliquis  was held, and he was treated with ibuprofen and digoxin . Another EGD showed no source of bleeding. He was restarted on Eliquis  with stable hemoglobin and had TEE-guided cardioversion on April 21, successfully restoring normal sinus rhythm. He was discharged on digoxin , diltiazem , and Eliquis . Patient's Eliquis  was stopped per afib clinic 4 weeks after his latest cardioversion with CHA2DS2-VASc score felt to be 1.    Recently, he was sent to the emergency department by orthopedist after reporting that his heart was pounding tight. He also reported feeling unwell at that time. An EKG did not show recurrence of atrial flutter. Cardiac workup in the ED reassuring. Since this ED visit, he has been monitoring his blood pressure at home, reporting values typically around 150/62 mmHg.  He has been experiencing significant knee pain, rated as 'eleven or twelve' on a pain scale, which he believes may have been exacerbated by a previous cardioversion. He is also dealing with depression, which he feels is not well-managed with his current medication, mirtazapine , and wishes to return to Celexa , which he previously found effective. However, his psychiatrist per patient has refused to prescribe it, citing concerns about interactions with his cardiac medications.  He reports a fall on Monday prior to his recent hospital visit, resulting in increased pain in his knees and shoulder. He has a history of obstructive sleep apnea and obesity, and he underwent gastric bypass surgery in the past.  Despite above concerns, patient overall feels like his much better from a cardiac perspective and admits to improvement in breathing from reported dyspnea at last cardiology office visit. He expresses a reluctance  to modify cardiac regimen with stable HR and rhythm.   Studies Reviewed:    EKG:       10/29/23 TEE  IMPRESSIONS     1.  Left ventricular ejection fraction, by estimation, is 55 to 60%. The  left ventricle has normal function.   2. Right ventricular systolic function is mildly reduced. The right  ventricular size is normal.   3. Left atrial size was mildly dilated. No left atrial/left atrial  appendage thrombus was detected. The LAA emptying velocity was 67 cm/s.   4. Right atrial size was mildly dilated.   5. The mitral valve is normal in structure. Trivial mitral valve  regurgitation. No evidence of mitral stenosis.   6. The aortic valve is tricuspid. Aortic valve regurgitation is not  visualized. No aortic stenosis is present.   7. 3D performed of the LAA and demonstrates normal LAA with no thrombus.   Conclusion(s)/Recommendation(s): No LA/LAA thrombus identified. Successful  cardioversion performed with restoration of normal sinus rhythm.   FINDINGS   Left Ventricle: Left ventricular ejection fraction, by estimation, is 55  to 60%. The left ventricle has normal function. The left ventricular  internal cavity size was normal in size.   Right Ventricle: The right ventricular size is normal. No increase in  right ventricular wall thickness. Right ventricular systolic function is  mildly reduced.   Left Atrium: Left atrial size was mildly dilated. No left atrial/left  atrial appendage thrombus was detected. The LAA emptying velocity was 67  cm/s.   Right Atrium: Right atrial size was mildly dilated.   Pericardium: Trivial pericardial effusion is present.   Mitral Valve: The mitral valve is normal in structure. Trivial mitral  valve regurgitation. No evidence of mitral valve stenosis.   Tricuspid Valve: The tricuspid valve is grossly normal. Tricuspid valve  regurgitation is not demonstrated. No evidence of tricuspid stenosis.   Aortic Valve: The aortic valve is tricuspid. Aortic valve regurgitation is  not visualized. No aortic stenosis is present.   Pulmonic Valve: The pulmonic valve was  normal in structure. Pulmonic valve  regurgitation is trivial. No evidence of pulmonic stenosis.   Aorta: The aortic root and ascending aorta are structurally normal, with  no evidence of dilitation.   IAS/Shunts: No atrial level shunt detected by color flow Doppler.   Additional Comments: 3D was performed not requiring image post processing  on an independent workstation and was normal. Spectral Doppler performed.   Risk Assessment/Calculations:    CHA2DS2-VASc Score = 1   This indicates a 0.6% annual risk of stroke. The patient's score is based upon: CHF History: 0 HTN History: 1 Diabetes History: 0 Stroke History: 0 Vascular Disease History: 0 Age Score: 0 Gender Score: 0              Physical Exam:   VS:  BP 106/60   Pulse 91   Ht 5' 10 (1.778 m)   Wt (!) 370 lb (167.8 kg)   SpO2 93%   BMI 53.09 kg/m    Wt Readings from Last 3 Encounters:  01/31/24 (!) 370 lb (167.8 kg)  01/28/24 (!) 376 lb 15.8 oz (171 kg)  01/15/24 (!) 377 lb 6.4 oz (171.2 kg)     Physical Exam Vitals reviewed.  Constitutional:      Appearance: Normal appearance. He is obese.     Comments: Sitting in wheelchair  HENT:     Head: Normocephalic.  Eyes:     Pupils: Pupils are equal, round,  and reactive to light.  Cardiovascular:     Rate and Rhythm: Normal rate and regular rhythm.     Pulses: Normal pulses.     Heart sounds: Normal heart sounds.  Pulmonary:     Effort: Pulmonary effort is normal.     Breath sounds: Normal breath sounds.  Abdominal:     General: Abdomen is flat.     Palpations: Abdomen is soft.  Musculoskeletal:     Right lower leg: No edema.     Left lower leg: No edema.  Skin:    General: Skin is warm and dry.     Capillary Refill: Capillary refill takes less than 2 seconds.  Neurological:     General: No focal deficit present.     Mental Status: He is alert and oriented to person, place, and time.  Psychiatric:        Mood and Affect: Mood normal.         Behavior: Behavior normal.        Thought Content: Thought content normal.        Judgment: Judgment normal.     ASSESSMENT AND PLAN:    Assessment & Plan Atrial flutter Recurrent atrial flutter with rapid ventricular response. Recent emergency department visit with elevated heart rate of 129 bpm reportedly noted at his orthopedist. No recurrence on ED EKG. Current management includes diltiazem  and digoxin . Eliquis  discontinued by afib clinic with CHA2DS2-VASc Score = 1. Concerns about potential interactions with psychiatric medications. - Given stability of atrial flutter, discussed with patient and spouse significant hesitancy to change his current medications. Limited options with prior intolerance to 2 beta blockers. Continue diltiazem  and digoxin . - Follow up echocardiogram and cardiac monitor results  Gastrointestinal bleeding Recurrent GI bleeds. Recent endoscopies did not reveal a source. Black stools and hemoglobin of 8.8 reported on October 30, 2023. Currently CHA2DS2-VASc Score = 1 but will increase to 2 this September. At that time will need to weight risks vs benefits of OAC.  Hypertension Blood pressure is well-managed with current medications. Home reading as high as 150/62 mmHg but well controlled in clinic today. - Continue current antihypertensive regimen - Monitor blood pressure at home and report significant changes  Depression Current treatment with mirtazapine  is ineffective and causing adverse effects per patient. Previous treatment with Celexa  was effective but his psychiatrist has concerns about interaction with diltiazem . Cymbalta has been tried several years ago without success. Psychiatrist is no longer involved in care per patient's spouse. Discussion with cardiology clinical pharmacist suggests Cymbalta as an option without cardiac interaction concerns. - Send message to primary care physician regarding potential use of Cymbalta - Consider referral to a new  psychiatrist for further management options  OSA Continue nightly CPAP.          Signed, Artist Pouch, PA-C

## 2024-01-31 NOTE — Patient Instructions (Signed)
 Medication Instructions:  Your physician recommends that you continue on your current medications as directed. Please refer to the Current Medication list given to you today.  *If you need a refill on your cardiac medications before your next appointment, please call your pharmacy*  Lab Work: NONE If you have labs (blood work) drawn today and your tests are completely normal, you will receive your results only by: MyChart Message (if you have MyChart) OR A paper copy in the mail If you have any lab test that is abnormal or we need to change your treatment, we will call you to review the results.  Testing/Procedures: NONE  Follow-Up: At [facility], you and your health needs are our priority.  As part of our continuing mission to provide you with exceptional heart care, our providers are all part of one team.  This team includes your primary Cardiologist (physician) and Advanced Practice Providers or APPs (Physician Assistants and Nurse Practitioners) who all work together to provide you with the care you need, when you need it.  Your next appointment:   KEEP SCHEDULED FOLLOW-UP  We recommend signing up for the patient portal called "MyChart".  Sign up information is provided on this After Visit Summary.  MyChart is used to connect with patients for Virtual Visits (Telemedicine).  Patients are able to view lab/test results, encounter notes, upcoming appointments, etc.  Non-urgent messages can be sent to your provider as well.   To learn more about what you can do with MyChart, go to [email or web address].

## 2024-02-01 ENCOUNTER — Other Ambulatory Visit (HOSPITAL_COMMUNITY): Payer: Self-pay

## 2024-02-01 ENCOUNTER — Other Ambulatory Visit: Payer: Self-pay

## 2024-02-01 MED ORDER — DULOXETINE HCL 20 MG PO CPEP
20.0000 mg | ORAL_CAPSULE | Freq: Every day | ORAL | 2 refills | Status: DC
  Filled 2024-02-01: qty 30, 30d supply, fill #0
  Filled 2024-02-18: qty 30, 30d supply, fill #1
  Filled ????-??-??: fill #1

## 2024-02-01 NOTE — Telephone Encounter (Signed)
 Pt has been seen in office on 01/31/24 by Artist Ion

## 2024-02-04 ENCOUNTER — Encounter: Payer: Self-pay | Admitting: Internal Medicine

## 2024-02-07 ENCOUNTER — Other Ambulatory Visit: Payer: Self-pay

## 2024-02-08 ENCOUNTER — Other Ambulatory Visit (HOSPITAL_COMMUNITY): Payer: Self-pay

## 2024-02-13 ENCOUNTER — Institutional Professional Consult (permissible substitution): Admitting: Neurology

## 2024-02-18 ENCOUNTER — Other Ambulatory Visit (HOSPITAL_COMMUNITY): Payer: Self-pay | Admitting: Internal Medicine

## 2024-02-18 ENCOUNTER — Other Ambulatory Visit: Payer: Self-pay

## 2024-02-18 ENCOUNTER — Other Ambulatory Visit (HOSPITAL_COMMUNITY): Payer: Self-pay

## 2024-02-18 MED ORDER — DIGOXIN 250 MCG PO TABS
0.2500 mg | ORAL_TABLET | Freq: Every day | ORAL | 11 refills | Status: DC
  Filled 2024-02-18: qty 30, 30d supply, fill #0
  Filled 2024-03-15: qty 30, 30d supply, fill #1

## 2024-02-19 ENCOUNTER — Encounter: Payer: Self-pay | Admitting: Internal Medicine

## 2024-02-19 ENCOUNTER — Other Ambulatory Visit: Payer: Self-pay

## 2024-02-20 ENCOUNTER — Other Ambulatory Visit (HOSPITAL_COMMUNITY): Payer: Self-pay

## 2024-02-20 ENCOUNTER — Ambulatory Visit (HOSPITAL_COMMUNITY)
Admission: RE | Admit: 2024-02-20 | Discharge: 2024-02-20 | Disposition: A | Discharge status: Home / Self Care | Source: Ambulatory Visit | Attending: Cardiology | Admitting: Cardiology

## 2024-02-20 DIAGNOSIS — I4892 Unspecified atrial flutter: Secondary | ICD-10-CM | POA: Diagnosis present

## 2024-02-20 DIAGNOSIS — I4891 Unspecified atrial fibrillation: Secondary | ICD-10-CM | POA: Diagnosis present

## 2024-02-20 LAB — ECHOCARDIOGRAM COMPLETE
Area-P 1/2: 3.87 cm2
S' Lateral: 2.47 cm

## 2024-02-20 MED ORDER — PERFLUTREN LIPID MICROSPHERE
1.0000 mL | INTRAVENOUS | Status: AC | PRN
  Administered 2024-02-20 (×2): 3 mL via INTRAVENOUS

## 2024-02-21 ENCOUNTER — Other Ambulatory Visit (HOSPITAL_COMMUNITY): Payer: Self-pay

## 2024-02-21 ENCOUNTER — Other Ambulatory Visit: Payer: Self-pay

## 2024-02-21 MED ORDER — CITALOPRAM HYDROBROMIDE 20 MG PO TABS
20.0000 mg | ORAL_TABLET | Freq: Every day | ORAL | 1 refills | Status: AC
  Filled 2024-02-21: qty 90, 90d supply, fill #0
  Filled 2024-05-17: qty 90, 90d supply, fill #1

## 2024-02-21 MED ORDER — MIRABEGRON ER 25 MG PO TB24
25.0000 mg | ORAL_TABLET | Freq: Every day | ORAL | 3 refills | Status: DC
  Filled 2024-02-21: qty 30, 30d supply, fill #0

## 2024-02-21 NOTE — Telephone Encounter (Signed)
 Called patient advised of below they verbalized understanding.

## 2024-02-21 NOTE — Telephone Encounter (Signed)
-----   Message from Rollo FABIENE Louder sent at 02/21/2024 10:10 AM EDT ----- Please tell patient that their recent echocardiogram showed an EF (pump strength of the heart) was normal at 60-65%. There were no regional wall motion abnormalities. LV diastolic parameters normal,  meaning the heart muscle relaxes well. RV functions normally. No significant valvular abnormalities.   Overall normal echo. This is good news!  Thanks KJ  ----- Message ----- From: Interface, Three One Seven Sent: 02/20/2024   4:20 PM EDT To: Rollo JONELLE Louder, PA-C

## 2024-02-22 ENCOUNTER — Other Ambulatory Visit (HOSPITAL_COMMUNITY): Payer: Self-pay

## 2024-02-22 ENCOUNTER — Encounter (HOSPITAL_COMMUNITY): Payer: Self-pay

## 2024-02-22 ENCOUNTER — Ambulatory Visit (HOSPITAL_COMMUNITY): Payer: Self-pay | Admitting: *Deleted

## 2024-02-22 MED ORDER — MOUNJARO 2.5 MG/0.5ML ~~LOC~~ SOAJ
2.5000 mg | SUBCUTANEOUS | 0 refills | Status: DC
  Filled 2024-02-22 – 2024-02-23 (×2): qty 2, 28d supply, fill #0

## 2024-02-22 NOTE — Progress Notes (Signed)
 Please let patient know that his vitamin D  and vitamin B12 labs were normal.  Patient's urinalysis was sent for culture.  Patient CBC had mildly to moderately elevated white blood cell count (20.6 from 18.3) which is mildly higher, mildly elevated platelets (49 from 521) which is improving, mildly low hemoglobin (13.9 from 12.5) which is improving.  Recommend to follow-up with hematology for further evaluation.  Patient CMP had elevated blood sugars where patient's A1c did increase from 6.3-6.5 which is now considered diabetic.  Due to patient's diabetes and history of obesity, would recommend to try an injectable medication called Mounjaro  2.5 mg weekly injection to help improve blood sugars and also to help with weight loss.  Patient can start medication as long as there is no family or personal history of thyroid  cancer.   Would recommend to call right before the fourth injection and if tolerating as it can cause GI issues such as nausea/vomiting or diarrhea/constipation, we can increase the dose.  The increase in blood sugars can also be causing some of his urinary symptoms.  Call for any questions/concerns. If patient is willing, please order Mounjaro  2.5 mg weekly [place] injection for the month.

## 2024-02-23 ENCOUNTER — Encounter: Payer: Self-pay | Admitting: Internal Medicine

## 2024-02-23 ENCOUNTER — Other Ambulatory Visit (HOSPITAL_COMMUNITY): Payer: Self-pay

## 2024-02-23 DIAGNOSIS — I4891 Unspecified atrial fibrillation: Secondary | ICD-10-CM

## 2024-02-23 DIAGNOSIS — I4892 Unspecified atrial flutter: Secondary | ICD-10-CM

## 2024-02-25 ENCOUNTER — Other Ambulatory Visit (HOSPITAL_COMMUNITY): Payer: Self-pay

## 2024-02-25 ENCOUNTER — Ambulatory Visit (INDEPENDENT_AMBULATORY_CARE_PROVIDER_SITE_OTHER): Admitting: Neurology

## 2024-02-25 ENCOUNTER — Encounter: Payer: Self-pay | Admitting: Neurology

## 2024-02-25 ENCOUNTER — Other Ambulatory Visit: Payer: Self-pay

## 2024-02-25 VITALS — HR 84 | Ht 70.0 in | Wt 367.0 lb

## 2024-02-25 DIAGNOSIS — G4719 Other hypersomnia: Secondary | ICD-10-CM | POA: Diagnosis not present

## 2024-02-25 DIAGNOSIS — Z789 Other specified health status: Secondary | ICD-10-CM | POA: Diagnosis not present

## 2024-02-25 DIAGNOSIS — Z6841 Body Mass Index (BMI) 40.0 and over, adult: Secondary | ICD-10-CM

## 2024-02-25 DIAGNOSIS — G4733 Obstructive sleep apnea (adult) (pediatric): Secondary | ICD-10-CM

## 2024-02-25 MED ORDER — AMOXICILLIN-POT CLAVULANATE 875-125 MG PO TABS
1.0000 | ORAL_TABLET | Freq: Two times a day (BID) | ORAL | 0 refills | Status: AC
  Filled 2024-02-25: qty 14, 7d supply, fill #0

## 2024-02-25 NOTE — Progress Notes (Signed)
 Subjective:    Patient ID: Cody Hampton is a 63 y.o. male.  HPI    True Mar, MD, PhD [facility] [address] [address] [place], [place] [address]   Dear Dr. Macarthur,  I saw your patient, Cody Hampton, upon your kind request in my sleep clinic today for initial consultation of his sleep disorder, in particular, evaluation of his prior diagnosis of obstructive sleep apnea.  The patient is accompanied by his son and a friend today.  As you know, [name] is a 63 year old male with an underlying medical history of hypertension, arthritis, depression, atrial flutter/A-fib, status post cardioversion, history of GI bleed, iron  deficiency anemia, MGUS, osteoarthritis, vitamin B12 deficiency, thrombocytosis, hearing loss, and morbid obesity with a BMI of over 50, who was previously diagnosed with obstructive sleep apnea and placed on PAP therapy.  His Epworth sleepiness score is.  I reviewed your office note from 09/06/2023.  Prior sleep study results are not available for my review today.  Patient reports that testing was in or around 2013.  He got a new machine in 2018.  He has a ResMed air sense 10 AutoSet machine.  DME provider is [employer].  I reviewed his PAP compliance data from the past 90 days.  He does not use his machine much, percent use days greater than 4 hours and 0%, usage with 75 out of 90 days between 11/27/2023 and 02/24/2024 with an average usage for days on treatment of 19 minutes.  Residual AHI elevated at 30.7/h, leak on the higher side with a 95th percentile at 32.2 L/min, set pressure of 14 cm without EPR.  He reports difficulty tolerating the pressure and having trouble breathing out.  He does not have trouble using the mask.  He goes to bed generally around 11 or midnight and rise time is around 8.  Of note, he is on Remeron  at night for sleep and takes Percocet as needed and is on tramadol  as well for chronic knee pain.  He is working on  weight loss.  He has lost about 160 pounds in the past 2 years.  He drinks quite a bit of caffeine in the form of coffee, 24 ounces per day and soda, about 16 ounces per day.  His Past Medical History Is Significant For: Past Medical History:  Diagnosis Date   Arthritis    in knees   Depression    Hypertension    Obesity    Severe sleep apnea     His Past Surgical History Is Significant For: Past Surgical History:  Procedure Laterality Date   CARDIOVERSION N/A 10/29/2023   Procedure: CARDIOVERSION;  Surgeon: Loni Soyla LABOR, MD;  Location: [facility] INVASIVE CV LAB;  Service: Cardiovascular;  Laterality: N/A;   ESOPHAGOGASTRODUODENOSCOPY Left 10/09/2023   Procedure: EGD (ESOPHAGOGASTRODUODENOSCOPY);  Surgeon: Rollin Dover, MD;  Location: [facility];  Service: Gastroenterology;  Laterality: Left;  blood in stool   GIVENS CAPSULE STUDY N/A 10/24/2023   Procedure: IMAGING PROCEDURE, GI TRACT, INTRALUMINAL, VIA CAPSULE;  Surgeon: Rollin Dover, MD;  Location: [facility];  Service: Gastroenterology;  Laterality: N/A;   TRANSESOPHAGEAL ECHOCARDIOGRAM (CATH LAB) N/A 10/29/2023   Procedure: TRANSESOPHAGEAL ECHOCARDIOGRAM;  Surgeon: Loni Soyla LABOR, MD;  Location[facility] INVASIVE CV LAB;  Service: Cardiovascular;  Laterality: N/A;    His Family History Is Significant For: No family history on file.  His Social History Is Significant For: Social History   Socioeconomic History   Marital status: Married  Spouse name: Not on file   Number of children: Not on file   Years of education: Not on file   Highest education level: Bachelor's degree (e.g., BA, AB, BS)  Occupational History   Not on file  Tobacco Use   Smoking status: Never   Smokeless tobacco: Never   Tobacco comments:    Never smoked 07/05/23  Vaping Use   Vaping status: Never Used  Substance and Sexual Activity   Alcohol use: Not Currently   Drug use: Never   Sexual activity: Not on file  Other Topics Concern   Not on  file  Social History Narrative   Not on file   Social Drivers of Health   Financial Resource Strain: Low Risk  (12/29/2023)   Overall Financial Resource Strain (CARDIA)    Difficulty of Paying Living Expenses: Not very hard  Food Insecurity: Low Risk  (02/15/2024)   Received from [facility]   Hunger Vital Sign    Within the past 12 months, you worried that your food would run out before you got money to buy more: Never true    Within the past 12 months, the food you bought just didn't last and you didn't have money to get more. : Never true  Transportation Needs: Unmet Transportation Needs (02/15/2024)   Received from [facility]    In the past 12 months, has lack of reliable transportation kept you from medical appointments, meetings, work or from getting things needed for daily living? : Yes  Physical Activity: Inactive (12/29/2023)   Exercise Vital Sign    Days of Exercise per Week: 0 days    Minutes of Exercise per Session: Not on file  Stress: Stress Concern Present (12/29/2023)   [employer] of Occupational Health - Occupational Stress Questionnaire    Feeling of Stress: Very much  Social Connections: Socially Isolated (12/29/2023)   Social Connection and Isolation Panel    Frequency of Communication with Friends and Family: Once a week    Frequency of Social Gatherings with Friends and Family: Never    Attends Religious Services: Never    Database administrator or Organizations: No    Attends Engineer, structural: Not on file    Marital Status: Married    His Allergies Are:  Allergies  Allergen Reactions   Metoprolol  Anxiety, Hypertension, Nausea Only, Other (See Comments), Photosensitivity and Shortness Of Breath   Duloxetine  Other (See Comments)    Made mood worse.   Gabapentin Diarrhea and Other (See Comments)    Not tolerated well.   Hydrocodone Other (See Comments)    Made me feel odd in my head.  :   His Current Medications  Are:  Outpatient Encounter Medications as of 02/25/2024  Medication Sig   CALCIUM  PO Take 1 tablet by mouth daily.   diclofenac  Sodium (VOLTAREN  ARTHRITIS PAIN) 1 % GEL Apply 2 g topically 4 (four) times daily as needed (pain).   digoxin  (LANOXIN ) 0.25 MG tablet Take 1 tablet (0.25 mg total) by mouth daily.   diltiazem  (CARDIZEM ) 120 MG tablet Take 2 tablets (240 mg total) by mouth 2 (two) times daily.   glucose blood (ONETOUCH VERIO) test strip use to check blood sugar once daily DX E11.9   glucose blood (ONETOUCH VERIO) test strip use to check blood sugar once daily DX E11.9   Lancets (ONETOUCH DELICA PLUS LANCET33G) MISC Use to check blood sugar once daily DX E11.9   lisinopril  (ZESTRIL ) 10 MG  tablet Take 1 tablet (10 mg total) by mouth daily.   Multiple Vitamins-Minerals (BARIATRIC MULTIVITAMINS/IRON ) CAPS Take 1 capsule by mouth daily with breakfast.   oxyCODONE -acetaminophen  (PERCOCET/ROXICET) 5-325 MG tablet Take 1 tablet by mouth 2 (two) times a day as needed for moderate pain (4-6) or severe pain (7-10).   pantoprazole  (PROTONIX ) 40 MG tablet Take 1 tablet (40 mg total) by mouth daily. (Patient taking differently: Take 40 mg by mouth as needed.)   polycarbophil (FIBERCON) 625 MG tablet Take 625 mg by mouth daily.   testosterone  cypionate (DEPOTESTOSTERONE CYPIONATE) 200 MG/ML injection Inject 1 mL (200 mg total) into the muscle every 14 (fourteen) days.   traMADol  (ULTRAM ) 50 MG tablet Take 1 tablet (50 mg total) by mouth every 8 (eight) hours as needed for moderate pain (4-6) or severe pain (7-10).   Blood Glucose Monitoring Suppl (ONETOUCH VERIO FLEX SYSTEM) w/Device KIT use to check blood sugars once daily   citalopram  (CELEXA ) 20 MG tablet Take 1 tablet (20 mg total) by mouth daily. (Patient not taking: Reported on 02/25/2024)   mirabegron  ER (MYRBETRIQ ) 25 MG TB24 tablet Take 1 tablet (25 mg total) by mouth daily. (Patient not taking: Reported on 02/25/2024)   mirtazapine  (REMERON ) 15  MG tablet Take 1 tablet (15 mg total) by mouth at bedtime.   mirtazapine  (REMERON ) 7.5 MG tablet Take 1 tablet (7.5 mg total) by mouth at bedtime. (Patient not taking: Reported on 01/07/2024)   tirzepatide  (MOUNJARO ) 2.5 MG/0.5ML Pen Inject 2.5 mg into the skin every 7 (seven) days. (Patient not taking: Reported on 02/25/2024)   [DISCONTINUED] DULoxetine  (CYMBALTA ) 20 MG capsule Take 1 capsule (20 mg total) by mouth daily.   No facility-administered encounter medications on file as of 02/25/2024.  :   Review of Systems:  Out of a complete 14 point review of systems, all are reviewed and negative with the exception of these symptoms as listed below:  Review of Systems  Neurological:        Pt here for sleep consult Pt states wears cpap nightly Pt states at max pressure Pt states hard to breathe with mask om Pt states fatigue,controlled BP pt states  last sleep study 2013 Pt states admitted to hospital for afb     Objective:  Neurological Exam  Physical Exam Physical Examination:   Vitals:   02/25/24 1133  BP: 119/69  Pulse: 84    General Examination: The patient is a very pleasant 63 y.o. male in no acute distress. He appears chronically deconditioned, in a wheelchair.    HEENT: Normocephalic, atraumatic, pupils are equal, round and reactive to light, extraocular tracking is good without limitation to gaze excursion or nystagmus noted. Hearing is grossly intact. Face is symmetric with normal facial animation. Speech is clear with no dysarthria noted. There is no hypophonia. There is no lip, neck/head, jaw or voice tremor. Neck is supple with full range of passive and active motion. There are no carotid bruits on auscultation. Oropharynx exam reveals: moderate mouth dryness, edentulous, mild airway crowding, tonsillar size about 1+ bilaterally with cryptic appearance.  Neck circumference 19-1/2 inches.  Tongue protrudes centrally and palate elevates symmetrically.   Chest: Clear to  auscultation without wheezing, rhonchi or crackles noted.  Heart: S1+S2+0, regular and normal without murmurs, rubs or gallops noted.   Abdomen: Soft, non-tender and non-distended.  Extremities: There is mild swelling noted in the distal lower extremities bilaterally.   Skin: Warm and dry without trophic changes noted.   Musculoskeletal: exam  reveals no obvious joint deformities.   Neurologically:  Mental status: The patient is awake, alert and oriented in all 4 spheres. His immediate and remote memory, attention, language skills and fund of knowledge are appropriate. There is no evidence of aphasia, agnosia, apraxia or anomia. Speech is clear with normal prosody and enunciation. Thought process is linear. Mood is normal and affect is normal.  Cranial nerves II - XII are as described above under HEENT exam.  Motor exam: Normal bulk, limited mobility, no obvious action or resting tremor.  He is in a wheelchair. Fine motor skills and coordination: grossly intact in the upper extremities bilaterally.  Cerebellar testing: No dysmetria or intention tremor. Sensory exam: intact to light touch.  Assessment and Plan:  In summary, Bassam Dresch is a very pleasant 63 y.o.-year old male 63 year old male with an underlying medical history of hypertension, arthritis, depression, atrial flutter/A-fib, status post cardioversion, history of GI bleed, iron  deficiency anemia, MGUS, osteoarthritis, vitamin B12 deficiency, thrombocytosis, hearing loss, and morbid obesity with a BMI of over 50, who presents for evaluation of his obstructive sleep apnea (OSA).  While a laboratory attended sleep study is typically considered gold standard for evaluation of sleep disordered breathing, we mutually agreed to proceed with a home sleep test for reevaluation purposes.   I had a long chat with the patient about my findings and the diagnosis of sleep apnea, particularly OSA, its prognosis and treatment options. We talked  about medical/conservative treatments, surgical interventions and non-pharmacological approaches for symptom control. I explained, in particular, the risks and ramifications of untreated moderate to severe OSA, especially with respect to developing cardiovascular disease down the road, including congestive heart failure (CHF), difficult to treat hypertension, cardiac arrhythmias (particularly A-fib), neurovascular complications including TIA, stroke and dementia. Even type 2 diabetes has, in part, been linked to untreated OSA. Symptoms of untreated OSA may include (but may not be limited to) daytime sleepiness, nocturia (i.e. frequent nighttime urination), memory problems, mood irritability and suboptimally controlled or worsening mood disorder such as depression and/or anxiety, lack of energy, lack of motivation, physical discomfort, as well as recurrent headaches, especially morning or nocturnal headaches. We talked about the importance of maintaining a healthy lifestyle and striving for healthy weight.  In addition, we talked about the importance of striving for and maintaining good sleep hygiene, which also includes limiting caffeine. I recommended a sleep study at this time. I outlined the differences between a laboratory attended sleep study which is considered more comprehensive and accurate over the option of a home sleep test (HST); the latter may lead to underestimation of sleep disordered breathing in some instances and does not help with diagnosing upper airway resistance syndrome and is not accurate enough to diagnose primary central sleep apnea typically. I outlined possible surgical and non-surgical treatment options of OSA, including the use of a positive airway pressure (PAP) device (i.e. CPAP, AutoPAP/APAP or BiPAP in certain circumstances), a custom-made dental device (aka oral appliance, which would require a referral to a specialist dentist or orthodontist typically, and is generally speaking  not considered for patients with full dentures or edentulous state), upper airway surgical options, such as traditional UPPP (which is not considered a first-line treatment) or the Inspire device (hypoglossal nerve stimulator, which would involve a referral for consultation with an ENT surgeon, after careful selection, following inclusion criteria - also not first-line treatment). I explained the PAP treatment option to the patient in detail, as this is generally considered first-line treatment.  The patient indicated that he would be willing to continue with PAP therapy, if the need arises. I explained the importance of being compliant with PAP treatment, not only for insurance purposes but primarily to improve patient's symptoms symptoms, and for the patient's long term health benefit, including to reduce His cardiovascular risks longer-term.    We will pick up our discussion about the next steps and treatment options after testing.  We will keep him posted as to the test results by phone call and/or MyChart messaging where possible.  We will plan to follow-up in sleep clinic accordingly as well.  I answered all his questions today and the patient was in agreement.   I encouraged him to call with any interim questions, concerns, problems or updates or email us  through MyChart.  Generally speaking, sleep test authorizations may take up to 2 weeks, sometimes less, sometimes longer, the patient is encouraged to get in touch with us  if they do not hear back from the sleep lab staff directly within the next 2 weeks.  Thank you very much for allowing me to participate in the care of this nice patient. If I can be of any further assistance to you please do not hesitate to call me at [phone].  Sincerely,   True Mar, MD, PhD

## 2024-02-25 NOTE — Patient Instructions (Addendum)
 I will order a home sleep test for re-evaluation of your sleep apnea, as testing was in over 12 years ago. The home sleep test is done (HST) to re-establish/confirm the sleep apnea diagnosis and to get you a new machine through the insurance. Our sleep lab staff will reach out to you to arrange for pickup and for tutorial of the test equipment. I will write for a new machine after the HST confirms the OSA (obstructive sleep apnea) diagnosis. Please remember to not use the CPAP the night of testing, so we get diagnostic data, not treatment data. We will schedule a follow-up appointment after the new machine set-up date, typically within 31 to 89 days post treatment start.  As discussed, you good will need to show compliance with usage and fulfill a minimum usage percentage.

## 2024-02-27 ENCOUNTER — Other Ambulatory Visit (HOSPITAL_COMMUNITY): Payer: Self-pay

## 2024-02-27 ENCOUNTER — Telehealth: Payer: Self-pay | Admitting: Neurology

## 2024-02-27 MED ORDER — PREGABALIN 75 MG PO CAPS
75.0000 mg | ORAL_CAPSULE | Freq: Two times a day (BID) | ORAL | 2 refills | Status: DC
  Filled 2024-02-27: qty 60, 30d supply, fill #0

## 2024-02-27 NOTE — Telephone Encounter (Signed)
 HTA HST pending

## 2024-02-28 ENCOUNTER — Other Ambulatory Visit (HOSPITAL_COMMUNITY): Payer: Self-pay

## 2024-02-28 ENCOUNTER — Encounter (HOSPITAL_COMMUNITY): Payer: Self-pay | Admitting: Internal Medicine

## 2024-02-28 ENCOUNTER — Ambulatory Visit (HOSPITAL_COMMUNITY)
Admission: RE | Admit: 2024-02-28 | Discharge: 2024-02-28 | Disposition: A | Discharge status: Home / Self Care | Source: Ambulatory Visit | Attending: Internal Medicine | Admitting: Internal Medicine

## 2024-02-28 VITALS — BP 155/73 | HR 79 | Ht 70.0 in | Wt 367.1 lb

## 2024-02-28 DIAGNOSIS — I4892 Unspecified atrial flutter: Secondary | ICD-10-CM | POA: Diagnosis not present

## 2024-02-28 MED ORDER — DILTIAZEM HCL 120 MG PO TABS: 120.0000 mg | ORAL_TABLET | Freq: Two times a day (BID) | ORAL | Status: DC

## 2024-02-28 NOTE — Progress Notes (Signed)
 Virtual Visit via Video Note  I connected with Cody Hampton on 02/28/2024 at  2:30 PM EDT by a video enabled telemedicine application and verified that I am speaking with the correct person using two identifiers. Video telehealth visit is felt to be most appropriate for this patient at this time due to patient preference.   Location: Patient: Home Provider: [facility] Cody Hampton. [facility]   I discussed the limitations of evaluation and management by telemedicine and the availability of in person appointments. The patient expressed understanding and agreed to proceed.          Primary Care Physician: Lazoff, Shawn P, DO Primary Cardiologist: Soyla Cody Merck, MD Electrophysiologist: None     Referring Physician: ED   Cody Hampton is a 63 y.o. male with a history of HTN, obesity, OSA, and paroxysmal atrial fibrillation who presents for consultation in the [facility]. ED visit on 06/26/23 for new onset atrial flutter with RVR s/Hampton successful DCCV but was noted to return into Afib HR 80. Patient notes it occurred while at dentist, concerning for local anesthetic systemic toxicity (LAST). He did not appear to have other symptoms of LAST. Discharged on Lopressor  12.5 mg BID. Patient is on Eliquis  5 mg BID for a CHADS2VASC score of 1.  On evaluation today, he is currently in NSR. Patient did not know he had returned into Afib after cardioversion. He has history of OSA. He does not drink alcohol. He does admit to stressful environment when taking son for his cancer treatment. He drinks 1 cup of coffee daily and then 54 oz of half no caffeine / half caffeinated soda. He does not know if he has had any other episodes of Afib since ED visit. He has been compliant with Lopressor  12.5 mg BID and Eliquis  5 mg BID.   On follow up 08/08/23, he is currently in NSR. He has had no episodes of Afib since last office visit. He stopped the Eliquis   due to cost.   On follow up 09/27/23, he is currently in atrial flutter with RVR. Wife contacted office noting patient was in sinus tachycardia at PCP office with HR 148. He is planning to have major dental work for removal of multiple upper teeth. We called diltiazem  120 mg daily yesterday to pharmacy but patient has not picked up yet. He will pick up Eliquis  later today as they typically go to the pharmacy every Thursday. He feels lightheaded and dizzy at times.  On follow up 10/18/23, he is currently in atrial flutter with RVR. Patient here with son today and we spoke with wife during visit via phone. Seen in ED on 3/20 after last office visit due to atrial flutter with RVR. Due to not being on anticoagulation he was discharged without intervention. Hospital admission 3/29-4/2 due to 3 days of dark tarry stools. He was still in Afib with RVR and started on Cardizem  drip. Digoxin  given while in hospital but discontinued prior to discharge. Hemoccult positive. S/Hampton endoscopy with no obvious evidence of bleeding. Eliquis  resumed on discharge and patient given diltiazem  180 mg BID. He began full anticoagulation with Eliquis  on 4/3.   On follow up 11/16/23, he is currently in NSR. S/Hampton hospital admission 4/14-22/2025 for black stools and weakness with recent admission in March for GI bleed and EGD did not show any source of bleeding. S/Hampton video capsule endoscopy which did not show GI bleeding so Eliquis  restarted 4/18. Advised  to discontinue NSAIDs. S/Hampton successful TEE/DCCV on 4/21. HR difficult to control during admission and patient had dose of digoxin  increased to 0.25 mg daily. He is on diltiazem  240 mg twice daily. Currently taking Eliquis  5 mg BID.   On follow up 02/28/24 via virtual video visit. Patient's wife notes he has transitioned from Cymbalta  back to Celexa . He feels dizzy and pre syncopal at times since being on diltiazem . She informs he has official diagnosis of Diabetes mellitus due to recent A1C  level. She notes he feels poorly and can't sleep well. She notes he used to be okay with lisinopril . Heart monitor worn in July did not show any Afib. Review of phone notes show PCP phone call on 8/18 noting patient has UTI and is currently taking augmentin .   Today, he denies symptoms of palpitations, chest pain, shortness of breath, orthopnea, PND, lower extremity edema, dizziness, presyncope, syncope, snoring, daytime somnolence, bleeding, or neurologic sequela. The patient is tolerating medications without difficulties and is otherwise without complaint today.    Atrial Fibrillation Risk Factors:  he does have symptoms or diagnosis of sleep apnea.  he has a BMI of Body mass index is 52.67 kg/m.SABRA Filed Weights   02/28/24 1412  Weight: (!) 166.5 kg     Current Outpatient Medications  Medication Sig Dispense Refill   amoxicillin -clavulanate (AUGMENTIN ) 875-125 MG tablet Take 1 tablet by mouth 2 (two) times daily for 7 days. Take with food. 14 tablet 0   Blood Glucose Monitoring Suppl (ONETOUCH VERIO FLEX SYSTEM) w/Device KIT use to check blood sugars once daily 1 kit 0   CALCIUM  PO Take 1 tablet by mouth daily.     citalopram  (CELEXA ) 20 MG tablet Take 1 tablet (20 mg total) by mouth daily. 90 tablet 1   diclofenac  Sodium (VOLTAREN  ARTHRITIS PAIN) 1 % GEL Apply 2 g topically 4 (four) times daily as needed (pain).     digoxin  (LANOXIN ) 0.25 MG tablet Take 1 tablet (0.25 mg total) by mouth daily. 30 tablet 11   diltiazem  (CARDIZEM ) 120 MG tablet Take 2 tablets (240 mg total) by mouth 2 (two) times daily. 360 tablet 2   glucose blood (ONETOUCH VERIO) test strip use to check blood sugar once daily DX E11.9 100 each 5   glucose blood (ONETOUCH VERIO) test strip use to check blood sugar once daily DX E11.9 100 each 5   Lancets (ONETOUCH DELICA PLUS LANCET33G) MISC Use to check blood sugar once daily DX E11.9 100 each 2   lisinopril  (ZESTRIL ) 10 MG tablet Take 1 tablet (10 mg total) by mouth  daily. 90 tablet 3   mirabegron  ER (MYRBETRIQ ) 25 MG TB24 tablet Take 1 tablet (25 mg total) by mouth daily. 30 tablet 3   Multiple Vitamins-Minerals (BARIATRIC MULTIVITAMINS/IRON ) CAPS Take 1 capsule by mouth daily with breakfast.     oxyCODONE -acetaminophen  (PERCOCET/ROXICET) 5-325 MG tablet Take 1 tablet by mouth 2 (two) times a day as needed for moderate pain (4-6) or severe pain (7-10). 45 tablet 0   pantoprazole  (PROTONIX ) 40 MG tablet Take 1 tablet (40 mg total) by mouth daily. 30 tablet 2   polycarbophil (FIBERCON) 625 MG tablet Take 625 mg by mouth daily.     pregabalin  (LYRICA ) 75 MG capsule Take 1 capsule (75 mg total) by mouth 2 (two) times daily. 60 capsule 2   testosterone  cypionate (DEPOTESTOSTERONE CYPIONATE) 200 MG/ML injection Inject 1 mL (200 mg total) into the muscle every 14 (fourteen) days. 2 mL 2  No current facility-administered medications for this encounter.    Atrial Fibrillation Management history:  Previous antiarrhythmic drugs: none Previous cardioversions: 06/26/23 (ED), 10/29/23 (TEE/DCCV) Previous ablations: none Anticoagulation history: Eliquis    ROS- All systems are reviewed and negative except as per the HPI above.  Physical exam and ECG are both deferred due to today being a virtual video visit.   Echo (TEE) 10/29/23: 1. Left ventricular ejection fraction, by estimation, is 55 to 60%. The  left ventricle has normal function.   2. Right ventricular systolic function is mildly reduced. The right  ventricular size is normal.   3. Left atrial size was mildly dilated. No left atrial/left atrial  appendage thrombus was detected. The LAA emptying velocity was 67 cm/s.   4. Right atrial size was mildly dilated.   5. The mitral valve is normal in structure. Trivial mitral valve  regurgitation. No evidence of mitral stenosis.   6. The aortic valve is tricuspid. Aortic valve regurgitation is not  visualized. No aortic stenosis is present.   7. 3D  performed of the LAA and demonstrates normal LAA with no thrombus.   Cardiac monitor 01/2024: Patch Wear Time:  13 days and 23 hours    HR 27 - 157, average 77 bpm. 33 nonsustained SVT (longest 26 beats) No atrial fibrillation detected. Rare supraventricular ectopy. Rare ventricular ectopy. No symptom trigger episodes.     [facility] Cardiac Electrophysiology   ASSESSMENT & PLAN CHA2DS2-VASc Score = 1  The patient's score is based upon: CHF History: 0 HTN History: 1 Diabetes History: 0 Stroke History: 0 Vascular Disease History: 0 Age Score: 0 Gender Score: 0       ASSESSMENT AND PLAN: Persistent Atrial Flutter The patient's CHA2DS2-VASc score is 1, indicating a 0.6% annual risk of stroke.    Patient has limited options for rate control given previous intolerance of metoprolol . He is noting adverse effects on diltiazem . Will decrease diltiazem  to 120 mg one tablet BID. Continue digoxin .    I discussed the assessment and treatment plan with the patient. The patient was provided an opportunity to ask questions and all were answered. The patient agreed with the plan and demonstrated an understanding of the instructions. I answered all questions from patient and wife. They will use a large BP cuff on forearm to attempt accuracy in BP readings. There appears to be significant discrepancy between BP readings in office and at home (home is elevated by ~40 points systolic).   The patient was advised to call back or seek an in-person evaluation if the symptoms worsen or if the condition fails to improve as anticipated.  I provided 20 minutes of non-face-to-face time during this encounter.  Patient will call clinic next week with update on BP and HR.  I hereby voluntarily request, consent and authorize the Atrial Fibrillation Clinic and its employed or contracted physicians, physician assistants, nurse practitioners or other licensed health care professionals (the  Practitioner), to provide me with telemedicine health care services (the Services) as deemed necessary by the treating Practitioner. I acknowledge and consent to receive the Services by the Practitioner via telemedicine. I understand that the telemedicine visit will involve communicating with the Practitioner through live audiovisual communication technology and the disclosure of certain medical information by electronic transmission. I acknowledge that I have been given the opportunity to request an in-person assessment or other available alternative prior to the telemedicine visit and am voluntarily participating in the telemedicine visit.   I understand that I  have the right to withhold or withdraw my consent to the use of telemedicine in the course of my care at any time, without affecting my right to future care or treatment, and that the Practitioner or I may terminate the telemedicine visit at any time. I understand that I have the right to inspect all information obtained and/or recorded in the course of the telemedicine visit and may receive copies of available information for a reasonable fee.  I understand that some of the potential risks of receiving the Services via telemedicine include:   Delay or interruption in medical evaluation due to technological equipment failure or disruption;  Information transmitted may not be sufficient (e.g. poor resolution of images) to allow for appropriate medical decision making by the Practitioner; and/or  In rare instances, security protocols could fail, causing a breach of personal health information.   Furthermore, I acknowledge that it is my responsibility to provide information about my medical history, conditions and care that is complete and accurate to the best of my ability. I acknowledge that Practitioner's advice, recommendations, and/or decision may be based on factors not within their control, such as incomplete or inaccurate data provided by me  or distortions of diagnostic images or specimens that may result from electronic transmissions. I understand that the practice of medicine is not an exact science and that Practitioner makes no warranties or guarantees regarding treatment outcomes. I acknowledge that I will receive a copy of this consent concurrently upon execution via email to the email address I last provided but may also request a printed copy by calling the office of the Atrial Fibrillation Clinic.  I understand that my insurance will be billed for this visit.   I have read or had this consent read to me.  I understand the contents of this consent, which adequately explains the benefits and risks of the Services being provided via telemedicine.  I have been provided ample opportunity to ask questions regarding this consent and the Services and have had my questions answered to my satisfaction.  I give my informed consent for the services to be provided through the use of telemedicine in my medical care  By participating in this telemedicine visit I agree to the above.     Terra Pac, PA-C  [facility] [address] [place], [place] [address] [phone]

## 2024-03-03 ENCOUNTER — Other Ambulatory Visit (HOSPITAL_COMMUNITY): Payer: Self-pay

## 2024-03-03 ENCOUNTER — Other Ambulatory Visit: Payer: Self-pay

## 2024-03-03 ENCOUNTER — Encounter: Payer: Self-pay | Admitting: Hematology

## 2024-03-03 MED ORDER — CYCLOBENZAPRINE HCL 5 MG PO TABS
5.0000 mg | ORAL_TABLET | Freq: Three times a day (TID) | ORAL | 2 refills | Status: DC
  Filled 2024-03-03: qty 30, 10d supply, fill #0
  Filled 2024-03-10 – 2024-03-12 (×2): qty 30, 10d supply, fill #1
  Filled 2024-04-01: qty 30, 10d supply, fill #2

## 2024-03-04 ENCOUNTER — Encounter (INDEPENDENT_AMBULATORY_CARE_PROVIDER_SITE_OTHER): Payer: Self-pay | Admitting: *Deleted

## 2024-03-07 ENCOUNTER — Encounter (HOSPITAL_COMMUNITY): Payer: Self-pay

## 2024-03-07 ENCOUNTER — Other Ambulatory Visit (HOSPITAL_COMMUNITY): Payer: Self-pay

## 2024-03-09 ENCOUNTER — Encounter: Payer: Self-pay | Admitting: Internal Medicine

## 2024-03-10 ENCOUNTER — Encounter (HOSPITAL_COMMUNITY): Payer: Self-pay

## 2024-03-10 ENCOUNTER — Other Ambulatory Visit: Payer: Self-pay

## 2024-03-11 ENCOUNTER — Other Ambulatory Visit: Payer: Self-pay

## 2024-03-11 ENCOUNTER — Other Ambulatory Visit (HOSPITAL_COMMUNITY): Payer: Self-pay

## 2024-03-11 NOTE — Telephone Encounter (Signed)
 HST HTA shara: [id number] (exp. 02/27/24 to 05/27/24)

## 2024-03-12 ENCOUNTER — Telehealth: Payer: Self-pay | Admitting: Hematology

## 2024-03-12 ENCOUNTER — Other Ambulatory Visit (HOSPITAL_COMMUNITY): Payer: Self-pay

## 2024-03-12 ENCOUNTER — Encounter: Payer: Self-pay | Admitting: Hematology

## 2024-03-13 ENCOUNTER — Other Ambulatory Visit: Payer: Self-pay

## 2024-03-13 ENCOUNTER — Other Ambulatory Visit (HOSPITAL_COMMUNITY): Payer: Self-pay

## 2024-03-13 MED ORDER — EASY TOUCH HYPODERMIC NEEDLE 21G X 1" MISC
1 refills | Status: AC
  Filled 2024-03-13: qty 6, 84d supply, fill #0
  Filled 2024-05-29: qty 6, 84d supply, fill #1

## 2024-03-13 MED ORDER — SYRINGE/NEEDLE (DISP) 23G X 1" 3 ML MISC
1 refills | Status: AC
  Filled 2024-03-13: qty 6, 84d supply, fill #0
  Filled 2024-05-29: qty 6, 84d supply, fill #1

## 2024-03-14 ENCOUNTER — Other Ambulatory Visit (HOSPITAL_COMMUNITY): Payer: Self-pay

## 2024-03-14 ENCOUNTER — Other Ambulatory Visit: Payer: Self-pay

## 2024-03-14 DIAGNOSIS — D472 Monoclonal gammopathy: Secondary | ICD-10-CM

## 2024-03-15 ENCOUNTER — Encounter (INDEPENDENT_AMBULATORY_CARE_PROVIDER_SITE_OTHER): Payer: Self-pay | Admitting: Gastroenterology

## 2024-03-15 ENCOUNTER — Other Ambulatory Visit (HOSPITAL_COMMUNITY): Payer: Self-pay

## 2024-03-17 ENCOUNTER — Other Ambulatory Visit (HOSPITAL_COMMUNITY): Payer: Self-pay

## 2024-03-17 ENCOUNTER — Inpatient Hospital Stay (HOSPITAL_BASED_OUTPATIENT_CLINIC_OR_DEPARTMENT_OTHER): Admitting: Hematology

## 2024-03-17 ENCOUNTER — Inpatient Hospital Stay: Attending: Physician Assistant

## 2024-03-17 ENCOUNTER — Other Ambulatory Visit: Payer: Self-pay

## 2024-03-17 VITALS — BP 139/93 | HR 89 | Temp 97.2°F | Resp 20 | Wt 359.3 lb

## 2024-03-17 DIAGNOSIS — Z9884 Bariatric surgery status: Secondary | ICD-10-CM | POA: Insufficient documentation

## 2024-03-17 DIAGNOSIS — E538 Deficiency of other specified B group vitamins: Secondary | ICD-10-CM | POA: Diagnosis not present

## 2024-03-17 DIAGNOSIS — D72828 Other elevated white blood cell count: Secondary | ICD-10-CM | POA: Diagnosis not present

## 2024-03-17 DIAGNOSIS — D509 Iron deficiency anemia, unspecified: Secondary | ICD-10-CM | POA: Insufficient documentation

## 2024-03-17 DIAGNOSIS — D472 Monoclonal gammopathy: Secondary | ICD-10-CM | POA: Insufficient documentation

## 2024-03-17 DIAGNOSIS — D72829 Elevated white blood cell count, unspecified: Secondary | ICD-10-CM | POA: Insufficient documentation

## 2024-03-17 DIAGNOSIS — D72821 Monocytosis (symptomatic): Secondary | ICD-10-CM | POA: Diagnosis not present

## 2024-03-17 LAB — CMP (CANCER CENTER ONLY)
ALT: 13 U/L (ref 0–44)
AST: 12 U/L — ABNORMAL LOW (ref 15–41)
Albumin: 3.7 g/dL (ref 3.5–5.0)
Alkaline Phosphatase: 79 U/L (ref 38–126)
Anion gap: 9 (ref 5–15)
BUN: 11 mg/dL (ref 8–23)
CO2: 25 mmol/L (ref 22–32)
Calcium: 8.8 mg/dL — ABNORMAL LOW (ref 8.9–10.3)
Chloride: 104 mmol/L (ref 98–111)
Creatinine: 0.68 mg/dL (ref 0.61–1.24)
GFR, Estimated: >60 mL/min (ref >60)
Glucose, Bld: 164 mg/dL — ABNORMAL HIGH (ref 70–99)
Potassium: 4.1 mmol/L (ref 3.5–5.1)
Sodium: 138 mmol/L (ref 135–145)
Total Bilirubin: 0.4 mg/dL (ref 0.0–1.2)
Total Protein: 7.3 g/dL (ref 6.5–8.1)

## 2024-03-17 LAB — CBC WITH DIFFERENTIAL (CANCER CENTER ONLY)
Abs Immature Granulocytes: 0.08 K/uL — ABNORMAL HIGH (ref 0.00–0.07)
Basophils Absolute: 0.2 K/uL — ABNORMAL HIGH (ref 0.0–0.1)
Basophils Relative: 1 %
Eosinophils Absolute: 0.3 K/uL (ref 0.0–0.5)
Eosinophils Relative: 2 %
HCT: 45.9 % (ref 39.0–52.0)
Hemoglobin: 14.3 g/dL (ref 13.0–17.0)
Immature Granulocytes: 0 %
Lymphocytes Relative: 15 %
Lymphs Abs: 2.8 K/uL (ref 0.7–4.0)
MCH: 25.3 pg — ABNORMAL LOW (ref 26.0–34.0)
MCHC: 31.2 g/dL (ref 30.0–36.0)
MCV: 81.1 fL (ref 80.0–100.0)
Monocytes Absolute: 1.6 K/uL — ABNORMAL HIGH (ref 0.1–1.0)
Monocytes Relative: 9 %
Neutro Abs: 13.5 K/uL — ABNORMAL HIGH (ref 1.7–7.7)
Neutrophils Relative %: 73 %
Platelet Count: 500 K/uL — ABNORMAL HIGH (ref 150–400)
RBC: 5.66 MIL/uL (ref 4.22–5.81)
RDW: 18.9 % — ABNORMAL HIGH (ref 11.5–15.5)
WBC Count: 18.5 K/uL — ABNORMAL HIGH (ref 4.0–10.5)
nRBC: 0 % (ref 0.0–0.2)

## 2024-03-17 LAB — FERRITIN: Ferritin: 14 ng/mL — ABNORMAL LOW (ref 24–336)

## 2024-03-17 LAB — IRON AND IRON BINDING CAPACITY (CC-WL,HP ONLY)
Iron: 26 ug/dL — ABNORMAL LOW (ref 45–182)
Saturation Ratios: 7 % — ABNORMAL LOW (ref 17.9–39.5)
TIBC: 379 ug/dL (ref 250–450)
UIBC: 353 ug/dL (ref 117–376)

## 2024-03-18 ENCOUNTER — Other Ambulatory Visit: Payer: Self-pay

## 2024-03-18 ENCOUNTER — Other Ambulatory Visit (HOSPITAL_COMMUNITY): Payer: Self-pay

## 2024-03-19 ENCOUNTER — Other Ambulatory Visit: Payer: Self-pay

## 2024-03-20 ENCOUNTER — Other Ambulatory Visit: Payer: Self-pay

## 2024-03-21 ENCOUNTER — Other Ambulatory Visit

## 2024-03-21 ENCOUNTER — Ambulatory Visit: Admitting: Hematology

## 2024-03-24 ENCOUNTER — Other Ambulatory Visit: Payer: Self-pay | Admitting: Radiology

## 2024-03-24 DIAGNOSIS — D72828 Other elevated white blood cell count: Secondary | ICD-10-CM

## 2024-03-24 NOTE — H&P (Signed)
 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 male with past medical history significant for arthritis, anxiety/depression, hypertension, obesity, sleep apnea, prior gastric bypass, iron /B12 def, paroxysmal atrial fibrillation, anemia, diabetes as well as MGUS/ neutrophilia/monocytosis plus thrombocytosis.  He is scheduled today for image guided bone marrow biopsy to rule out MPN/MDS?  CMML.  *** Patient is Full Code  Past Medical History:  Diagnosis Date   Arthritis    in knees   Depression    Hypertension    Obesity    Severe sleep apnea     Past Surgical History:  Procedure Laterality Date   CARDIOVERSION N/A 10/29/2023   Procedure: CARDIOVERSION;  Surgeon: Loni Soyla LABOR, MD;  Location: [facility] INVASIVE CV LAB;  Service: Cardiovascular;  Laterality: N/A;   ESOPHAGOGASTRODUODENOSCOPY Left 10/09/2023   Procedure: EGD (ESOPHAGOGASTRODUODENOSCOPY);  Surgeon: Rollin Dover, MD;  Location: [facility];  Service: Gastroenterology;  Laterality: Left;  blood in stool   GIVENS CAPSULE STUDY N/A 10/24/2023   Procedure: IMAGING PROCEDURE, GI TRACT, INTRALUMINAL, VIA CAPSULE;  Surgeon: Rollin Dover, MD;  Location: [facility] ENDOSCOPY;  Service: Gastroenterology;  Laterality: N/A;   TRANSESOPHAGEAL ECHOCARDIOGRAM (CATH LAB) N/A 10/29/2023   Procedure: TRANSESOPHAGEAL ECHOCARDIOGRAM;  Surgeon: Loni Soyla LABOR, MD;  Location: [facility] INVASIVE CV LAB;  Service: Cardiovascular;  Laterality: N/A;    Allergies: Metoprolol , Duloxetine , Gabapentin, Hydrocodone, and Mirtazapine   Medications: Prior to Admission medications   Medication Sig Start Date End Date Taking? Authorizing Provider  Blood Glucose Monitoring Suppl (ONETOUCH VERIO FLEX  SYSTEM) w/Device KIT use to check blood sugars once daily 01/23/24     CALCIUM  PO Take 1 tablet by mouth daily.    [provider]  citalopram  (CELEXA ) 20 MG tablet Take 1 tablet (20 mg total) by mouth daily. 02/21/24     cyclobenzaprine  (FLEXERIL ) 5 MG tablet Take 1 tablet (5 mg total) by mouth 3 (three) times a day as needed (muscle pain). 03/03/24     diclofenac  Sodium (VOLTAREN  ARTHRITIS PAIN) 1 % GEL Apply 2 g topically 4 (four) times daily as needed (pain).    [provider]  digoxin  (LANOXIN ) 0.25 MG tablet Take 1 tablet (0.25 mg total) by mouth daily. 02/18/24   Terra Fairy PARAS, PA-C  diltiazem  (CARDIZEM ) 120 MG tablet Take 1 tablet (120 mg total) by mouth 2 (two) times daily. 02/28/24   Terra Fairy PARAS, PA-C  glucose blood [facility] VERIO) test strip use to check blood sugar once daily DX E11.9 01/22/24     glucose blood (ONETOUCH VERIO) test strip use to check blood sugar once daily DX E11.9 01/23/24     Lancets (ONETOUCH DELICA PLUS LANCET33G) MISC Use to check blood sugar once daily DX E11.9 01/22/24     lisinopril  (ZESTRIL ) 10 MG tablet Take 1 tablet (10 mg total) by mouth daily. 12/10/23   Terra Fairy PARAS, PA-C  Multiple Vitamins-Minerals (BARIATRIC MULTIVITAMINS/IRON ) CAPS Take 1 capsule by mouth daily with breakfast.    [provider]  NEEDLE, DISP, 21 G (EASY TOUCH HYPODERMIC NEEDLE) 21G X 1 MISC Inject 1 each into the muscle every 14 (fourteen) days. Use with Testosterone  03/13/24     oxyCODONE -acetaminophen  (PERCOCET/ROXICET) 5-325 MG tablet Take 1 tablet by mouth 2 (two) times a day as needed for moderate pain (4-6) or severe pain (7-10). 01/28/24  pantoprazole  (PROTONIX ) 40 MG tablet Take 1 tablet (40 mg total) by mouth daily. 10/31/23   Krishnan, Gokul, MD  polycarbophil (FIBERCON) 625 MG tablet Take 625 mg by mouth daily.    [provider]  SYRINGE-NEEDLE, DISP, 3 ML 23G X 1 3 ML MISC Inject 1 each into the muscle every 14 (fourteen) days. Use  with Testosterone  03/13/24     testosterone  cypionate (DEPOTESTOSTERONE CYPIONATE) 200 MG/ML injection Inject 1 mL (200 mg total) into the muscle every 14 (fourteen) days. 01/08/24     mirabegron  ER (MYRBETRIQ ) 25 MG TB24 tablet Take 1 tablet (25 mg total) by mouth daily. 02/21/24 03/18/24    pregabalin  (LYRICA ) 75 MG capsule Take 1 capsule (75 mg total) by mouth 2 (two) times daily. 02/27/24 03/18/24       No family history on file.  Social History   Socioeconomic History   Marital status: Married    Spouse name: Not on file   Number of children: Not on file   Years of education: Not on file   Highest education level: Bachelor's degree (e.g., BA, AB, BS)  Occupational History   Not on file  Tobacco Use   Smoking status: Never   Smokeless tobacco: Never   Tobacco comments:    Never smoked 07/05/23  Vaping Use   Vaping status: Never Used  Substance and Sexual Activity   Alcohol use: Not Currently   Drug use: Never   Sexual activity: Not on file  Other Topics Concern   Not on file  Social History Narrative   Not on file   Social Drivers of Health   Financial Resource Strain: Low Risk  (12/29/2023)   Overall Financial Resource Strain (CARDIA)    Difficulty of Paying Living Expenses: Not very hard  Food Insecurity: Low Risk  (02/15/2024)   Received from [facility]   Hunger Vital Sign    Within the past 12 months, you worried that your food would run out before you got money to buy more: Never true    Within the past 12 months, the food you bought just didn't last and you didn't have money to get more. : Never true  Transportation Needs: Unmet Transportation Needs (02/15/2024)   Received from [facility]    In the past 12 months, has lack of reliable transportation kept you from medical appointments, meetings, work or from getting things needed for daily living? : Yes  Physical Activity: Inactive (12/29/2023)   Exercise Vital Sign    Days of Exercise per Week: 0  days    Minutes of Exercise per Session: Not on file  Stress: Stress Concern Present (12/29/2023)   [employer] of Occupational Health - Occupational Stress Questionnaire    Feeling of Stress: Very much  Social Connections: Socially Isolated (12/29/2023)   Social Connection and Isolation Panel    Frequency of Communication with Friends and Family: Once a week    Frequency of Social Gatherings with Friends and Family: Never    Attends Religious Services: Never    Database administrator or Organizations: No    Attends Engineer, structural: Not on file    Marital Status: Married       Review of Systems  Vital Signs:   Advance Care Plan: no documents on file   Physical Exam  Imaging: No results found.  Labs:  CBC: Recent Labs    10/30/23 0432 12/26/23 0915 01/28/24 1052 03/17/24 1122  WBC 12.2* 18.4*  19.7* 18.5*  HGB 8.4* 9.8* 12.4* 14.3  HCT 27.2* 32.7* 41.8 45.9  PLT 509* 575* 529* 500*    COAGS: Recent Labs    10/06/23 0305 10/22/23 1307  INR 1.2 1.3*    BMP: Recent Labs    10/30/23 0432 12/26/23 0915 01/28/24 1052 03/17/24 1122  NA 136 136 134* 138  K 3.8 4.0 4.1 4.1  CL 105 103 104 104  CO2 25 25 20* 25  GLUCOSE 117* 170* 160* 164*  BUN 13 15 8 11   CALCIUM  7.9* 8.7* 8.3* 8.8*  CREATININE 0.74 0.73 1.00 0.68  GFRNONAA >60 >60 >60 >60    LIVER FUNCTION TESTS: Recent Labs    10/06/23 0305 10/22/23 1307 12/26/23 0915 03/17/24 1122  BILITOT 0.6 0.6 0.4 0.4  AST 23 25 15  12*  ALT 15 16 10 13   ALKPHOS 77 60 90 79  PROT 7.2 5.8* 7.0 7.3  ALBUMIN 3.7 3.0* 3.6 3.7    TUMOR MARKERS: No results for input(s): AFPTM, CEA, CA199, CHROMGRNA in the last 8760 hours.  Assessment and Plan: 63 y.o. male with past medical history significant for arthritis, anxiety/depression, hypertension, obesity, sleep apnea, prior gastric bypass, iron /B12 def, paroxysmal atrial fibrillation, anemia, diabetes as well as MGUS/  neutrophilia/monocytosis plus thrombocytosis.  He is scheduled today for image guided bone marrow biopsy to rule out MPN/MDS?  CMML. Risks and benefits of procedure was discussed with the patient  including, but not limited to bleeding, infection, damage to adjacent structures or low yield requiring additional tests.  All of the questions were answered and there is agreement to proceed.  Consent signed and in chart.    Thank you for allowing our service to participate in Lindwood Mogel 's care.  Electronically Signed: D. Franky Rakers, PA-C   03/24/2024, 2:53 PM      I spent a total of   20 minutes  in face to face in clinical consultation, greater than 50% of which was counseling/coordinating care for image guided bone marrow biopsy

## 2024-03-24 NOTE — Telephone Encounter (Signed)
 Spoke with patient wife who states that patient has been off of eliquis  for awhile and she would like to know if he can take the ibuprofen or naproxen now. Per Dr Lazoff he cannot take these medication due to previous bleeding issue. Patient to contact ortho to see what patient can due for pain.

## 2024-03-24 NOTE — Telephone Encounter (Signed)
 Patient cannot take any NSAIDs such as ibuprofen or naproxen while on the Eliquis .  If Tylenol  is not effective and patient should not be using oxycodone , tramadol  (Ultram ) could be an option but should not be mixed it with the oxycodone .  Let me know if patient would like tramadol .

## 2024-03-25 ENCOUNTER — Ambulatory Visit: Admitting: Neurology

## 2024-03-25 ENCOUNTER — Ambulatory Visit (HOSPITAL_COMMUNITY)
Admission: RE | Admit: 2024-03-25 | Discharge: 2024-03-25 | Disposition: A | Discharge status: Home / Self Care | Source: Ambulatory Visit | Attending: Hematology

## 2024-03-25 ENCOUNTER — Encounter: Payer: Self-pay | Admitting: Internal Medicine

## 2024-03-25 ENCOUNTER — Encounter (HOSPITAL_COMMUNITY): Payer: Self-pay

## 2024-03-25 ENCOUNTER — Telehealth: Payer: Self-pay | Admitting: Internal Medicine

## 2024-03-25 ENCOUNTER — Encounter (HOSPITAL_COMMUNITY): Payer: Self-pay | Admitting: *Deleted

## 2024-03-25 ENCOUNTER — Other Ambulatory Visit: Payer: Self-pay

## 2024-03-25 ENCOUNTER — Ambulatory Visit (HOSPITAL_COMMUNITY)
Admission: RE | Admit: 2024-03-25 | Discharge: 2024-03-25 | Disposition: A | Discharge status: Home / Self Care | Source: Ambulatory Visit | Attending: Hematology | Admitting: Hematology

## 2024-03-25 ENCOUNTER — Other Ambulatory Visit: Payer: Self-pay | Admitting: Hematology

## 2024-03-25 DIAGNOSIS — D72821 Monocytosis (symptomatic): Secondary | ICD-10-CM

## 2024-03-25 DIAGNOSIS — D72828 Other elevated white blood cell count: Secondary | ICD-10-CM

## 2024-03-25 DIAGNOSIS — D72829 Elevated white blood cell count, unspecified: Secondary | ICD-10-CM | POA: Diagnosis not present

## 2024-03-25 DIAGNOSIS — D72822 Plasmacytosis: Secondary | ICD-10-CM | POA: Diagnosis not present

## 2024-03-25 DIAGNOSIS — Z604 Social exclusion and rejection: Secondary | ICD-10-CM | POA: Insufficient documentation

## 2024-03-25 LAB — CBC WITH DIFFERENTIAL/PLATELET
Abs Immature Granulocytes: 0.08 K/uL — ABNORMAL HIGH (ref 0.00–0.07)
Basophils Absolute: 0.1 K/uL (ref 0.0–0.1)
Basophils Relative: 1 %
Eosinophils Absolute: 0.2 K/uL (ref 0.0–0.5)
Eosinophils Relative: 1 %
HCT: 43.7 % (ref 39.0–52.0)
Hemoglobin: 13.6 g/dL (ref 13.0–17.0)
Immature Granulocytes: 0 %
Lymphocytes Relative: 12 %
Lymphs Abs: 2.3 K/uL (ref 0.7–4.0)
MCH: 25.7 pg — ABNORMAL LOW (ref 26.0–34.0)
MCHC: 31.1 g/dL (ref 30.0–36.0)
MCV: 82.5 fL (ref 80.0–100.0)
Monocytes Absolute: 1.6 K/uL — ABNORMAL HIGH (ref 0.1–1.0)
Monocytes Relative: 8 %
Neutro Abs: 15.2 K/uL — ABNORMAL HIGH (ref 1.7–7.7)
Neutrophils Relative %: 78 %
Platelets: 470 K/uL — ABNORMAL HIGH (ref 150–400)
RBC: 5.3 MIL/uL (ref 4.22–5.81)
RDW: 18.2 % — ABNORMAL HIGH (ref 11.5–15.5)
WBC: 19.4 K/uL — ABNORMAL HIGH (ref 4.0–10.5)
nRBC: 0 % (ref 0.0–0.2)

## 2024-03-25 LAB — GLUCOSE, CAPILLARY: Glucose-Capillary: 204 mg/dL — ABNORMAL HIGH (ref 70–99)

## 2024-03-25 MED ORDER — LIDOCAINE HCL (PF) 1 % IJ SOLN
INTRAMUSCULAR | Status: AC
Start: 2024-03-25 — End: 2024-03-25
  Filled 2024-03-25: qty 30

## 2024-03-25 MED ORDER — SODIUM CHLORIDE 0.9 % IV SOLN: INTRAVENOUS | Status: DC

## 2024-03-25 MED ORDER — MIDAZOLAM HCL 2 MG/2ML IJ SOLN
INTRAMUSCULAR | Status: AC
  Filled 2024-03-25: qty 2

## 2024-03-25 MED ORDER — LIDOCAINE HCL (PF) 1 % IJ SOLN: 30.0000 mL | Freq: Once | INTRAMUSCULAR | Status: DC

## 2024-03-25 MED ORDER — MIDAZOLAM HCL 2 MG/2ML IJ SOLN
INTRAMUSCULAR | Status: AC | PRN
  Administered 2024-03-25 (×2): 1 mg via INTRAVENOUS

## 2024-03-25 MED ORDER — FENTANYL CITRATE (PF) 100 MCG/2ML IJ SOLN
INTRAMUSCULAR | Status: AC
  Filled 2024-03-25: qty 2

## 2024-03-25 MED ORDER — FENTANYL CITRATE (PF) 100 MCG/2ML IJ SOLN
INTRAMUSCULAR | Status: AC | PRN
  Administered 2024-03-25: 50 ug via INTRAVENOUS

## 2024-03-25 MED ORDER — LIDOCAINE HCL (PF) 1 % IJ SOLN
INTRAMUSCULAR | Status: AC | PRN
  Administered 2024-03-25: 10 mL

## 2024-03-25 NOTE — Procedures (Signed)
 Interventional Radiology Procedure Note  Procedure: CT Guided Biopsy of right iliac bone  Complications: None  Estimated Blood Loss: < 10 mL  Findings: 13 G core biopsy of iliac  bone marrow performed under CT guidance.  1 aspirate and 1 core samples obtained and sent to Pathology.  Cody DELENA Banner, MD

## 2024-03-25 NOTE — Telephone Encounter (Signed)
 Pt c/o medication issue:  1. Name of Medication:   digoxin  (LANOXIN ) 0.25 MG tablet  diltiazem  (CARDIZEM ) 120 MG tablet   2. How are you currently taking this medication (dosage and times per day)?   3. Are you having a reaction (difficulty breathing--STAT)?   4. What is your medication issue?   Wife Lourdes) stated these medication is not working well for him.  Wife noted patient's HR goes up when he stands up and gets vertigo/disoriented and goes back down when he sits.  Wife noted patient's BP runs in the 150-160's and HR in the 70's.  Wife stated patient's sugar readings has been running at 150 and patient was previously in the 100's. Wife noted patient has been having chronic diarrhea (pudding/not formed stool).  Wife also noted patient has been losing weight.  Wife stated patient gets very fatigued and does not have restful sleep.  Wife stated patient's legs and feet feel very tight and has cramping.  Wife stated patient is also having memory issues.  Wife noted she also sent a message in MyChart.

## 2024-03-25 NOTE — Discharge Instructions (Addendum)

## 2024-03-25 NOTE — Telephone Encounter (Signed)
 Spoke with pt's wife who was very upset about the symptoms the pt is having including, severe fatigue, defecating on himself in bed, memory issues and elevated blood sugars. Wife spoke very fast and loudly and was clearly angry and frustrated. I had difficulty getting a word in. Wife is sure that the pt's symptoms are from diltiazem  and digoxin . Wife stated Suarez PA-C prescribed the medications and stated the pt began to have the symptoms when he started the medications. Wife stated at one point the amount of medications were halved which led her to believe the PA knew the medications were causing the pt's issues. Wife stated she is desperate for help and that the pt's medications must be changed. Wife was told that her message would be forwarded to our pharmacists and the pt's doctors for their advise. Wife was also given ED precautions. Wife verbalized understanding. All questions if any were answered.

## 2024-03-26 ENCOUNTER — Ambulatory Visit (INDEPENDENT_AMBULATORY_CARE_PROVIDER_SITE_OTHER): Admitting: Gastroenterology

## 2024-03-26 ENCOUNTER — Encounter: Payer: Self-pay | Admitting: Hematology

## 2024-03-26 ENCOUNTER — Encounter: Payer: Self-pay | Admitting: Internal Medicine

## 2024-03-26 NOTE — Progress Notes (Unsigned)
 Cardiology Office Note:   Date:  03/27/2024  ID:  Cody Hampton, DOB [birth year], MRN [id number] PCP:  Lazoff, Shawn P, DO  [facility] Providers DOD Cardiologist:  Wendel Haws, MD Cardiologist:  Loni Referring MD: Lazoff, Shawn P, DO  Chief Complaint/Reason for Referral: DOD appointment failure to thrive ASSESSMENT:    1. Failure to thrive in adult   2. Atrial fibrillation/flutter (HCC)   3. Type 2 diabetes mellitus without complication, without long-term current use of insulin (HCC)   4. Primary hypertension   5. BMI 50.0-59.9, adult [facility])     PLAN:   In order of problems listed above: Failure to thrive: Check CBC, CMP, digoxin  level, reflex TSH today. Atrial fibrillation/flutter: CV2 score of 1.  Had a conversation with wife over the phone.  I reviewed her notes that she provided here in office.  It is clear that his symptoms seem to worsen after he was started on digoxin  and diltiazem .  His digoxin  level was 1.0 in July.  We spoke about digoxin  and diltiazem  holiday.  They agree with this.  We will discontinue both of these medications and see how he does.  EKG today demonstrates normal sinus rhythm T2DM:  Consider aspirin 81 mg, consider statin; currently off lisinopril . Hypertension: Currently off lisinopril  for now.  May need to restart in the future.  Will follow-up labs. Elevated BMI: Consider GLP-1 receptor agonist in the future            Dispo:  Return in about 2 weeks (around 04/10/2024).       I spent 38 minutes reviewing all clinical data during and prior to this visit including all relevant imaging studies, laboratories, clinical information from other health systems and prior notes from both Cardiology and other specialties, interviewing the patient, conducting a complete physical examination, and coordinating care in order to formulate a comprehensive and personalized evaluation and treatment plan.   History of Present Illness:    FOCUSED PROBLEM  LIST:   PAF Status post cardioversion 2024 Atrial flutter CV 2 score of 1 Intolerant of beta-blockers T2DM Not on insulin Hypertension BMI 52  September 2025:  Patient consents to use of AI scribe. The patient is a 63 year old male with the above listed medical problems here for DOD visit due to failure to thrive.  Patient and the patient's wife contacted our office due to the patient developing symptoms of fatigue, incontinence of stool, memory issues, and elevated blood sugars.  Patient's wife believes that this was due to diltiazem  and digoxin .  At the patient's last visit in August his diltiazem  was decreased to 120 mg twice daily.  Digoxin  level in July was 1.0. The patient's wife reached out to our office because of many issues including depression, forgetfulness, increasing weakness requiring a wheelchair or walker, incontinence, arthritis, and overall significant functional decline.  She is concerned that the digoxin  and diltiazem  are causing many of his issues including potentially memory impairment.    The patient is here with a friend.  His wife is on the phone as she is bedbound.  Basically the patient corroborates most of what was described above.  He tells me that he has issues with remaining stable when he stands up.  He has had no syncopal issues.     Current Medications: Current Meds  Medication Sig   Blood Glucose Monitoring Suppl (ONETOUCH VERIO FLEX SYSTEM) w/Device KIT use to check blood sugars once daily   CALCIUM  PO Take 1 tablet by mouth  daily.   citalopram  (CELEXA ) 20 MG tablet Take 1 tablet (20 mg total) by mouth daily.   cyclobenzaprine  (FLEXERIL ) 5 MG tablet Take 1 tablet (5 mg total) by mouth 3 (three) times a day as needed (muscle pain).   diclofenac  Sodium (VOLTAREN  ARTHRITIS PAIN) 1 % GEL Apply 2 g topically 4 (four) times daily as needed (pain).   glucose blood (ONETOUCH VERIO) test strip use to check blood sugar once daily DX E11.9   Multiple  Vitamins-Minerals (BARIATRIC MULTIVITAMINS/IRON ) CAPS Take 1 capsule by mouth daily with breakfast.   NEEDLE, DISP, 21 G (EASY TOUCH HYPODERMIC NEEDLE) 21G X 1 MISC Inject 1 each into the muscle every 14 (fourteen) days. Use with Testosterone    oxyCODONE -acetaminophen  (PERCOCET/ROXICET) 5-325 MG tablet Take 1 tablet by mouth 2 (two) times a day as needed for moderate pain (4-6) or severe pain (7-10).   pantoprazole  (PROTONIX ) 40 MG tablet Take 1 tablet (40 mg total) by mouth daily.   polycarbophil (FIBERCON) 625 MG tablet Take 625 mg by mouth daily.   SYRINGE-NEEDLE, DISP, 3 ML 23G X 1 3 ML MISC Inject 1 each into the muscle every 14 (fourteen) days. Use with Testosterone    testosterone  cypionate (DEPOTESTOSTERONE CYPIONATE) 200 MG/ML injection Inject 1 mL (200 mg total) into the muscle every 14 (fourteen) days.   [DISCONTINUED] digoxin  (LANOXIN ) 0.25 MG tablet Take 1 tablet (0.25 mg total) by mouth daily.   [DISCONTINUED] diltiazem  (CARDIZEM ) 120 MG tablet Take 1 tablet (120 mg total) by mouth 2 (two) times daily.     Review of Systems:   Please see the history of present illness.    All other systems reviewed and are negative.     EKGs/Labs/Other Test Reviewed:   EKG:   EKG Interpretation Date/Time:  Thursday March 27 2024 15:13:25 EDT Ventricular Rate:  77 PR Interval:  164 QRS Duration:  94 QT Interval:  352 QTC Calculation: 398 R Axis:   -33  Text Interpretation: Normal sinus rhythm Left axis deviation Possible Lateral infarct (cited on or before 28-Jan-2024) When compared with ECG of 28-Jan-2024 10:47, Questionable change in initial forces of Lateral leads Confirmed by Wendel Haws ([id number]) on 03/27/2024 3:15:36 PM        CARDIAC STUDIES: Refer to CV Procedures and Imaging Tabs   Risk Assessment/Calculations:    CHA2DS2-VASc Score = 1   This indicates a 0.6% annual risk of stroke. The patient's score is based upon: CHF History: 0 HTN History: 1 Diabetes History:  0 Stroke History: 0 Vascular Disease History: 0 Age Score: 0 Gender Score: 0          Physical Exam:   VS:  BP 136/76   Pulse 81   Ht 5' 10 (1.778 m)   Wt (!) 357 lb (161.9 kg)   SpO2 96%   BMI 51.22 kg/m        Wt Readings from Last 3 Encounters:  03/27/24 (!) 357 lb (161.9 kg)  03/25/24 (!) 359 lb (162.8 kg)  03/17/24 (!) 359 lb 4.8 oz (163 kg)      GENERAL:  No apparent distress, AOx3, diaphoretic HEENT:  No carotid bruits, +2 carotid impulses, no scleral icterus CAR: RRR no murmurs, gallops, rubs, or thrills RES:  Clear to auscultation bilaterally ABD:  Soft, nontender, nondistended, positive bowel sounds x 4 VASC:  +2 radial pulses, +2 carotid pulses NEURO:  CN 2-12 grossly intact; motor and sensory grossly intact PSYCH:  No active depression or anxiety EXT:  +1 edema, ecchymosis,  or cyanosis  Signed, Lurena MARLA Red, MD  03/27/2024 3:16 PM    [facility] [address] [place], [place], [place]  [address] Phone: [phone]; Fax: [phone]   Note:  This document was prepared using Dragon voice recognition software and may include unintentional dictation errors.

## 2024-03-26 NOTE — Telephone Encounter (Signed)
 I spoke with wife and she will keep October 2nd  appointment with Dr.Acharya and will update us  if his symptoms change or worsen.

## 2024-03-26 NOTE — Telephone Encounter (Signed)
 Called patient's wife (DPR) about Dr. Laurence advisement. Made patient an appointment to see DOD tomorrow.

## 2024-03-27 ENCOUNTER — Encounter: Payer: Self-pay | Admitting: Hematology

## 2024-03-27 ENCOUNTER — Encounter: Payer: Self-pay | Admitting: Internal Medicine

## 2024-03-27 ENCOUNTER — Ambulatory Visit: Attending: Internal Medicine | Admitting: Internal Medicine

## 2024-03-27 VITALS — BP 136/76 | HR 81 | Ht 70.0 in | Wt 357.0 lb

## 2024-03-27 DIAGNOSIS — I4891 Unspecified atrial fibrillation: Secondary | ICD-10-CM | POA: Diagnosis not present

## 2024-03-27 DIAGNOSIS — I10 Essential (primary) hypertension: Secondary | ICD-10-CM | POA: Insufficient documentation

## 2024-03-27 DIAGNOSIS — R627 Adult failure to thrive: Secondary | ICD-10-CM | POA: Diagnosis not present

## 2024-03-27 DIAGNOSIS — E119 Type 2 diabetes mellitus without complications: Secondary | ICD-10-CM | POA: Diagnosis not present

## 2024-03-27 DIAGNOSIS — Z6841 Body Mass Index (BMI) 40.0 and over, adult: Secondary | ICD-10-CM | POA: Insufficient documentation

## 2024-03-27 DIAGNOSIS — I4892 Unspecified atrial flutter: Secondary | ICD-10-CM | POA: Insufficient documentation

## 2024-03-27 NOTE — Patient Instructions (Addendum)
 Medication Instructions:  STOP Diltiazem    STOP Digoxin    *If you need a refill on your cardiac medications before your next appointment, please call your pharmacy*  Lab Work: To be completed today: CBC, CMP, digoxin  level, reflux TSH  If you have labs (blood work) drawn today and your tests are completely normal, you will receive your results only by: MyChart Message (if you have MyChart) OR A paper copy in the mail If you have any lab test that is abnormal or we need to change your treatment, we will call you to review the results.  Testing/Procedures: None ordered today.  Follow-Up: At [facility], you and your health needs are our priority.  As part of our continuing mission to provide you with exceptional heart care, our providers are all part of one team.  This team includes your primary Cardiologist (physician) and Advanced Practice Providers or APPs (Physician Assistants and Nurse Practitioners) who all work together to provide you with the care you need, when you need it.  Your next appointment:   2 week(s)  Provider:   One of our Advanced Practice Providers (APPs): Morse Clause, PA-C  Lamarr Satterfield, NP Miriam Shams, NP  Olivia Pavy, PA-C Josefa Beauvais, NP  Leontine Salen, PA-C Orren Fabry, PA-C  [place], PA-C Ernest Dick, NP  Damien Braver, NP Jon Hails, PA-C  Waddell Donath, PA-C    Dayna Dunn, PA-C  Scott Weaver, PA-C Lum Louis, NP Katlyn West, NP Callie Goodrich, PA-C  Xika Zhao, NP Sheng Haley, PA-C    Kathleen Johnson, PA-C

## 2024-03-27 NOTE — Progress Notes (Signed)
 HEMATOLOGY/ONCOLOGY PROGRESS NOTE  Date of Service: 03/27/2024  Patient Care Team: Lazoff, Shawn P, DO as PCP - General (Family Medicine) Loni Soyla LABOR, MD as PCP - Cardiology (Cardiology) Terra Fairy PARAS, PA-C as Physician Assistant (Cardiology)  CHIEF COMPLAINTS/PURPOSE OF CONSULTATION:   Follow-up for IgG kappa MGUS , leukocytosis.,  Iron  deficiency anemia and B12 deficiency  INTERVAL HISTORY:  Cody Hampton is a wonderful 63 y.o. male who is in clinic for continued evaluation and management of IgG kappa MGUS and leukocytosis.  He was last seen by Johnston Police PA-C on 12/26/2023. Patient notes he is working to get a repeat sleep study to be evaluated for a new CPAP machine. Patient notes he has been seen by orthopedics for chronic left knee pain that is limiting his ambulation.  He feels that he will need surgery for this but needs time ligation of his other medical issues. Continues to follow with cardiology for management of his atrial flutter  MEDICAL HISTORY:  Past Medical History:  Diagnosis Date   Arthritis    in knees   Depression    Hypertension    Obesity    Severe sleep apnea     SURGICAL HISTORY: Gastric bypass surgery   SOCIAL HISTORY: Social History   Socioeconomic History   Marital status: Married    Spouse name: Not on file   Number of children: Not on file   Years of education: Not on file   Highest education level: Bachelor's degree (e.g., BA, AB, BS)  Occupational History   Not on file  Tobacco Use   Smoking status: Never   Smokeless tobacco: Never   Tobacco comments:    Never smoked 07/05/23  Vaping Use   Vaping status: Never Used  Substance and Sexual Activity   Alcohol use: Not Currently   Drug use: Never   Sexual activity: Not on file  Other Topics Concern   Not on file  Social History Narrative   Not on file   Social Drivers of Health   Financial Resource Strain: Low Risk  (12/29/2023)   Overall Financial Resource  Strain (CARDIA)    Difficulty of Paying Living Expenses: Not very hard  Food Insecurity: Low Risk  (02/15/2024)   Received from [facility]   Hunger Vital Sign    Within the past 12 months, you worried that your food would run out before you got money to buy more: Never true    Within the past 12 months, the food you bought just didn't last and you didn't have money to get more. : Never true  Transportation Needs: Unmet Transportation Needs (02/15/2024)   Received from [facility]    In the past 12 months, has lack of reliable transportation kept you from medical appointments, meetings, work or from getting things needed for daily living? : Yes  Physical Activity: Inactive (12/29/2023)   Exercise Vital Sign    Days of Exercise per Week: 0 days    Minutes of Exercise per Session: Not on file  Stress: Stress Concern Present (12/29/2023)   [employer] of Occupational Health - Occupational Stress Questionnaire    Feeling of Stress: Very much  Social Connections: Socially Isolated (12/29/2023)   Social Connection and Isolation Panel    Frequency of Communication with Friends and Family: Once a week    Frequency of Social Gatherings with Friends and Family: Never    Attends Religious Services: Never    Database administrator or  Organizations: No    Attends Banker Meetings: Not on file    Marital Status: Married  Intimate Partner Violence: Not At Risk (10/23/2023)   Humiliation, Afraid, Rape, and Kick questionnaire    Fear of Current or Ex-Partner: No    Emotionally Abused: No    Physically Abused: No    Sexually Abused: No    FAMILY HISTORY: Reports that his father had bladder cancer and was not a smoker. Patient denies any history of smoking. He also reports that two of his sisters have cancer, but is unsure of the type. He notes that one of his sisters endorses a lymph node cancer.   ALLERGIES:  is allergic to metoprolol , duloxetine , gabapentin,  hydrocodone, and mirtazapine .  MEDICATIONS:  Current Outpatient Medications  Medication Sig Dispense Refill   Blood Glucose Monitoring Suppl (ONETOUCH VERIO FLEX SYSTEM) w/Device KIT use to check blood sugars once daily 1 kit 0   CALCIUM  PO Take 1 tablet by mouth daily.     citalopram  (CELEXA ) 20 MG tablet Take 1 tablet (20 mg total) by mouth daily. 90 tablet 1   cyclobenzaprine  (FLEXERIL ) 5 MG tablet Take 1 tablet (5 mg total) by mouth 3 (three) times a day as needed (muscle pain). 30 tablet 2   diclofenac  Sodium (VOLTAREN  ARTHRITIS PAIN) 1 % GEL Apply 2 g topically 4 (four) times daily as needed (pain).     glucose blood (ONETOUCH VERIO) test strip use to check blood sugar once daily DX E11.9 100 each 5   Multiple Vitamins-Minerals (BARIATRIC MULTIVITAMINS/IRON ) CAPS Take 1 capsule by mouth daily with breakfast.     NEEDLE, DISP, 21 G (EASY TOUCH HYPODERMIC NEEDLE) 21G X 1 MISC Inject 1 each into the muscle every 14 (fourteen) days. Use with Testosterone  50 each 1   oxyCODONE -acetaminophen  (PERCOCET/ROXICET) 5-325 MG tablet Take 1 tablet by mouth 2 (two) times a day as needed for moderate pain (4-6) or severe pain (7-10). 45 tablet 0   pantoprazole  (PROTONIX ) 40 MG tablet Take 1 tablet (40 mg total) by mouth daily. 30 tablet 2   polycarbophil (FIBERCON) 625 MG tablet Take 625 mg by mouth daily.     SYRINGE-NEEDLE, DISP, 3 ML 23G X 1 3 ML MISC Inject 1 each into the muscle every 14 (fourteen) days. Use with Testosterone  100 each 1   testosterone  cypionate (DEPOTESTOSTERONE CYPIONATE) 200 MG/ML injection Inject 1 mL (200 mg total) into the muscle every 14 (fourteen) days. 2 mL 2   No current facility-administered medications for this visit.    REVIEW OF SYSTEMS:   .10 Point review of Systems was done is negative except as noted above.  PHYSICAL EXAMINATION:  Vitals:   03/17/24 1145  BP: (!) 139/93  Pulse: 89  Resp: 20  Temp: (!) 97.2 F (36.2 C)  SpO2: 96%  . GENERAL:alert, in no  acute distress and comfortable SKIN: no acute rashes, no significant lesions EYES: conjunctiva are pink and non-injected, sclera anicteric OROPHARYNX: MMM, no exudates, no oropharyngeal erythema or ulceration NECK: supple, no JVD LYMPH:  no palpable lymphadenopathy in the cervical, axillary or inguinal regions LUNGS: clear to auscultation b/l with normal respiratory effort HEART: regular rate & rhythm ABDOMEN:  normoactive bowel sounds , non tender, not distended. Extremity: 2+ pedal edema PSYCH: alert & oriented x 3 with fluent speech NEURO: no focal motor/sensory deficits   LABORATORY DATA:  I have reviewed the data as listed  .    Latest Ref Rng & Units 03/17/2024  11:22 AM  CBC  WBC 3.4 - 10.8 x10E3/uL 18.5   Hemoglobin 13.0 - 17.7 g/dL 85.6   Hematocrit 62.4 - 51.0 % 45.9   Platelets 150 - 450 x10E3/uL 500     .    03/27/2024    3:40 PM 03/17/2024   11:22 AM 01/28/2024   10:52 AM  CMP  Glucose WILL FOLLOW  P 164  160   BUN WILL FOLLOW  P 11  8   Creatinine WILL FOLLOW  P 0.68  1.00   Sodium WILL FOLLOW  P 138  134   Potassium WILL FOLLOW  P 4.1  4.1   Chloride WILL FOLLOW  P 104  104   CO2 WILL FOLLOW  P 25  20   Calcium  WILL FOLLOW  P 8.8  8.3   Total Protein WILL FOLLOW  P 7.3    Total Bilirubin WILL FOLLOW  P 0.4    Alkaline Phos WILL FOLLOW  P 79    AST WILL FOLLOW  P 12    ALT WILL FOLLOW  P 13      P Preliminary result   . Lab Results  Component Value Date   IRON  26 (L) 03/17/2024   TIBC 379 03/17/2024   IRONPCTSAT 7 (L) 03/17/2024   (Iron  and TIBC)  Lab Results  Component Value Date   FERRITIN 14 (L) 03/17/2024      important suggestion  View Detailed Reports    important suggestion  View Condensed Results Report   Vitamin D , 25-Hydroxy Order: [id number]  suggestion  Information displayed in this report may not trend or trigger automated decision support.   Component Ref Range & Units 1 mo ago  Vitamin D  25-Hydroxy 30.0 - 100.0 ng/mL  37.3  Comment: Deficient: <20 ng/mL Insufficient: 20-30 ng/mL Sufficient: 30-100 ng/mL Upper Safety Limit/Toxicity: >100 ng/mL   Specimen Collected: 02/21/24 15:27   Performed by: [facility](CLIA# [id number]) Last Resulted: 02/21/24 21:24  Received From: [facility]  Result Received: 02/25/24 08:43   View Encounter      Received Information Vitamin B12 Order: [id number]  suggestion  Information displayed in this report may not trend or trigger automated decision support.   Component Ref Range & Units 1 mo ago  Vitamin B-12 180 - 914 pg/mL 309  Comment: Normal Range: 180-914 pg/mL Indeterminate Range: 145-180 pg/mL Deficient Range: <145 pg/mL   Specimen Collected: 02/21/24 15:27   Performed by: [facility](CLIA# [id number]) Last Resulted: 02/21/24 21:24  Received From: [facility]  Result Received: 02/25/24 08:43   View Encounter      Received Information Testosterone , Bioavailable - Free and Weakly Bound Order: [id number]  suggestion  Information displayed in this report may not trend or trigger automated decision support.   Component Ref Range & Units 1 mo ago  Testosterone  264 - 916 ng/dL 286  Comment: Adult male reference interval is based on a population of healthy nonobese males (BMI <30) between 66 and 33 years old. Travison, et.al. JCEM [phone]. PMID: 71675896.  Testosterone , % Free & Weakly Bound 9.0 - 46.0 % 31.1  Comment: This test was developed and its performance characteristics determined by [facility]. It has not been cleared or approved by the Food and Drug Administration.  Testosterone , Free & Weakly Bound 40.0 - 250.0 ng/dL 778.2  Narrative  Performed at:  [address] [place], [place]  [id number] Lab Director: Frankey Sas MD, Phone:  [phone]  Specimen Collected: 01/28/24 09:38   Performed by: HO[facility] Last Resulted: 02/01/24 07:37  Received From:  [facility]  Result Received: 02/04/24 08:15   View Encounter      Received Information PSA, Total (Screen) Order: [id number]  suggestion  Information displayed in this report may not trend or trigger automated decision support.   Component Ref Range & Units 1 mo ago  PSA, Total 0.00 - 4.50 ng/mL 0.42  Comment: Results are obtained using a Beckman Coulter's chemiluminescent immunoassay.  Patient results determined by assays using a different manufacturer and/or method may not be comparable.   Specimen Collected: 01/28/24 09:38   Performed by: [facility](CLIA# [id number]) Last Resulted: 01/28/24 13:31  Received From: [facility]  Result Received: 01/30/24 08:51   View Encounter      Received Information CALR +MPL + E12-E15 (reflexed) Order: [id number]    Component Ref Range & Units (hover) 2 mo ago  CALR Result Comment  Comment: (NOTE) NEGATIVE No insertions or deletions were detected within the analyzed region of the calreticulin (CALR) gene. A negative result does not entirely exclude the possibility of a clonal population carrying CALR gene mutations that are not covered by this assay. Results should be interpreted in conjunction with clinical and laboratory findings for the most accurate interpretation.  MPL Result Comment  Comment: (NOTE) NEGATIVE No MPL mutation was identified in the provided specimen of this individual. Results should be interpreted in conjunction with clinical and other laboratory findings for the most accurate interpretation.  E12-15 Result Comment  Comment: (NOTE) NEGATIVE    JAK2 mutations were not detected in exons 12, 13, 14 and 15. The G to T nucleotide change encoding the V617F mutation was not detected. This result does not rule out the presence of JAK2 mutation at a level below the detection sensitivity of this assay, the presence of other mutations outside the analyzed region of the JAK2 gene, or the  presence of a myeloproliferative or other neoplasm. Result must be correlated with other clinical data for the most accurate diagnosis. Performed At: [facility] [address] [place], [place] [id number] Loran Gales MDPhD Ey:[phone]        Specimen Collected: 01/03/24 12:35 Last Resulted: 01/14/24 11:36     Result Care Coordination   Patient Communication   Add MyChart Message   Seen Back to Top   Good news, no evidence of driver mutations.   Written by Johnston ONEIDA Police, PA-C on 01/14/2024 11:43 AM EDT Seen by patient [name] on 01/14/2024  5:05 PM    Encounters Related to Results  01/14/2024 Results Follow-Up 01/03/2024 Clinical Support (Ordering Encounter) Back to Top  View Full Report JAK2 V617 reflex CALR/MPL/E12-15 Order: [id number]    Component Ref Range & Units (hover) 2 mo ago  Specimen Type Comment:  Comment: NOT PROVIDED  JAK2 V617F Result Comment  Comment: (NOTE) NEGATIVE The JAK2 V617F mutation is not detected in the provided specimen of this individual. Results should be interpreted in conjunction with clinical and other laboratory findings for the most accurate interpretation. This test was developed and its performance characteristics determined by [facility]. It has not been cleared or approved by the Food and Drug Administration.  Reflex Comment  Comment: (NOTE) Reflex to CALR Mutation Analysis, JAK2 Exon 12-15 Mutation Analysis, and MPL Mutation Analysis is indicated.  V617F Rfx CALR/MPL/E12-15 Bkgd Comment  Comment: (NOTE)    Molecular testing of blood or bone marrow  is useful in the evaluation of suspected myeloproliferative neoplasms (MPN). Mutations in the JAK2, MPL, and CALR genes are present in virtually all MPNs and their presence help distinguish benign reactive processes from clonal neoplasms. These mutations are generally considered mutually exclusive, although concurrent clones have been reported in rare patients. This test  will assess for the JAK2V617F (exon 14) mutation first and will reflex to CALR mutation analysis, MPL mutation analysis, and JAK2 exon 12 to 15 mutation analysis if the JAK2V617F mutation is negative.    The JAK2 (Janus kinase 2) gene encodes for a non-receptor protein tyrosine kinase that activates cytokine and growth factor signaling. The V617F (c.1849 G>T) mutation results in constitutive activation of JAK2 and downstream STAT5 and ERK signaling. The V617F mutation is observed in approximately 95% of polycythemia vera (PV), 60% of essential thrombocythemia (ET) and primary myelofibrosis (PMF). It is also infrequently present (3-5%) in myelodysplastic syndrome, chronic myelomonocytic leukemia, and other atypical chronic myeloid disorders. A small percentage of JAK2 mutation positive patients (3.3%) contain other non-V617F mutations within exons 12 to 15. In particular, mutations in exon 12 of JAK2 have been described in approximately 3% of patients with PV. JAK2 allele burden correlates with clinical phenotype, with low levels of mutant allele characterized by thrombocytosis, intermediate levels with erythrocytosis, and high mutant allele burden correlating with enhanced myelopoiesis of the BM, leukocytosis, increasing spleen size, and circulating CD34-positive cells.    The CALR (Calreticulin) gene encodes for a multifunctional calcium -binding protein involved in many cellular activities such as growth, proliferation, adhesion, and programmed cell death. Among patients with JAK2 negative MPNs, CALR are found in approximately 70% of patients with JAK2-negative essential thrombocythemia (ET) and 60-88% of patients with JAK2-negative primary myelofibrosis(PMF). Only a minority of patients (approximately 8%) with myelodysplasia have mutations in the CALR gene. CALR mutations are rarely detected in patients with de novo acute myeloid leukemia, chronic myelogenous leukemia,  lymphoid leukemia, or solid tumors. CALR mutations are not detected in polycythemia and generally appear to be mutually exclusive with JAK2 mutations and MPL mutations. The majority of mutational changes involve a variety of insertion deletion mutations in exon 9 of the calreticulin gene: approximately 53% of all CALR mutations are a 52 bp deletion (type-1) while the second most prevalent mutation (approximately 32%) contains a 5 bp insertion (type-2). Other mutations (non-type 1 or type 2) are seen in a small minority of cases. CALR mutations in PMF tend to be with a favorable prognosis compared to JAK2 V617F mutations, whereas primary myelofibrosis negative for CALR, JAK2 V617F and MPL mutations (so-called triple negative) is associated with a poor prognosis and shorter survival.    The MPL (myeloproliferative leukemia virus oncogene) gene encodes the thrombopoietin receptor which regulates hematopoiesis and megakaryopoiesis. Activating MPL mutations are associated with a subset of myeloproliferative neoplasms and acute megakaryoblastic leukemia. MPL W515 mutations are present in approximately 5-8% of patients with primary myelofibrosis (PMF) and 1-4% of patients with essential thrombocythemia (ET). The S505 mutation is detected in patients with hereditary thrombocythemia.    Limitations    This assay has a sensitivity of approximately 1% VAF for JAK2 V617F, 2.5% VAF for other mutations in JAK2 exons 12 to 15, CALR mutations, and MPL mutations. Deletions in JAK2 up to 6 bp and insertions up to 34 bp have been detected in validation studies. Deletions in CALR up to 70 bp and insertions up to 12 bp have been detected in validation studies.  Method based next generation sequencing.  Comment: Comment  Amplicon  References Comment  Comment: (NOTE) Alghasham N, Alnouri Y, Abalkhail VEAR Collier RAMAN. Detection of mutations in JAK2 exons 12-15 by [employer] sequencing. Int J Lab Hematol. 2016  Feb;38(1):34-41. doi: 10.1111/ijlh.87574. Epub 2015 Sep 11. PMID: 73638915. Glorine ISLE, Cheryll LABOR, Hasserjian R, Omega PARAS, Borowitz MJ, Ladora Campanile MM, [place]ston CD, Cazzola M, Vardiman JW. The 2016 revision to the World Health Organization classification of myeloid neoplasms and acute leukemia. Blood. 2016 May 19;127(20):2391-405. doi: 10.1182/blood-2016-03-643544. Epub 2016 Apr 11. PMID: 72930745. Honor LELON Glennie VEAR Laurita OLEGARIO Halford [facility], Zhang ZJ, Burley S, Albitar M. Mutation profile of JAK2 transcripts in patients with chronic myeloproliferative neoplasias. J Mol Diagn. 2009 Jan;11(1):49-53.doi: 10.2353/jmoldx.2009.080114. Epub 2008 Dec 12. PMID: 80925404; PMCID: EFR7392434. NCCN Clinical Practice Guidelines in Oncology (NCCN Guidelines) Myeloproliferative Neoplasms Version 3.2022 - February 17, 2021. Swerdlow SH, Programmer, multimedia. WHO classification of Tumours of Haematopoietic and Lymphoid Tissues. 4th edn. Epifanio, [place]: Geologist, engineering for [employer] on [employer]; 2017. Tefferi A. Primary myelofibrosis: 2021 update on diagnosis, risk-stratification and management. Am J Hematol. 2021 Jan;96(1):145-162. doi: 10.1002/ajh.26050. Epub 2020 Dec 2. PMID: 66802950. Vainchenker W, Kralovics R. Genetic basis and molecular pathophysiology of classical myeloproliferative neoplasms. Blood. 2017 Feb 9;129(6):667-679. doi: 10.1182/blood-2016-10-695940. Epub 2016 Dec 27. PMID: 71971970.  Director Review Comment  Comment: (NOTE) Technical Component performed at [facility] Professional Component performed by: Rolan Flatten, PhD, [facility] Director, Molecular Oncology [facility] [phone], [address] [place] [address] [phone] Performed At: [facility] [address] [place], [place] [phone] Loran Gales MDPhD Ey:[phone] Performed At: [facility] [address] [place], [place] [phone] Loran Gales MDPhD Ey:[phone]        Specimen Collected: 01/03/24 12:35  Last Resulted: 01/14/24 11:36     Result Care Coordination   Patient Communication   Add MyChart Message   Add Notifications  Back to Top   View Full Report BCR-ABL1 FISH Order: [id number]    Component Ref Range & Units (hover) 2 mo ago  Specimen Type BLOOD  Cells Counted 200  Cells Analyzed 200  FISH Result ABL1 GENE FUSION  Comment: Comment: NO BCR  Interpretation Comment:  Comment: (NOTE) NEGATIVE             nuc ish 9q34(ASS1,ABL1)x2,22q11.2(BCRx2)[200].      The fluorescence in situ hybridization (FISH) study was normal. FISH, using unique sequence DNA probes for the ABL1 and BCR gene regions showed two ABL1 signals (red), two control ASS1 gene signals (aqua) located adjacent to the ABL1 locus at 9q34, and two BCR signals (green) at 22q11.2 in all interphase nuclei examined. There was NO evidence of CML or ALL-associated BCR::ABL1 dual fusion signals in this analysis. .      In those cases where blood is submitted for BCR::ABL1 FISH, the results should be interpreted in conjunction with PMN ratios. .      Chromosome analysis should be considered to identify clonal alterations not targeted by the FISH probes ordered. The TargetGene FISH result should be interpreted within the context of a full hematopatholgy evaluation. The DNA probe vendor for this study was [employer] worker, community).        This test was developed and its performance characteristics determined by [employer] [facility] [employer]). It has not been cleared or approved by the U.S. Food and Drug Administration.  Director Review: Comment:  Comment: (NOTE) ALFONSO SHUTTER, PHD, Professional Component performed by, [employer], [facility] [employer], [place] [id number], [address],  [place], [place] [address]. Medical Director, Aniceto Christen, MD,PhD Performed At: [facility] [address] [place], [place] [id number] Christen Aniceto MDPhD Ey:[phone]         Specimen Collected: 01/03/24 12:35 Last Resulted: 01/10/24 19:35     Result Care Coordination   Patient Communication   Add MyChart Message   Seen Back to Top   View Full Report Thyroid  Panel With TSH Order: [id number]    Component Ref Range & Units (hover) 2 mo ago  TSH 4.440  T4, Total 7.0  T3 Uptake Ratio 29  Free Thyroxine Index 2.0         Narrative  Performed at:  [address] [place], [place]  [id number] Lab Director: Frankey Sas MD, Phone:  [phone]  Specimen Collected: 01/02/24 11:49 Last Resulted: 01/03/24 04:35     Result Care Coordination  ASSESSMENT & PLAN:  Andon Villard is a 63 y.o. male who presents to the clinic for continued management of MGUS.  Wonderful 63 y.o. male with:  1.  IgG kappa MGUS (monoclonal gammopathy of unknown significance) M spike of 0.3 g/dL and 10/15/7973 Last myeloma panel on 12/26/2023 showed no detectable M spike.  IFE showed polyclonal increase in 1 or more immunoglobulins with a slightly elevated IgA level of 798.  2. Leukocytosis Patient has had a WBC count varying between 12.2 nearly 20k. WBC count is persistently elevated since at least May 2024. Primarily noted to have neutrophilia and also some element of persistent monocytosis as well as some increased immature granulocytes. His JAK2 mutation, CAL reticulin mutation: MPL mutation and BCR-ABL have been negative. Associated with thrombocytosis 3. Essential hypertension 4. Anxiety and depression 5. Morbid obesity 6. OSA on CPAP 7.Iron  deficiency and B12 deficiency related to Gastric bypass surgery  PLAN: - Labs from today 03/17/2024 were discussed in detail with the patient. - CBC shows persistent chronic leukocytosis with a WBC count of 18.5k, normal hemoglobin of 14.3 and persistently elevated platelets of 500k primary noted to have neutrophilia and monocytosis. - We discussed that his leukocytosis could be reactive from obesity,  inflammation and other etiologies however with persistent monocytosis and neutrophilia as well as thrombocytosis there is a concern for chronic MPN or MPN/MDS syndrome like CMML -Patient was planning to have possible knee surgery and would like to have a more definitive understanding of his hematologic situation for operative clearance. - We discussed and patient is agreeable to get a bone marrow aspiration and biopsy to evaluate for MPN versus MPN/MDS syndrome. - From MGUS standpoint his last SPEP in June showed no evidence of monoclonal protein spike suggesting it could have been reactive. - Ferritin still remains low at 14 with an iron  saturation of 7%. - Given poor absorption of p.o. iron  due to his gastric bypass surgery we shall give him an option for additional IV iron  replacement at this time especially in the context of upcoming surgery. - Continue sublingual B12 1000 mcg daily  FOLLOW UP: -CT-guided bone marrow aspiration biopsy in a week - IV Monoferric 1000 mg x 1 dose for iron  replacement. - Return to clinic with Dr. Onesimo in 3 weeks     Orders Placed This Encounter  Procedures   IR BONE MARROW BIOPSY & ASPIRATION    Concern for CMML.  Patient is having left knee replacement in 3 weeks.  Hoping to get the bone marrow biopsy as soon as possible for preoperative evaluation.    Standing Status:  Future    Number of Occurrences:   1    Expected Date:   03/21/2024    Expiration Date:   03/17/2025    Reason for Exam (SYMPTOM  OR DIAGNOSIS REQUIRED):   Unilateral bone marrow aspiration and biopsy to evaluate neutrophilia/monocytosis plus thrombocytosis.  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  All of the patient's questions were answered with apparent satisfaction. The patient knows to call the clinic with any problems, questions or concerns.   Emaline Saran MD MS AAHIVMS [facility]  [facility] Hematology/Oncology Physician [facility]  .*Total Encounter Time as defined by the Centers for Medicare and Medicaid Services includes, in addition to the face-to-face time of a patient visit (documented in the note above) non-face-to-face time: obtaining and reviewing outside history, ordering and reviewing medications, tests or procedures, care coordination (communications with other health care professionals or caregivers) and documentation in the medical record.

## 2024-03-28 ENCOUNTER — Telehealth: Payer: Self-pay | Admitting: Internal Medicine

## 2024-03-28 ENCOUNTER — Telehealth (HOSPITAL_COMMUNITY): Payer: Self-pay

## 2024-03-28 ENCOUNTER — Ambulatory Visit: Payer: Self-pay | Admitting: Internal Medicine

## 2024-03-28 ENCOUNTER — Other Ambulatory Visit: Payer: Self-pay

## 2024-03-28 ENCOUNTER — Other Ambulatory Visit (HOSPITAL_COMMUNITY): Payer: Self-pay | Admitting: Pharmacy Technician

## 2024-03-28 DIAGNOSIS — R627 Adult failure to thrive: Secondary | ICD-10-CM

## 2024-03-28 DIAGNOSIS — I4891 Unspecified atrial fibrillation: Secondary | ICD-10-CM

## 2024-03-28 DIAGNOSIS — I10 Essential (primary) hypertension: Secondary | ICD-10-CM

## 2024-03-28 LAB — SURGICAL PATHOLOGY

## 2024-03-28 LAB — CBC
Hematocrit: 45.8 % (ref 37.5–51.0)
Hemoglobin: 14 g/dL (ref 13.0–17.7)
MCH: 25.3 pg — ABNORMAL LOW (ref 26.6–33.0)
MCHC: 30.6 g/dL — ABNORMAL LOW (ref 31.5–35.7)
MCV: 83 fL (ref 79–97)
Platelets: 555 x10E3/uL — ABNORMAL HIGH (ref 150–450)
RBC: 5.53 x10E6/uL (ref 4.14–5.80)
RDW: 16.1 % — ABNORMAL HIGH (ref 11.6–15.4)
WBC: 15.4 x10E3/uL — ABNORMAL HIGH (ref 3.4–10.8)

## 2024-03-28 LAB — COMPREHENSIVE METABOLIC PANEL WITH GFR
ALT: 15 IU/L (ref 0–44)
AST: 18 IU/L (ref 0–40)
Albumin: 3.9 g/dL (ref 3.9–4.9)
Alkaline Phosphatase: 107 IU/L (ref 47–123)
BUN/Creatinine Ratio: 15 (ref 10–24)
BUN: 10 mg/dL (ref 8–27)
Bilirubin Total: 0.3 mg/dL (ref 0.0–1.2)
CO2: 23 mmol/L (ref 20–29)
Calcium: 8.9 mg/dL (ref 8.6–10.2)
Chloride: 99 mmol/L (ref 96–106)
Creatinine, Ser: 0.65 mg/dL — AB (ref 0.76–1.27)
Globulin, Total: 2.9 g/dL (ref 1.5–4.5)
Glucose: 129 mg/dL — AB (ref 70–99)
Potassium: 5.3 mmol/L — AB (ref 3.5–5.2)
Sodium: 138 mmol/L (ref 134–144)
Total Protein: 6.8 g/dL (ref 6.0–8.5)
eGFR: 105 mL/min/1.73 (ref >59)

## 2024-03-28 LAB — DIGOXIN LEVEL: Digoxin, Serum: 0.6 ng/mL (ref 0.5–0.9)

## 2024-03-28 LAB — TSH RFX ON ABNORMAL TO FREE T4: TSH: 4.01 u[IU]/mL (ref 0.450–4.500)

## 2024-03-28 NOTE — Telephone Encounter (Signed)
 Pts wife requesting a c/b in regards to upcoming blood work

## 2024-03-28 NOTE — Telephone Encounter (Signed)
 Patient's wife calling in due to reading results from patient's recent blood work. She states that it was requested that he have blood drawn again in one week. She states that they are unable to drive and rely on transportation for all of their appointments. She states that it cost them around $100 every time. She would like to know if its okay for him to have his blood redrawn when he comes back for his appointment on 10/17 so that he doesn't have to make two trips out here. He is also having surgery on 10/6 so they may be doing blood work prior to that procedure. Advised that would be fine.

## 2024-03-28 NOTE — Telephone Encounter (Signed)
 Auth Submission: NO AUTH NEEDED Site of care: Site of care: [facility] Payer: [employer] Medication & CPT/J Code(s) submitted: Monoferric (Ferrci derisomaltose) [phone] Diagnosis Code: D50.9 Route of submission (phone, fax, portal):  Phone # Fax # Auth type: Buy/Bill HB Units/visits requested: 1000mg  x 1 dose Reference number:  Approval from: 03/28/24 to 06/27/24

## 2024-03-30 ENCOUNTER — Other Ambulatory Visit (HOSPITAL_COMMUNITY): Payer: Self-pay

## 2024-03-31 ENCOUNTER — Other Ambulatory Visit (HOSPITAL_COMMUNITY): Payer: Self-pay

## 2024-04-01 ENCOUNTER — Encounter (HOSPITAL_COMMUNITY): Payer: Self-pay | Admitting: Hematology

## 2024-04-01 ENCOUNTER — Other Ambulatory Visit: Payer: Self-pay

## 2024-04-01 ENCOUNTER — Other Ambulatory Visit (HOSPITAL_COMMUNITY): Payer: Self-pay

## 2024-04-01 MED ORDER — TESTOSTERONE CYPIONATE 200 MG/ML IM SOLN
200.0000 mg | INTRAMUSCULAR | 2 refills | Status: AC
  Filled 2024-04-07 – 2024-04-10 (×2): qty 2, 28d supply, fill #0
  Filled 2024-04-21 – 2024-05-29 (×2): qty 2, 28d supply, fill #1
  Filled 2024-06-21 – 2024-07-07 (×2): qty 2, 28d supply, fill #2

## 2024-04-01 NOTE — Telephone Encounter (Signed)
 Spoke with pt's wife who was wondering if pt could get BMP completed with pre-procedure labs in [facility] tomorrow. Explained that that is fine to do, we would just need a fax number from the [facility] facility to fax the order to if they won't put the BMP order in without an order from our office first. Explained that if they do add the BMP to their lab orders, they will just need to have the results faxed to our office at [phone]. Pt's wife verbalized understanding of plan and had no further questions at this time.

## 2024-04-02 ENCOUNTER — Encounter: Payer: Self-pay | Admitting: Internal Medicine

## 2024-04-02 ENCOUNTER — Encounter: Payer: Self-pay | Admitting: Hematology

## 2024-04-03 ENCOUNTER — Encounter (HOSPITAL_COMMUNITY): Payer: Self-pay

## 2024-04-03 ENCOUNTER — Other Ambulatory Visit (HOSPITAL_COMMUNITY): Payer: Self-pay

## 2024-04-03 MED ORDER — MUPIROCIN 2 % EX OINT
TOPICAL_OINTMENT | CUTANEOUS | 0 refills | Status: DC
  Filled 2024-04-03: qty 22, 10d supply, fill #0

## 2024-04-07 ENCOUNTER — Other Ambulatory Visit (HOSPITAL_COMMUNITY): Payer: Self-pay

## 2024-04-07 ENCOUNTER — Encounter (HOSPITAL_COMMUNITY): Payer: Self-pay | Admitting: Hematology

## 2024-04-08 ENCOUNTER — Encounter (HOSPITAL_COMMUNITY): Payer: Self-pay

## 2024-04-10 ENCOUNTER — Ambulatory Visit: Admitting: Internal Medicine

## 2024-04-10 ENCOUNTER — Other Ambulatory Visit: Payer: Self-pay

## 2024-04-10 ENCOUNTER — Other Ambulatory Visit (HOSPITAL_COMMUNITY): Payer: Self-pay

## 2024-04-10 ENCOUNTER — Ambulatory Visit (HOSPITAL_COMMUNITY)
Admission: RE | Admit: 2024-04-10 | Discharge: 2024-04-10 | Disposition: A | Discharge status: Home / Self Care | Source: Ambulatory Visit | Attending: Hematology | Admitting: Hematology

## 2024-04-10 VITALS — BP 145/74 | HR 89 | Temp 97.4°F | Resp 17

## 2024-04-10 DIAGNOSIS — D509 Iron deficiency anemia, unspecified: Secondary | ICD-10-CM | POA: Diagnosis present

## 2024-04-10 MED ORDER — ACETAMINOPHEN 325 MG PO TABS: 650.0000 mg | ORAL_TABLET | Freq: Once | ORAL | Status: DC

## 2024-04-10 MED ORDER — LORATADINE 10 MG PO TABS: 10.0000 mg | ORAL_TABLET | Freq: Once | ORAL | Status: DC

## 2024-04-10 MED ORDER — SODIUM CHLORIDE 0.9 % IV SOLN
1000.0000 mg | Freq: Once | INTRAVENOUS | Status: AC
  Administered 2024-04-10: 1000 mg via INTRAVENOUS
  Filled 2024-04-10 (×2): qty 10

## 2024-04-11 ENCOUNTER — Other Ambulatory Visit (HOSPITAL_COMMUNITY): Payer: Self-pay

## 2024-04-14 ENCOUNTER — Other Ambulatory Visit: Payer: Self-pay

## 2024-04-14 ENCOUNTER — Other Ambulatory Visit (HOSPITAL_COMMUNITY): Payer: Self-pay

## 2024-04-14 MED ORDER — OXYCODONE-ACETAMINOPHEN 5-325 MG PO TABS
1.0000 | ORAL_TABLET | Freq: Two times a day (BID) | ORAL | 0 refills | Status: AC | PRN
  Filled 2024-04-14 – 2024-04-15 (×2): qty 45, 23d supply, fill #0

## 2024-04-15 ENCOUNTER — Other Ambulatory Visit: Payer: Self-pay

## 2024-04-15 ENCOUNTER — Encounter (HOSPITAL_COMMUNITY): Payer: Self-pay | Admitting: Hematology

## 2024-04-15 ENCOUNTER — Other Ambulatory Visit (HOSPITAL_COMMUNITY): Payer: Self-pay

## 2024-04-15 MED ORDER — ASPIRIN 325 MG PO TBEC
325.0000 mg | DELAYED_RELEASE_TABLET | Freq: Two times a day (BID) | ORAL | 0 refills | Status: DC
  Filled 2024-04-15 (×2): qty 60, 30d supply, fill #0

## 2024-04-15 MED ORDER — GLIPIZIDE ER 5 MG PO TB24
ORAL_TABLET | ORAL | 1 refills | Status: AC
  Filled 2024-04-15: qty 90, 90d supply, fill #0
  Filled 2024-07-07: qty 90, 90d supply, fill #1

## 2024-04-15 MED ORDER — SENNOSIDES-DOCUSATE SODIUM 8.6-50 MG PO TABS
1.0000 | ORAL_TABLET | Freq: Two times a day (BID) | ORAL | 0 refills | Status: DC
  Filled 2024-04-15: qty 60, 30d supply, fill #0

## 2024-04-15 MED ORDER — CELECOXIB 200 MG PO CAPS
200.0000 mg | ORAL_CAPSULE | Freq: Two times a day (BID) | ORAL | 0 refills | Status: AC
  Filled 2024-04-15 (×2): qty 60, 30d supply, fill #0

## 2024-04-15 MED ORDER — OXYCODONE HCL 5 MG PO TABS
5.0000 mg | ORAL_TABLET | ORAL | 0 refills | Status: AC | PRN
  Filled 2024-04-15 (×2): qty 42, 7d supply, fill #0

## 2024-04-15 MED ORDER — POLYETHYLENE GLYCOL 3350 17 G PO PACK
17.0000 g | PACK | Freq: Every day | ORAL | 0 refills | Status: DC
  Filled 2024-04-15: qty 14, 14d supply, fill #0

## 2024-04-15 MED ORDER — ONDANSETRON HCL 4 MG PO TABS
4.0000 mg | ORAL_TABLET | ORAL | 0 refills | Status: DC | PRN
  Filled 2024-04-15 (×2): qty 30, 5d supply, fill #0

## 2024-04-17 ENCOUNTER — Other Ambulatory Visit: Payer: Self-pay

## 2024-04-21 ENCOUNTER — Other Ambulatory Visit

## 2024-04-21 ENCOUNTER — Ambulatory Visit: Admitting: Hematology

## 2024-04-21 ENCOUNTER — Other Ambulatory Visit (HOSPITAL_COMMUNITY): Payer: Self-pay

## 2024-04-21 MED ORDER — LISINOPRIL 10 MG PO TABS
10.0000 mg | ORAL_TABLET | Freq: Every day | ORAL | 1 refills | Status: DC
  Filled 2024-04-21 – 2024-04-28 (×7): qty 30, 30d supply, fill #0

## 2024-04-21 NOTE — Telephone Encounter (Signed)
 Patient rescheduled for 11/11 with APP.

## 2024-04-22 ENCOUNTER — Encounter (HOSPITAL_COMMUNITY): Payer: Self-pay

## 2024-04-22 ENCOUNTER — Other Ambulatory Visit (HOSPITAL_COMMUNITY): Payer: Self-pay

## 2024-04-23 ENCOUNTER — Other Ambulatory Visit (HOSPITAL_COMMUNITY): Payer: Self-pay

## 2024-04-24 ENCOUNTER — Other Ambulatory Visit: Payer: Self-pay

## 2024-04-24 ENCOUNTER — Other Ambulatory Visit (HOSPITAL_COMMUNITY): Payer: Self-pay

## 2024-04-25 ENCOUNTER — Ambulatory Visit: Admitting: Physician Assistant

## 2024-04-26 ENCOUNTER — Other Ambulatory Visit (HOSPITAL_COMMUNITY): Payer: Self-pay

## 2024-04-28 ENCOUNTER — Other Ambulatory Visit: Payer: Self-pay

## 2024-04-28 ENCOUNTER — Encounter (HOSPITAL_COMMUNITY): Payer: Self-pay | Admitting: Hematology

## 2024-04-28 ENCOUNTER — Other Ambulatory Visit (HOSPITAL_COMMUNITY): Payer: Self-pay

## 2024-04-29 ENCOUNTER — Encounter (HOSPITAL_COMMUNITY): Payer: Self-pay

## 2024-04-29 ENCOUNTER — Other Ambulatory Visit (HOSPITAL_COMMUNITY): Payer: Self-pay

## 2024-04-29 ENCOUNTER — Other Ambulatory Visit: Payer: Self-pay

## 2024-04-29 ENCOUNTER — Encounter: Payer: Self-pay | Admitting: Pharmacist

## 2024-04-29 MED ORDER — OXYCODONE HCL 5 MG PO TABS
5.0000 mg | ORAL_TABLET | ORAL | 0 refills | Status: AC
  Filled 2024-04-29: qty 42, 7d supply, fill #0

## 2024-04-30 ENCOUNTER — Other Ambulatory Visit: Payer: Self-pay

## 2024-05-02 ENCOUNTER — Other Ambulatory Visit (HOSPITAL_COMMUNITY): Payer: Self-pay

## 2024-05-02 ENCOUNTER — Other Ambulatory Visit: Payer: Self-pay

## 2024-05-05 ENCOUNTER — Encounter (HOSPITAL_COMMUNITY): Payer: Self-pay

## 2024-05-05 ENCOUNTER — Other Ambulatory Visit (HOSPITAL_COMMUNITY): Payer: Self-pay

## 2024-05-05 ENCOUNTER — Ambulatory Visit: Admitting: Nurse Practitioner

## 2024-05-12 ENCOUNTER — Other Ambulatory Visit (HOSPITAL_COMMUNITY): Payer: Self-pay

## 2024-05-17 ENCOUNTER — Other Ambulatory Visit (HOSPITAL_COMMUNITY): Payer: Self-pay

## 2024-05-19 ENCOUNTER — Other Ambulatory Visit: Payer: Self-pay

## 2024-05-20 ENCOUNTER — Other Ambulatory Visit (HOSPITAL_COMMUNITY): Payer: Self-pay

## 2024-05-20 ENCOUNTER — Encounter: Payer: Self-pay | Admitting: Physician Assistant

## 2024-05-20 ENCOUNTER — Encounter (HOSPITAL_COMMUNITY): Payer: Self-pay

## 2024-05-20 ENCOUNTER — Ambulatory Visit: Attending: Physician Assistant | Admitting: Physician Assistant

## 2024-05-20 VITALS — BP 118/70 | HR 101 | Ht 70.0 in | Wt 353.2 lb

## 2024-05-20 DIAGNOSIS — I4891 Unspecified atrial fibrillation: Secondary | ICD-10-CM

## 2024-05-20 DIAGNOSIS — I4892 Unspecified atrial flutter: Secondary | ICD-10-CM

## 2024-05-20 NOTE — Progress Notes (Unsigned)
 Cardiology Office Note   Date:  05/20/2024  ID:  Cody Hampton, DOB [birth year], MRN [id number] PCP: Lazoff, Shawn P, DO  [facility] Providers Cardiologist:  Soyla DELENA Merck, MD Cardiology APP:  Terra Fairy PARAS, PA-C { Click to update primary MD,subspecialty MD or APP then REFRESH:1}    History of Present Illness Cody Hampton is a 63 y.o. male with PMH of atrial fibrillation/atrial flutter, OSA, history of gastric bypass, hypertension, morbid obesity and DM2.  Patient was diagnosed with new onset of atrial flutter with rapid ventricular response in December 2024 and underwent successful cardioversion but later had returned to atrial fibrillation with controlled ventricular response.  He was started on Eliquis  and the Lopressor .  Heart monitor obtained in January 2025 showed no atrial fibrillation, 3.5% PAC burden, rare PVC and and no other sustained arrhythmia.  Echocardiogram at the time showed EF 60 to 65%.  He has stopped Eliquis  earlier this year believing it was affecting his chemistry.  He was seen in the ED in March 2025 with atrial flutter with RVR and was restarted on Eliquis  and transition to bisoprolol  due to fatigue attributed to metoprolol .  He was readmitted in March 2025 with GI bleed and underwent endoscopy that did not reveal the source of bleeding.  He was restarted on Eliquis  prior to discharge.  Bisoprolol  was later stopped and moved to diltiazem .  He was back in atrial flutter with RVR in April 2025 and consented for TEE DCCV.  Unfortunately he presented back to the hospital with dark stool and weakness.  Hemoglobin was 8.8.  Eliquis  was held.  He was treated with ibuprofen and digoxin .  Another EGD showed no source of bleeding again.  He was restarted on Eliquis  and eventually underwent TEE guided DCCV on 10/29/2023.  TEE obtained on 10/29/2023 showed EF 55 to 60%, no evidence of LA appendage thrombus, trivial MR.  He was discharged on digoxin , diltiazem  and  Eliquis .  His Eliquis  was eventually stopped by A-fib clinic 4 weeks after the last cardioversion due to low CHA2DS2-Vasc of 1.  He was last seen as an add-on on 03/27/2024 due to failure to thrive.  It was felt his symptom worsened after he started on digoxin  and diltiazem .  Dr. Wendel recommended digoxin  and a diltiazem  holiday.  Both medication has been discontinued.  Blood work showed white blood cell count of 15.4, hemoglobin 14.0.  CMP showed potassium of 5.3.  Digoxin  level 0.6.  TSH normal.  Since the last visit, patient underwent knee surgery on 04/14/2024.  Patient presents today for follow-up.  He used to be on lisinopril , however this was taken off as well.  Both blood pressure and the heart rate are reasonable.  Although initial heart rate was 101, however on physical exam, his heart rate is quite regular.  He has no lower extremity edema.  He is still going through physical therapy after recent knee replacement surgery.  He denies any chest pain.  I will obtain EKG to make sure he is in sinus rhythm overall patient is very stable and can follow-up in 6 months  ROS: ***  Studies Reviewed      *** Risk Assessment/Calculations {Does this patient have ATRIAL FIBRILLATION?:[phone]}         Physical Exam VS:  BP 118/70   Pulse (!) 101   Ht 5' 10 (1.778 m)   Wt (!) 353 lb 3.2 oz (160.2 kg)   SpO2 96%   BMI 50.68 kg/m  Wt Readings from Last 3 Encounters:  05/20/24 (!) 353 lb 3.2 oz (160.2 kg)  03/27/24 (!) 357 lb (161.9 kg)  03/25/24 (!) 359 lb (162.8 kg)    GEN: Well nourished, well developed in no acute distress NECK: No JVD; No carotid bruits CARDIAC: ***RRR, no murmurs, rubs, gallops RESPIRATORY:  Clear to auscultation without rales, wheezing or rhonchi  ABDOMEN: Soft, non-tender, non-distended EXTREMITIES:  No edema; No deformity   ASSESSMENT AND PLAN ***    {Are you ordering a CV Procedure (e.g. stress test, cath, DCCV, TEE, etc)?   Press F2         :[id number]}  Dispo: ***  Signed, Scot Ford, PA

## 2024-05-20 NOTE — Patient Instructions (Signed)
 Medication Instructions:  Your physician recommends that you continue on your current medications as directed. Please refer to the Current Medication list given to you today.  *If you need a refill on your cardiac medications before your next appointment, please call your pharmacy*  Lab Work: NONE If you have labs (blood work) drawn today and your tests are completely normal, you will receive your results only by: MyChart Message (if you have MyChart) OR A paper copy in the mail If you have any lab test that is abnormal or we need to change your treatment, we will call you to review the results.  Testing/Procedures: NONE  Follow-Up: At [facility], you and your health needs are our priority.  As part of our continuing mission to provide you with exceptional heart care, our providers are all part of one team.  This team includes your primary Cardiologist (physician) and Advanced Practice Providers or APPs (Physician Assistants and Nurse Practitioners) who all work together to provide you with the care you need, when you need it.  Your next appointment:   6 month(s)  Provider:   Gayatri A Acharya, MD We recommend signing up for the patient portal called MyChart.  Sign up information is provided on this After Visit Summary.  MyChart is used to connect with patients for Virtual Visits (Telemedicine).  Patients are able to view lab/test results, encounter notes, upcoming appointments, etc.  Non-urgent messages can be sent to your provider as well.   To learn more about what you can do with MyChart, go to [email or web address].

## 2024-05-22 ENCOUNTER — Other Ambulatory Visit (HOSPITAL_COMMUNITY): Payer: Self-pay

## 2024-05-23 ENCOUNTER — Ambulatory Visit (HOSPITAL_COMMUNITY): Admitting: Internal Medicine

## 2024-05-29 ENCOUNTER — Other Ambulatory Visit (HOSPITAL_COMMUNITY): Payer: Self-pay

## 2024-05-29 ENCOUNTER — Other Ambulatory Visit: Payer: Self-pay

## 2024-05-29 ENCOUNTER — Encounter (HOSPITAL_COMMUNITY): Payer: Self-pay | Admitting: Hematology

## 2024-05-30 ENCOUNTER — Other Ambulatory Visit: Payer: Self-pay

## 2024-05-30 ENCOUNTER — Other Ambulatory Visit (HOSPITAL_COMMUNITY): Payer: Self-pay

## 2024-06-01 ENCOUNTER — Other Ambulatory Visit (HOSPITAL_COMMUNITY): Payer: Self-pay

## 2024-06-02 ENCOUNTER — Other Ambulatory Visit (HOSPITAL_COMMUNITY): Payer: Self-pay

## 2024-06-04 ENCOUNTER — Other Ambulatory Visit (HOSPITAL_COMMUNITY): Payer: Self-pay

## 2024-06-10 NOTE — Progress Notes (Signed)
°  PVR  Date/Time: 06/10/2024 1:30 PM  Performed by: Damien ONEIDA Bright, CMA Authorized by: Geofm Almarie Barter, MD   Procedure Details    Procedure: bladder scan     PVR Details:     Residual urine (mL): 9     Damien ONEIDA Bright, CMA

## 2024-06-12 ENCOUNTER — Other Ambulatory Visit (HOSPITAL_COMMUNITY): Payer: Self-pay

## 2024-06-12 MED ORDER — GEMTESA 75 MG PO TABS
75.0000 mg | ORAL_TABLET | Freq: Every day | ORAL | 11 refills | Status: AC
  Filled 2024-06-12 – 2024-06-26 (×4): qty 30, 30d supply, fill #0
  Filled 2024-07-11 – 2024-07-20 (×2): qty 30, 30d supply, fill #1

## 2024-06-16 ENCOUNTER — Other Ambulatory Visit (HOSPITAL_COMMUNITY): Payer: Self-pay

## 2024-06-19 ENCOUNTER — Other Ambulatory Visit (HOSPITAL_COMMUNITY): Payer: Self-pay

## 2024-06-21 ENCOUNTER — Other Ambulatory Visit (HOSPITAL_COMMUNITY): Payer: Self-pay

## 2024-06-23 ENCOUNTER — Other Ambulatory Visit: Payer: Self-pay

## 2024-06-23 ENCOUNTER — Other Ambulatory Visit (HOSPITAL_COMMUNITY): Payer: Self-pay

## 2024-06-24 ENCOUNTER — Other Ambulatory Visit: Payer: Self-pay

## 2024-06-25 ENCOUNTER — Encounter: Payer: Self-pay | Admitting: Pharmacist

## 2024-06-25 ENCOUNTER — Other Ambulatory Visit: Payer: Self-pay

## 2024-06-26 ENCOUNTER — Other Ambulatory Visit (HOSPITAL_COMMUNITY): Payer: Self-pay

## 2024-06-30 ENCOUNTER — Other Ambulatory Visit: Payer: Self-pay

## 2024-07-01 ENCOUNTER — Other Ambulatory Visit (HOSPITAL_COMMUNITY): Payer: Self-pay

## 2024-07-07 ENCOUNTER — Other Ambulatory Visit (HOSPITAL_COMMUNITY): Payer: Self-pay

## 2024-07-11 ENCOUNTER — Other Ambulatory Visit: Payer: Self-pay

## 2024-07-16 NOTE — Telephone Encounter (Signed)
 Patient HST is scheduled for 07/29/24..  The auth we had has expired.  HST HTA pending new auth

## 2024-07-21 ENCOUNTER — Other Ambulatory Visit (HOSPITAL_COMMUNITY): Payer: Self-pay

## 2024-07-21 ENCOUNTER — Other Ambulatory Visit (HOSPITAL_BASED_OUTPATIENT_CLINIC_OR_DEPARTMENT_OTHER): Payer: Self-pay

## 2024-07-21 ENCOUNTER — Other Ambulatory Visit: Payer: Self-pay

## 2024-07-22 ENCOUNTER — Other Ambulatory Visit (HOSPITAL_COMMUNITY): Payer: Self-pay

## 2024-07-22 ENCOUNTER — Other Ambulatory Visit: Payer: Self-pay

## 2024-07-24 NOTE — Telephone Encounter (Signed)
 Updated Cody Hampton shara: [id number] (exp. 07/16/24 to 10/14/24)

## 2024-07-29 ENCOUNTER — Ambulatory Visit: Admitting: Neurology

## 2024-07-29 DIAGNOSIS — G4733 Obstructive sleep apnea (adult) (pediatric): Secondary | ICD-10-CM | POA: Diagnosis not present

## 2024-07-29 DIAGNOSIS — Z789 Other specified health status: Secondary | ICD-10-CM

## 2024-07-29 DIAGNOSIS — G4719 Other hypersomnia: Secondary | ICD-10-CM

## 2024-08-01 ENCOUNTER — Ambulatory Visit: Payer: Self-pay | Admitting: Neurology

## 2024-08-01 DIAGNOSIS — G4733 Obstructive sleep apnea (adult) (pediatric): Secondary | ICD-10-CM

## 2024-08-01 NOTE — Procedures (Signed)
 "   [facility]  HOME SLEEP TEST (Watch PAT) REPORT  STUDY DATE: 07/30/2024  DOB: March 29, 1961  MRN: [id number]  ORDERING CLINICIAN: True Mar, MD, PhD   REFERRING CLINICIAN: Lazoff, Elouise SQUIBB, DO   CLINICAL INFORMATION/HISTORY (obtained from visit note dated 02/25/2024): 64 year old male with an underlying medical history of hypertension, arthritis, depression, atrial flutter/A-fib, status post cardioversion, history of GI bleed, iron  deficiency anemia, MGUS, osteoarthritis, vitamin B12 deficiency, thrombocytosis, hearing loss, and morbid obesity with a BMI of over 50, who was previously diagnosed with obstructive sleep apnea and placed on PAP therapy.  He has not used his CPAP of 14 cm consistently.  He presents for reevaluation.  He may be eligible for a new machine.    BMI: 52.6 kg/m  FINDINGS:   Sleep Summary:   Total Recording Time (hours, min): 7 hours, 39 min  Total Sleep Time (hours, min):  2 hours, 10 min  Percent REM (%):    N/A    Respiratory Indices:   Calculated pAHI (per hour):  11.4/hour         REM pAHI:    N/A       NREM pAHI: N/A  Central pAHI: N/A  Oxygen  Saturation Statistics:    Oxygen  Saturation (%) Mean: 95%   Minimum oxygen  saturation (%):                 74%   O2 Saturation Range (%): 74-98%    O2 Saturation (minutes) <=88%: 1.6 min  Pulse Rate Statistics:   Pulse Mean (bpm):    54/min    Pulse Range (73-103/min)   IMPRESSION: OSA (obstructive sleep apnea)    RECOMMENDATION:  This home sleep test demonstrates evidence of obstructive sleep apnea but study was limited due to total sleep time of approximately 2 hours and inconsistent REM detection.  His sleep disordered breathing may very well be underestimated on this test.  Nevertheless, ongoing treatment with PAP therapy is recommended.  The patient carries a prior diagnosis of OSA and has a CPAP machine.  I recommend home AutoPap therapy for now with a pressure  setting of 7 to 14 cm, mask of choice, sized to fit. A full night, in-lab PAP titration study may aid in improving proper treatment settings and with mask fit, if needed, down the road.  Alternative treatments may include weight loss (where appropriate) along with avoidance of the supine sleep position (if possible), or an oral appliance in appropriate candidates.   Please note that untreated obstructive sleep apnea may carry additional perioperative morbidity. Patients with significant obstructive sleep apnea should receive perioperative PAP therapy and the surgeons and particularly the anesthesiologist should be informed of the diagnosis and the severity of the sleep disordered breathing. The patient should be cautioned not to drive, work at heights, or operate dangerous or heavy equipment when tired or sleepy. Review and reiteration of good sleep hygiene measures should be pursued with any patient. Other causes of the patient's symptoms, including circadian rhythm disturbances, an underlying mood disorder, medication effect and/or an underlying medical problem cannot be ruled out based on this test. Clinical correlation is recommended.  The patient and his referring provider will be notified of the test results. The patient will be seen in follow up in sleep clinic at [facility], as necessary.  I certify that I have reviewed the raw data recording prior to the issuance of this report in accordance with the standards of the American Academy of Sleep Medicine (AASM).  INTERPRETING PHYSICIAN:   True Mar, MD, PhD Medical Director, [facility]) Diplomat, ABPN (Neurology and Sleep)   [facility] [address] [place], [place] [address] [phone]                   "

## 2024-08-01 NOTE — Progress Notes (Signed)
 See procedure note.

## 2024-08-13 ENCOUNTER — Other Ambulatory Visit: Payer: Self-pay

## 2024-08-14 NOTE — Telephone Encounter (Signed)
 Pt wife called to request a call back to go over sleep study and next steps

## 2024-08-15 ENCOUNTER — Other Ambulatory Visit (HOSPITAL_COMMUNITY): Payer: Self-pay
